# Patient Record
Sex: Male | Born: 1935 | Race: White | Hispanic: No | Marital: Married | Smoking: Never smoker
Health system: Southern US, Community
[De-identification: ages and names within clinical notes are randomized; demographics above are authoritative.]

## PROBLEM LIST (undated history)

## (undated) DIAGNOSIS — D494 Neoplasm of unspecified behavior of bladder: Secondary | ICD-10-CM

## (undated) DIAGNOSIS — R0602 Shortness of breath: Secondary | ICD-10-CM

## (undated) DIAGNOSIS — F329 Major depressive disorder, single episode, unspecified: Secondary | ICD-10-CM

## (undated) DIAGNOSIS — I739 Peripheral vascular disease, unspecified: Secondary | ICD-10-CM

## (undated) DIAGNOSIS — C801 Malignant (primary) neoplasm, unspecified: Secondary | ICD-10-CM

## (undated) DIAGNOSIS — J189 Pneumonia, unspecified organism: Secondary | ICD-10-CM

## (undated) DIAGNOSIS — F419 Anxiety disorder, unspecified: Secondary | ICD-10-CM

## (undated) DIAGNOSIS — I4891 Unspecified atrial fibrillation: Secondary | ICD-10-CM

## (undated) DIAGNOSIS — T884XXA Failed or difficult intubation, initial encounter: Secondary | ICD-10-CM

## (undated) DIAGNOSIS — E7800 Pure hypercholesterolemia, unspecified: Secondary | ICD-10-CM

## (undated) DIAGNOSIS — F32A Depression, unspecified: Secondary | ICD-10-CM

## (undated) DIAGNOSIS — K219 Gastro-esophageal reflux disease without esophagitis: Secondary | ICD-10-CM

## (undated) DIAGNOSIS — M1990 Unspecified osteoarthritis, unspecified site: Secondary | ICD-10-CM

## (undated) DIAGNOSIS — K222 Esophageal obstruction: Secondary | ICD-10-CM

## (undated) DIAGNOSIS — H9190 Unspecified hearing loss, unspecified ear: Secondary | ICD-10-CM

## (undated) DIAGNOSIS — I10 Essential (primary) hypertension: Secondary | ICD-10-CM

## (undated) DIAGNOSIS — I2510 Atherosclerotic heart disease of native coronary artery without angina pectoris: Secondary | ICD-10-CM

## (undated) DIAGNOSIS — R609 Edema, unspecified: Secondary | ICD-10-CM

## (undated) DIAGNOSIS — I2699 Other pulmonary embolism without acute cor pulmonale: Secondary | ICD-10-CM

## (undated) HISTORY — PX: SHOULDER SURGERY: SHX246

## (undated) HISTORY — DX: Essential (primary) hypertension: I10

## (undated) HISTORY — DX: Esophageal obstruction: K22.2

## (undated) HISTORY — DX: Other pulmonary embolism without acute cor pulmonale: I26.99

## (undated) HISTORY — PX: JOINT REPLACEMENT: SHX530

## (undated) HISTORY — DX: Pure hypercholesterolemia, unspecified: E78.00

## (undated) HISTORY — DX: Unspecified atrial fibrillation: I48.91

## (undated) HISTORY — PX: OTHER SURGICAL HISTORY: SHX169

## (undated) HISTORY — PX: TOTAL KNEE ARTHROPLASTY: SHX125

---

## 1998-04-06 ENCOUNTER — Inpatient Hospital Stay (HOSPITAL_COMMUNITY): Admission: EM | Admit: 1998-04-06 | Discharge: 1998-04-07 | Payer: Self-pay | Admitting: Orthopedic Surgery

## 1999-03-21 ENCOUNTER — Encounter: Admission: RE | Admit: 1999-03-21 | Discharge: 1999-03-21 | Payer: Self-pay | Admitting: Orthopedic Surgery

## 1999-03-21 ENCOUNTER — Encounter: Payer: Self-pay | Admitting: Orthopedic Surgery

## 1999-04-08 HISTORY — PX: PANCREAS SURGERY: SHX731

## 1999-12-23 ENCOUNTER — Ambulatory Visit (HOSPITAL_COMMUNITY): Admission: RE | Admit: 1999-12-23 | Discharge: 1999-12-23 | Payer: Self-pay | Admitting: *Deleted

## 2000-01-07 ENCOUNTER — Ambulatory Visit (HOSPITAL_COMMUNITY): Admission: RE | Admit: 2000-01-07 | Discharge: 2000-01-07 | Payer: Self-pay | Admitting: *Deleted

## 2000-01-07 ENCOUNTER — Encounter (INDEPENDENT_AMBULATORY_CARE_PROVIDER_SITE_OTHER): Payer: Self-pay | Admitting: *Deleted

## 2000-02-03 ENCOUNTER — Ambulatory Visit (HOSPITAL_COMMUNITY): Admission: RE | Admit: 2000-02-03 | Discharge: 2000-02-03 | Payer: Self-pay | Admitting: Oncology

## 2000-02-03 ENCOUNTER — Encounter (HOSPITAL_COMMUNITY): Payer: Self-pay | Admitting: Oncology

## 2000-03-27 ENCOUNTER — Encounter: Payer: Self-pay | Admitting: Surgery

## 2000-04-06 ENCOUNTER — Inpatient Hospital Stay (HOSPITAL_COMMUNITY): Admission: RE | Admit: 2000-04-06 | Discharge: 2000-04-19 | Payer: Self-pay | Admitting: Surgery

## 2000-04-06 ENCOUNTER — Encounter (INDEPENDENT_AMBULATORY_CARE_PROVIDER_SITE_OTHER): Payer: Self-pay | Admitting: Specialist

## 2000-04-09 ENCOUNTER — Encounter (HOSPITAL_BASED_OUTPATIENT_CLINIC_OR_DEPARTMENT_OTHER): Payer: Self-pay | Admitting: Internal Medicine

## 2000-04-10 ENCOUNTER — Encounter (HOSPITAL_BASED_OUTPATIENT_CLINIC_OR_DEPARTMENT_OTHER): Payer: Self-pay | Admitting: Internal Medicine

## 2000-04-16 ENCOUNTER — Encounter (HOSPITAL_BASED_OUTPATIENT_CLINIC_OR_DEPARTMENT_OTHER): Payer: Self-pay | Admitting: Internal Medicine

## 2000-04-17 ENCOUNTER — Encounter (HOSPITAL_BASED_OUTPATIENT_CLINIC_OR_DEPARTMENT_OTHER): Payer: Self-pay | Admitting: Internal Medicine

## 2000-04-29 ENCOUNTER — Encounter: Payer: Self-pay | Admitting: General Surgery

## 2000-04-29 ENCOUNTER — Inpatient Hospital Stay (HOSPITAL_COMMUNITY): Admission: EM | Admit: 2000-04-29 | Discharge: 2000-05-05 | Payer: Self-pay | Admitting: Emergency Medicine

## 2000-04-29 ENCOUNTER — Encounter: Payer: Self-pay | Admitting: Emergency Medicine

## 2000-05-04 ENCOUNTER — Encounter: Payer: Self-pay | Admitting: Surgery

## 2000-12-29 ENCOUNTER — Ambulatory Visit (HOSPITAL_COMMUNITY): Admission: RE | Admit: 2000-12-29 | Discharge: 2000-12-29 | Payer: Self-pay | Admitting: *Deleted

## 2001-01-01 ENCOUNTER — Encounter: Admission: RE | Admit: 2001-01-01 | Discharge: 2001-01-01 | Payer: Self-pay | Admitting: Surgery

## 2001-01-01 ENCOUNTER — Encounter: Payer: Self-pay | Admitting: Surgery

## 2001-01-01 ENCOUNTER — Ambulatory Visit (HOSPITAL_COMMUNITY): Admission: RE | Admit: 2001-01-01 | Discharge: 2001-01-01 | Payer: Self-pay | Admitting: Surgery

## 2001-03-29 ENCOUNTER — Encounter: Admission: RE | Admit: 2001-03-29 | Discharge: 2001-03-29 | Payer: Self-pay | Admitting: Surgery

## 2001-03-29 ENCOUNTER — Encounter: Payer: Self-pay | Admitting: Surgery

## 2001-04-09 ENCOUNTER — Encounter: Payer: Self-pay | Admitting: Surgery

## 2001-04-09 ENCOUNTER — Ambulatory Visit (HOSPITAL_COMMUNITY): Admission: RE | Admit: 2001-04-09 | Discharge: 2001-04-09 | Payer: Self-pay | Admitting: Surgery

## 2001-04-09 ENCOUNTER — Encounter (INDEPENDENT_AMBULATORY_CARE_PROVIDER_SITE_OTHER): Payer: Self-pay | Admitting: *Deleted

## 2002-01-21 ENCOUNTER — Encounter (INDEPENDENT_AMBULATORY_CARE_PROVIDER_SITE_OTHER): Payer: Self-pay | Admitting: Gastroenterology

## 2002-03-07 ENCOUNTER — Encounter: Admission: RE | Admit: 2002-03-07 | Discharge: 2002-03-07 | Payer: Self-pay | Admitting: Surgery

## 2002-03-07 ENCOUNTER — Encounter: Payer: Self-pay | Admitting: Surgery

## 2002-11-16 ENCOUNTER — Encounter: Admission: RE | Admit: 2002-11-16 | Discharge: 2002-11-16 | Payer: Self-pay | Admitting: Internal Medicine

## 2002-11-16 ENCOUNTER — Encounter: Payer: Self-pay | Admitting: Internal Medicine

## 2003-03-10 ENCOUNTER — Encounter: Admission: RE | Admit: 2003-03-10 | Discharge: 2003-03-10 | Payer: Self-pay | Admitting: Surgery

## 2003-04-08 DIAGNOSIS — J189 Pneumonia, unspecified organism: Secondary | ICD-10-CM

## 2003-04-08 HISTORY — DX: Pneumonia, unspecified organism: J18.9

## 2004-01-13 ENCOUNTER — Inpatient Hospital Stay (HOSPITAL_COMMUNITY): Admission: EM | Admit: 2004-01-13 | Discharge: 2004-01-16 | Payer: Self-pay | Admitting: Emergency Medicine

## 2004-02-12 ENCOUNTER — Ambulatory Visit: Payer: Self-pay | Admitting: Gastroenterology

## 2004-03-11 ENCOUNTER — Encounter: Admission: RE | Admit: 2004-03-11 | Discharge: 2004-03-11 | Payer: Self-pay | Admitting: Surgery

## 2004-06-22 ENCOUNTER — Ambulatory Visit: Payer: Self-pay | Admitting: Gastroenterology

## 2004-06-22 ENCOUNTER — Encounter (INDEPENDENT_AMBULATORY_CARE_PROVIDER_SITE_OTHER): Payer: Self-pay | Admitting: Gastroenterology

## 2004-06-22 ENCOUNTER — Ambulatory Visit (HOSPITAL_COMMUNITY): Admission: RE | Admit: 2004-06-22 | Discharge: 2004-06-22 | Payer: Self-pay | Admitting: Gastroenterology

## 2004-07-03 ENCOUNTER — Ambulatory Visit: Payer: Self-pay | Admitting: Gastroenterology

## 2004-07-10 ENCOUNTER — Ambulatory Visit: Payer: Self-pay | Admitting: Gastroenterology

## 2004-07-10 ENCOUNTER — Ambulatory Visit (HOSPITAL_COMMUNITY): Admission: RE | Admit: 2004-07-10 | Discharge: 2004-07-10 | Payer: Self-pay | Admitting: Gastroenterology

## 2004-12-23 ENCOUNTER — Emergency Department (HOSPITAL_COMMUNITY): Admission: EM | Admit: 2004-12-23 | Discharge: 2004-12-24 | Payer: Self-pay | Admitting: Emergency Medicine

## 2005-04-07 HISTORY — PX: LUNG SURGERY: SHX703

## 2005-06-09 ENCOUNTER — Encounter: Admission: RE | Admit: 2005-06-09 | Discharge: 2005-06-09 | Payer: Self-pay | Admitting: Internal Medicine

## 2005-06-17 ENCOUNTER — Ambulatory Visit: Payer: Self-pay | Admitting: Internal Medicine

## 2005-06-24 ENCOUNTER — Encounter (INDEPENDENT_AMBULATORY_CARE_PROVIDER_SITE_OTHER): Payer: Self-pay | Admitting: *Deleted

## 2005-06-24 ENCOUNTER — Ambulatory Visit: Payer: Self-pay | Admitting: Internal Medicine

## 2005-06-24 ENCOUNTER — Other Ambulatory Visit: Admission: RE | Admit: 2005-06-24 | Discharge: 2005-06-24 | Payer: Self-pay | Admitting: Internal Medicine

## 2005-07-09 ENCOUNTER — Ambulatory Visit: Payer: Self-pay | Admitting: Internal Medicine

## 2005-07-29 ENCOUNTER — Encounter: Admission: RE | Admit: 2005-07-29 | Discharge: 2005-07-29 | Payer: Self-pay | Admitting: Thoracic Surgery

## 2005-08-04 ENCOUNTER — Encounter (INDEPENDENT_AMBULATORY_CARE_PROVIDER_SITE_OTHER): Payer: Self-pay | Admitting: *Deleted

## 2005-08-04 ENCOUNTER — Inpatient Hospital Stay (HOSPITAL_COMMUNITY): Admission: RE | Admit: 2005-08-04 | Discharge: 2005-08-12 | Payer: Self-pay | Admitting: Thoracic Surgery

## 2005-08-19 ENCOUNTER — Encounter: Admission: RE | Admit: 2005-08-19 | Discharge: 2005-08-19 | Payer: Self-pay | Admitting: Thoracic Surgery

## 2005-09-10 ENCOUNTER — Encounter: Admission: RE | Admit: 2005-09-10 | Discharge: 2005-09-10 | Payer: Self-pay | Admitting: Thoracic Surgery

## 2005-10-07 ENCOUNTER — Encounter: Admission: RE | Admit: 2005-10-07 | Discharge: 2005-10-07 | Payer: Self-pay | Admitting: Thoracic Surgery

## 2006-01-06 ENCOUNTER — Encounter: Admission: RE | Admit: 2006-01-06 | Discharge: 2006-01-06 | Payer: Self-pay | Admitting: Thoracic Surgery

## 2006-04-07 DIAGNOSIS — I2699 Other pulmonary embolism without acute cor pulmonale: Secondary | ICD-10-CM

## 2006-04-07 HISTORY — PX: CARDIAC CATHETERIZATION: SHX172

## 2006-04-07 HISTORY — DX: Other pulmonary embolism without acute cor pulmonale: I26.99

## 2006-04-08 ENCOUNTER — Ambulatory Visit: Payer: Self-pay | Admitting: Gastroenterology

## 2006-07-08 ENCOUNTER — Ambulatory Visit: Payer: Self-pay | Admitting: Thoracic Surgery

## 2006-07-08 ENCOUNTER — Encounter: Admission: RE | Admit: 2006-07-08 | Discharge: 2006-07-08 | Payer: Self-pay | Admitting: Thoracic Surgery

## 2006-07-29 IMAGING — CR DG CHEST 2V
2 series · 2 of 2 positions shown · non-contrast
Comparison: 08/19/05.

CLINICAL DATA: Pleural effusion, follow-up.
 CHEST X-RAY:

[view not recorded (1 of 2)]
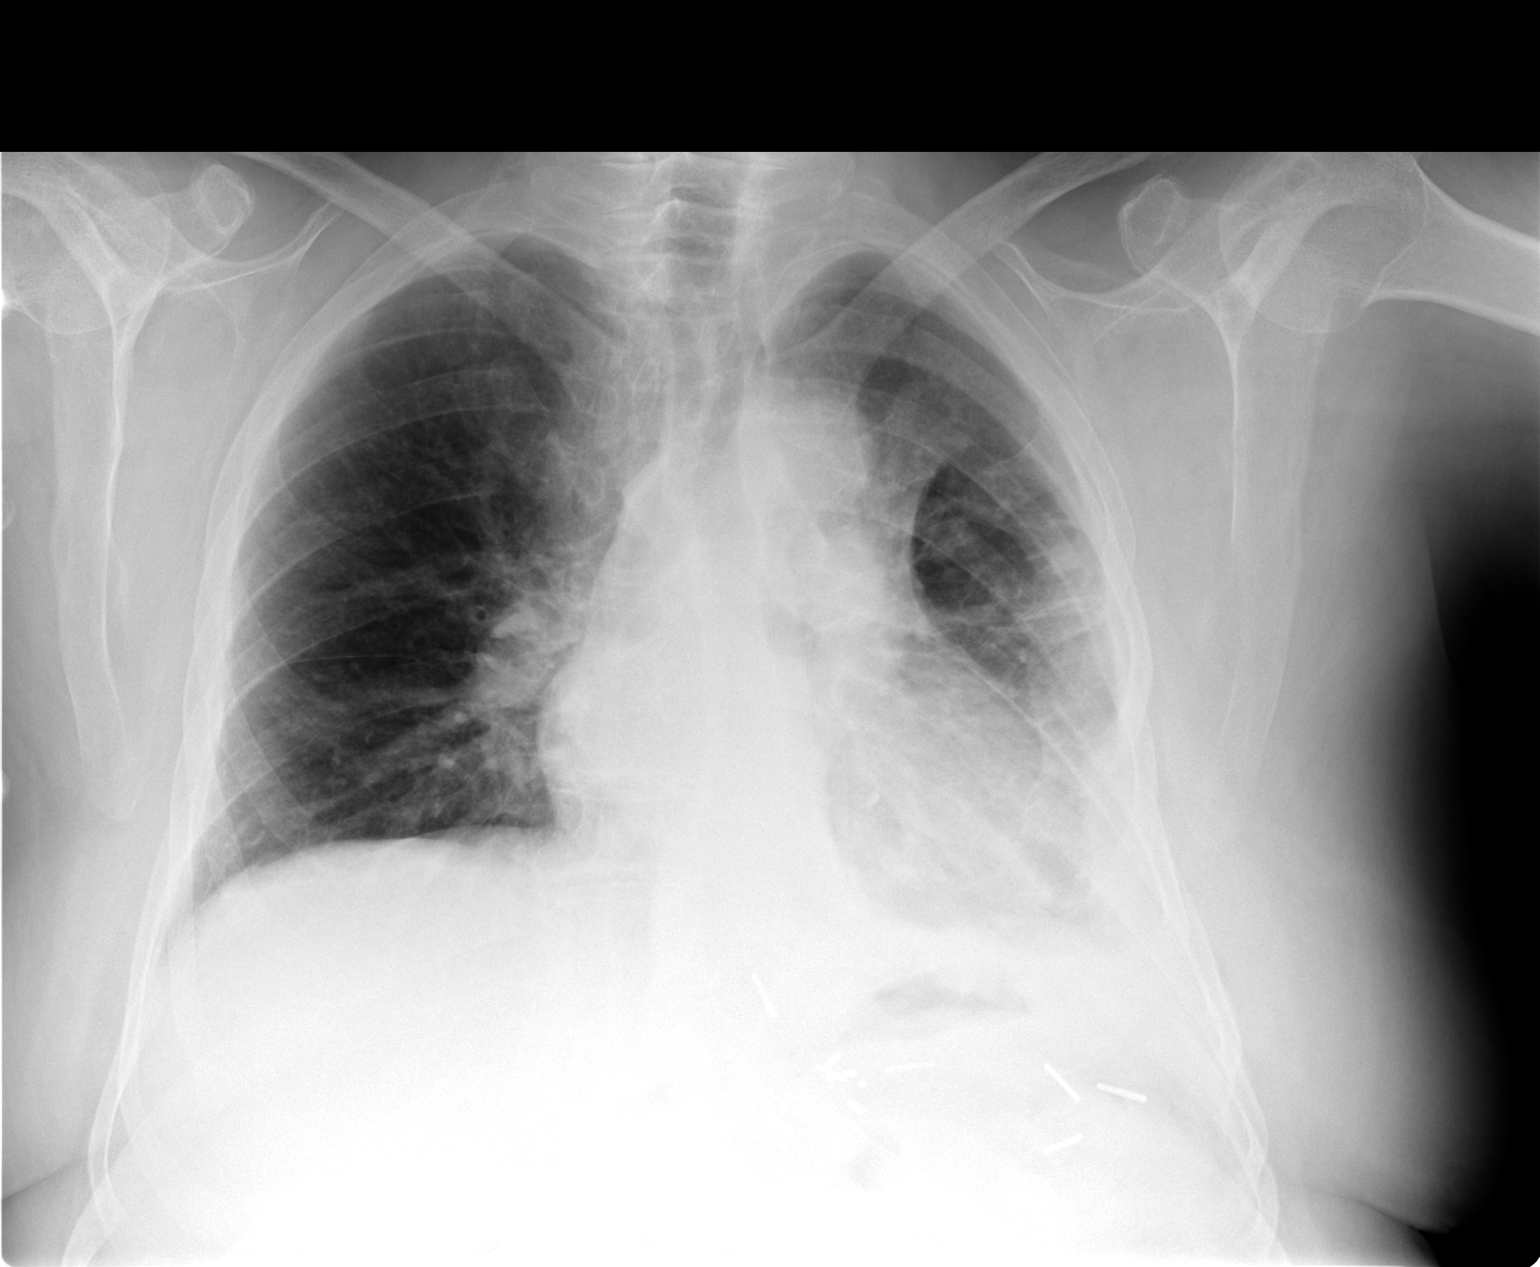

[view not recorded (2 of 2)]
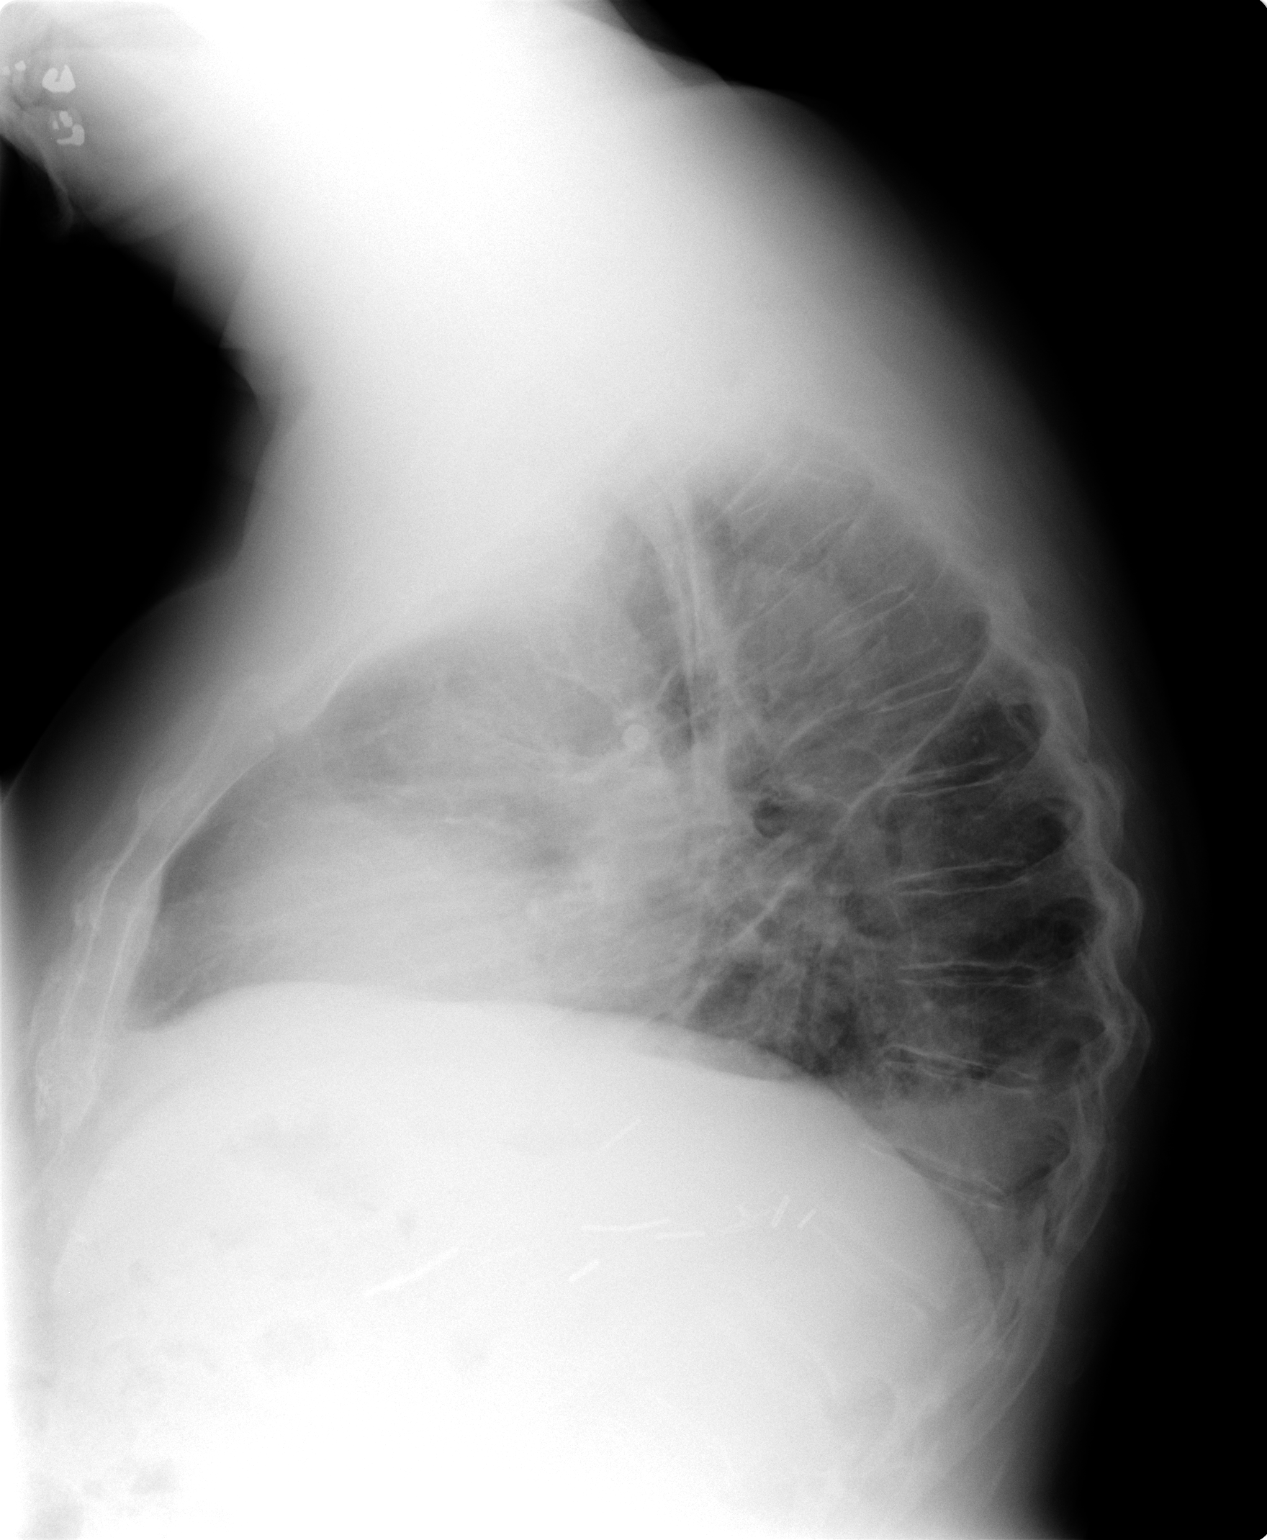

[2 of 2 positions shown; findings below may reference images not displayed]

The pleural-based opacity at the left lung base is decreasing.  Pleuroparenchymal changes on the left remain postoperatively. The right lung is clear.  Heart size is stable.
IMPRESSION: Decreasing pleural-based opacity at the left base.  Otherwise stable pleuroparenchymal changes on the left postoperatively.

## 2006-12-13 ENCOUNTER — Encounter: Payer: Self-pay | Admitting: Emergency Medicine

## 2006-12-14 ENCOUNTER — Encounter (INDEPENDENT_AMBULATORY_CARE_PROVIDER_SITE_OTHER): Payer: Self-pay | Admitting: Internal Medicine

## 2006-12-14 ENCOUNTER — Inpatient Hospital Stay (HOSPITAL_COMMUNITY): Admission: EM | Admit: 2006-12-14 | Discharge: 2006-12-18 | Payer: Self-pay | Admitting: Emergency Medicine

## 2007-02-22 ENCOUNTER — Ambulatory Visit: Payer: Self-pay | Admitting: Gastroenterology

## 2007-04-30 ENCOUNTER — Encounter: Admission: RE | Admit: 2007-04-30 | Discharge: 2007-04-30 | Payer: Self-pay | Admitting: Orthopedic Surgery

## 2007-05-12 DIAGNOSIS — E785 Hyperlipidemia, unspecified: Secondary | ICD-10-CM

## 2007-05-12 DIAGNOSIS — C649 Malignant neoplasm of unspecified kidney, except renal pelvis: Secondary | ICD-10-CM | POA: Insufficient documentation

## 2007-05-12 DIAGNOSIS — I10 Essential (primary) hypertension: Secondary | ICD-10-CM | POA: Insufficient documentation

## 2007-05-12 DIAGNOSIS — E119 Type 2 diabetes mellitus without complications: Secondary | ICD-10-CM

## 2007-05-12 DIAGNOSIS — K2990 Gastroduodenitis, unspecified, without bleeding: Secondary | ICD-10-CM

## 2007-05-12 DIAGNOSIS — J90 Pleural effusion, not elsewhere classified: Secondary | ICD-10-CM | POA: Insufficient documentation

## 2007-05-12 DIAGNOSIS — K2970 Gastritis, unspecified, without bleeding: Secondary | ICD-10-CM | POA: Insufficient documentation

## 2007-05-12 DIAGNOSIS — T18108A Unspecified foreign body in esophagus causing other injury, initial encounter: Secondary | ICD-10-CM | POA: Insufficient documentation

## 2007-05-12 DIAGNOSIS — K209 Esophagitis, unspecified without bleeding: Secondary | ICD-10-CM | POA: Insufficient documentation

## 2007-05-12 DIAGNOSIS — K5730 Diverticulosis of large intestine without perforation or abscess without bleeding: Secondary | ICD-10-CM | POA: Insufficient documentation

## 2007-05-12 DIAGNOSIS — D126 Benign neoplasm of colon, unspecified: Secondary | ICD-10-CM | POA: Insufficient documentation

## 2007-05-12 DIAGNOSIS — C259 Malignant neoplasm of pancreas, unspecified: Secondary | ICD-10-CM | POA: Insufficient documentation

## 2007-05-12 DIAGNOSIS — K219 Gastro-esophageal reflux disease without esophagitis: Secondary | ICD-10-CM | POA: Insufficient documentation

## 2007-05-12 DIAGNOSIS — K222 Esophageal obstruction: Secondary | ICD-10-CM

## 2007-05-12 DIAGNOSIS — K449 Diaphragmatic hernia without obstruction or gangrene: Secondary | ICD-10-CM | POA: Insufficient documentation

## 2007-05-12 DIAGNOSIS — M1990 Unspecified osteoarthritis, unspecified site: Secondary | ICD-10-CM | POA: Insufficient documentation

## 2008-01-25 ENCOUNTER — Telehealth: Payer: Self-pay | Admitting: Gastroenterology

## 2008-03-13 ENCOUNTER — Ambulatory Visit: Payer: Self-pay | Admitting: Internal Medicine

## 2008-03-13 DIAGNOSIS — J9610 Chronic respiratory failure, unspecified whether with hypoxia or hypercapnia: Secondary | ICD-10-CM

## 2008-04-13 ENCOUNTER — Ambulatory Visit: Payer: Self-pay | Admitting: Internal Medicine

## 2009-02-13 ENCOUNTER — Encounter: Payer: Self-pay | Admitting: Gastroenterology

## 2009-12-31 ENCOUNTER — Encounter: Payer: Self-pay | Admitting: Gastroenterology

## 2010-05-09 NOTE — Miscellaneous (Signed)
  Clinical Lists Changes Prescription renal notice for Prevacid 30 mg tablets one tablet daily #90 with signs and symptoms to the patient  Appended Document:     Clinical Lists Changes  Medications: Changed medication from PREVACID 30 MG CPDR (LANSOPRAZOLE) 1 tablet by mouth once daily every morning before breakfast to PREVACID 30 MG CPDR (LANSOPRAZOLE) 1 tablet by mouth once daily every morning before breakfast - Signed Rx of PREVACID 30 MG CPDR (LANSOPRAZOLE) 1 tablet by mouth once daily every morning before breakfast;  #90 x 3;  Signed;  Entered by: Merri Ray CMA (AAMA);  Authorized by: Louis Meckel MD;  Method used: Print then Give to Patient    Prescriptions: PREVACID 30 MG CPDR (LANSOPRAZOLE) 1 tablet by mouth once daily every morning before breakfast  #90 x 3   Entered by:   Merri Ray CMA (AAMA)   Authorized by:   Louis Meckel MD   Signed by:   Merri Ray CMA (AAMA) on 12/31/2009   Method used:   Print then Give to Patient   RxID:   [id number]

## 2010-08-20 NOTE — Assessment & Plan Note (Signed)
[facility]                         GASTROENTEROLOGY OFFICE NOTE   Frederick [name], Frederick [name]                     MRN:          [id number]  DATE:02/22/2007                            DOB:          04-14-1929    PROBLEM:  Esophageal stricture.   Frederick [name] has returned for scheduled GI followup.  He has a history  of an esophageal stricture which has been dilated in the past.  He has  had food impactions as well.  On Prevacid, he has been symptom-free.  With careful chewing of his food, he has had no further problems.  He is  now oxygen dependent because of his COPD.  He has a history of a pleural  effusion and underwent what sounds like a VATS procedure.   MEDICATIONS:  Include Celebrex, Lipitor, Lasix, hydrocodone, Benicar,  Glucotrol, potassium, amitriptyline, chlordiazepoxide, Imdur, pindolol,  Norvasc, insulin, Coumadin, metolazone, Prevacid, and meclizine.  He is  allergic to DILAUDID and MORPHINE.   EXAMINATION:  Pulse 62, blood pressure 120/70, weight 231.   IMPRESSION:  1. History of esophageal motility disorder and stricture.  Currently      asymptomatic.  2. Gastroesophageal reflux disease.  3. Chronic obstructive pulmonary disease, oxygen-requiring chronic      obstructive pulmonary disease.  4. History of pancreatic islet cell tumor.  5. Diabetes.   RECOMMENDATIONS:  1. Continue PPI therapy, which he should use indefinitely.  2. Repeat dilatation as needed.     Barbette Hair. Arlyce Dice, MD,FACG  Electronically Signed    RDK/MedQ  DD: 02/22/2007  DT: 02/23/2007  Job #: [id number]

## 2010-08-20 NOTE — H&P (Signed)
NAME:  Frederick [name], Frederick [name]            ACCOUNT NO.:  [id number]   MEDICAL RECORD NO.:  [id number]          PATIENT TYPE:  INP   LOCATION:  3733                         FACILITY:  [facility]   PHYSICIAN:  Wilson Singer, M.D.DATE OF BIRTH:  Aug 16, 1929   DATE OF ADMISSION:  12/14/2006  DATE OF DISCHARGE:                              HISTORY & PHYSICAL   HISTORY:  This is a 77 year old obese man who gives a 2 to 3 day history  of feeling weak and short of breath.  He had accompanied his wife to his  wife's doctor's appointment today and whilst in the waiting room he  became somewhat drowsy and apparently he had desaturated when they  checked his oxygen level.  His oxygen had been started only a few days  ago by his primary care physician because he was hypoxic.  He was  therefore sent to the hospital for further evaluation.   PAST MEDICAL HISTORY:  1. Hypertension.  2. Insulin dependent diabetes mellitus.  3. Hiatal hernia.  4. Gastroesophageal reflux disease.  5. History of esophageal stricture.  6. History of benign colonic polyps.  7. History of left pleural effusion treated with left thoracotomy and      left lung decortication in April 2007.   PAST SURGICAL HISTORY:  1. Status post resection of islet cell pancreatic tumor in 2002.  2. Status post splenectomy 2002 at the same operation.  3. Bilateral total knee replacements 1988.  4. Bilateral shoulder surgery for rotator cuff problems 1992.   SOCIAL HISTORY:  Has been married for 55 years.  He does not smoke and  does not drink alcohol.  He is retired.   MEDICATIONS:  1. Elavil 50 mg nightly.  2. Aspirin 325 mg daily.  3. Benicar 40 mg daily.  4. Celebrex 100 mg b.i.d.  5. Furosemide 40 mg daily.  6. Glipizide 10 mg b.i.d.  7. Imdur 60 mg daily.  8. Lantus insulin 44 units daily.  9. Librium 10 mg t.i.d.  10.Lipitor 40 mg daily.  11.Pindolol 5 mg daily.  12.Norvasc 5 mg daily.  13.Potassium chloride 20 mEq daily.  14.Humalog insulin as needed per sliding scale.   ALLERGIES:  DILAUDID AND MORPHINE.   REVIEW OF SYSTEMS:  Apart from the systems mentioned above, there are no  other symptoms referable to all systems reviewed.   PHYSICAL EXAMINATION:  VITAL SIGNS:  Temperature 97.2, blood pressure  89/43, pulse 50, saturation 92% on 4 liters of oxygen, respiratory rate  14.  GENERAL:  The patient appears to be drowsy intermittently.  CARDIOVASCULAR:  Heart sounds are present to normal without any murmurs.  He has a resting bradycardia and appears to be in first degree heart  block.  RESPIRATORY:  Lung fields are clear.  Clinically there is no pleural  rub.  ABDOMEN:  Soft, nontender, with no hepatomegaly.  NEUROLOGICAL:  He is alert intermittently.  He does answer questions  appropriately when he is awake.  There is no obvious focal neurological  signs.   INVESTIGATIONS:  Sodium 137, potassium 4.4, BUN 29, chloride 102,  glucose 167, creatinine 1.1, troponin  less than 0.05.  D-dimer is  significantly elevated at 10.24, BNP 401.   IMPRESSION:  1. Hypoxemia possible pulmonary embolism.  2. Hypotension.  3. Bradycardia.  4. Insulin dependent diabetes mellitus.  5. Hypertension.  6. History of islet cell tumor resection.   PLAN:  1. Admit.  2. CT chest angiogram.  3. Intravenous fluids.  4. Hold beta blocker.  5. Control diabetes mellitus.   Further recommendations will depend on patient's hospital progress.      Wilson Singer, M.D.  Electronically Signed     NCG/MEDQ  D:  12/14/2006  T:  12/14/2006  Job:  [id number]   cc:   Georgianne Fick, M.D.

## 2010-08-23 NOTE — Op Note (Signed)
NAMEKENNEITH, STIEF            ACCOUNT NO.:  [id number]   MEDICAL RECORD NO.:  [id number]          PATIENT TYPE:  INP   LOCATION:  3301                         FACILITY:  [facility]   PHYSICIAN:  Ines Bloomer, M.D. DATE OF BIRTH:  1930-02-20   DATE OF PROCEDURE:  08/04/2005  DATE OF DISCHARGE:                                 OPERATIVE REPORT   PREOPERATIVE DIAGNOSIS:  Chronic left pleural effusion, rule out cancer.   POSTOPERATIVE DIAGNOSIS:  Fibrothorax.   OPERATION PERFORMED:  Left video-assisted thoracoscopic surgery, left  thoracotomy with decortication.   SURGEON:  Ines Bloomer, M.D.   FIRST ASSISTANT:  Pecola Leisure, P.A.-C.   General anesthesia.   After percutaneous insertion of all monitoring lines, the patient underwent  general anesthesia.  He was prepped and draped in the usual sterile manner,  turned to the left lateral thoracotomy position.  A dual-lumen tube was  inserted.  The left lung was deflated.  Two trocar sites were made in the  anterior and posterior axillary line at the seventh intercostal space and  two trocars were inserted.  A 0 degree scope was inserted.  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  We did multiple  biopsies to rule out any cancer and that just came back inflammatory, so a  posterolateral thoracotomy incision was made over the sixth intercostal  space with the latissimus being partially divided, the serratus reflected  anteriorly, and the sixth intercostal space entered.  Two Tuffiers were  placed at right angles.  We started dissecting off the thickened 2-3 mm  parietal pleura superiorly, then inferiorly and then medially.  The left  upper lobe was fairly easily, except for the lingula, dissected off the  parietal pleura and the thickened pleura was removed.  Then the lingula had  to be decorticated, stripping off the  thickened visceral pleura off of the  lingula with sharp and blunt dissection using Kitners and forceps to remove  it.  Then attention was turned to the left lower lobe, where there was  markedly thickened parietal and visceral pleura.  The parietal pleura was  dissected free down to the diaphragm, then superiorly, and the lung was  dissected off the diaphragm and the thickened parietal pleura was removed  laterally and then it was removed medially off the pericardium.  Then the  thickened visceral pleura was removed off the left lower lobe, again with  sharp and blunt dissection using Kitners and scissors and forceps until the  entire visceral pleura was stripped off the left lower lobe.  Then one area  of a tear on the lung was sutured with 3-0 Vicryl.  After this had been  done, two chest tubes were brought through the trocar sites, a straight 36  and a right-angled 36, and  sutured in place with 0 silk.  The chest was closed with four paracostals of  #1 Vicryl.  The lung was expanded and filled the space.  The muscle was  closed with #1  Vicryl also, the subcutaneous tissue with 2-0 Vicryl and  Ethicon skin clips.  The patient was returned to the recovery room in  serious condition.           ______________________________  Ines Bloomer, M.D.     DPB/MEDQ  D:  08/04/2005  T:  08/05/2005  Job:  [id number]   cc:   Casimiro Needle B. Sherene Sires, M.D. [facility]  [address]  [place]  [place] [address]

## 2010-08-23 NOTE — H&P (Signed)
[facility]  Patient:    Frederick [name], Frederick [name]                   MRN: [id number] Adm. Date:  [id number] Disc. Date: [id number] Attending:  Bonnetta Barry CC:         Lilia Pro, M.D.   History and Physical  CHIEF COMPLAINT:  Left upper quadrant abdominal pain and fever.  HISTORY OF PRESENT ILLNESS:  This is a 77 year old white man who recently underwent elective distal pancreatectomy and splenectomy on April 06, 2000, by Velora Heckler, M.D., for an islet cell tumor.  The patient did reasonably well and was discharged on April 19, 2000.  Prior to discharge, his white count was noted to be slightly elevated and a CT scan showed some benign-appearing fluid collections.  He now complains of headache for several days and left-sided abdominal pain for three to five days and a one to two-day history of fever up to 101.7 degrees.  He has had some mild chills, nothing severe.  His appetite has been diminished, but he has had normal bowel movements and no nausea or vomiting.  He denies cough, sputum production, or chest pain.  He noticed some redness and drainage from the wound just today.  He was evaluated by Donnetta Hutching, M.D., in the emergency department and I was asked to see him.  PAST MEDICAL HISTORY:  Diabetes mellitus, recent onset following his pancreatectomy.  Hypertension.  Gastroesophageal reflux disease and esophageal stricture.  Cardiac arrhythmias.  History of colon polyps.  PAST SURGICAL HISTORY:  History of bilateral total knee replacements in 1988. History of bilateral shoulder surgery in 1992.  Pancreatectomy on April 06, 2000.  CURRENT MEDICATIONS:  1. Actos one tablet p.o. q.d.  2. Amitriptyline 25 mg p.o. q.d.  3. Librium 10 mg p.o. b.i.d.  4. Prevacid 30 mg p.o. q.d.  5. Lotensin 20 mg p.o. q.d.  6. Meclozine p.r.n.  7. Lasix 40 mg q.a.m.  8. Lipitor 10 mg p.o. q.d.  9. Celebrex 100 mg b.i.d. 10. Quinidine 200 mg  q.i.d. 11. Vicodin p.r.n.  DRUG ALLERGIES:  MORPHINE.  FAMILY HISTORY:  For details, please see the recent admission note.  SOCIAL HISTORY:  For details, please see the recent admission note.  REVIEW OF SYSTEMS:  For details, please see the recent admission note.  PHYSICAL EXAMINATION:  A pleasant, overweight, white male in mild distress.  VITAL SIGNS:  Temperature 98.7 degrees, heart rate 81, respiratory rate 20, blood pressure 103/58.  HEENT:  Sclerae clear.  Extraocular movements intact.  NECK:  Supple.  Nontender.  No mass.  No adenopathy.  LUNGS:  Faint scattered rhonchi bilaterally.  Cough is nonproductive.  HEART:  Regular rate and rhythm with a faint systolic murmur.  ABDOMEN:  Obese and soft with active bowel sounds.  Bilateral subcostal scar. Looks well healed, but in the left upper quadrant there is faint erythema and moderate induration.  With sterile prep, this wound was opened laterally for about 1 inch and probed and explored all the way down to the fascia.  I did not find an abscess.  A culture was taken.  This was redressed.  GENITALIA:  The inguinal area was normal.  EXTREMITIES:  No edema.  NEUROLOGIC:  Grossly within normal limits.  ADMISSION LABORATORY DATA:  The chest x-ray looks fairly normal, but there is a question of a faint left lower lobe infiltrate behind the heart.  White blood cell count 16,000, hemoglobin 10.3.  Glucose 286, potassium 4.1, creatinine 1.1, calcium 9.0.  Liver function tests normal.  IMPRESSION: 1. Cellulitis of the abdominal wall, rule out left upper quadrant or left    subphrenic abscess. 2. Recent distal pancreatectomy and splenectomy for islet cell tumor. 3. Diabetes mellitus. 4. Hypertension. 5. Gastroesophageal reflux disease. 6. Possible left lower lobe infiltrate.  PLAN:  The patient will be admitted and started on IV fluids and IV antibiotics.  A CT scan of the lower chest and abdomen will be performed now. We  will cover him with sliding scale insulin.  Lilia Pro, M.D., will be asked to see him in consultation regarding his multiple medical problems. DD:  04/29/00 TD:  04/29/00 Job: [id number]

## 2010-08-23 NOTE — Discharge Summary (Signed)
Frederick [name], Frederick [name]            ACCOUNT NO.:  [id number]   MEDICAL RECORD NO.:  [id number]          PATIENT TYPE:  INP   LOCATION:  2017                         FACILITY:  [facility]   PHYSICIAN:  Ines Bloomer, M.D. DATE OF BIRTH:  May 27, 1929   DATE OF ADMISSION:  08/04/2005  DATE OF DISCHARGE:  08/12/2005                                 DISCHARGE SUMMARY   PRIMARY ADMITTING DIAGNOSIS:  Left pleural effusion.   ADDITIONAL/DISCHARGE DIAGNOSES:  1.  Chronic left pleural effusion.  2.  Fibrothorax.  3.  Type 2 diabetes mellitus.  4.  Hypertension.  5.  History of hiatal hernia.  6.  Status post resection of an islet cell tumor.  7.  Postoperative blood loss anemia.   PROCEDURES PERFORMED:  1.  Left VATS.  2.  Left thoracotomy with left lung decortication.   HISTORY:  The patient is a 77 year old male who was status post resection of  an islet cell tumor several years ago.  He had done well until several  months ago, at which time he developed a left pleural effusion.  He has  continued to have some shortness of breath and anterior chest wall pain  since that time, and a CT scan was performed which showed recurrence of the  left pleural effusion with thickened pleura.  The previous tap showed a  bloody effusion was a lymphocytosis.  He was subsequently referred to Dr.  Dewayne Shorter for further evaluation and treatment.  Because of this recurrence  of his effusion and the thickened nature on CT scan, Dr. Edwyna Shell recommended  proceeding with a VATS with drainage of the effusion and biopsies at this  time.  He explained the risks, benefits and alternatives of the procedure to  the patient, and he agreed to proceed with surgery.   HOSPITAL COURSE:  Mr. Mahl was admitted to [facility] on August 04, 2005.  He was taken to the operating room where he underwent left VATS  plus thoracotomy and decortication.  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  The patient tolerated the procedure well and was  transferred to the floor in stable condition.  Postoperatively, he initially  ran low grade fevers and developed a leukocytosis with white blood cell  count up to 17,000.  He also had a productive cough and was started  empirically on IV Maxipime.  He was mobilized with physical therapy.  His  chest tubes were removed in a stepwise fashion once all drainage had  resolved and no evidence of pneumothorax was present on chest x-ray.  He has  been somewhat anemic postoperatively and has been started on supplemental  iron and folic acid.  He has been treated with aggressive pulmonary toilet  measures.  He is presently afebrile and all vital signs are stable.  He has  been weaned from supplemental oxygen and is maintaining O2 sats of greater  than 90% on room air.  He has been switched from IV antibiotics to p.o.  Ceftin and has completed 3 days  of a 5-day course.  He continues to have a  leukocytosis with a white blood cell count of 17,000.  His chest x-rays are  improving with bilateral atelectasis and mild edema.  His surgical incision  sites were all healing well, and he has no other signs on physical exam of  infection.  He is ambulating in the halls without difficulty.  The remainder  of his labs showed a hemoglobin of 10, hematocrit 31.2, platelets 554,  sodium 142, potassium 3.7 which has been supplemented, BUN 12, creatinine  0.9.  A CBC will be repeated on Aug 12, 2005 to recheck his white blood cell  count.  Overall, he is progressing well, and it is anticipated that if he  has no acute changes over the next 24-48 hours, he will hopefully be ready  for discharge home.   DISCHARGE MEDICATIONS:  1.  Nu-Iron 150 mg daily.  2.  Folic acid 1 mg daily.  3.  Ceftin 500 mg b.i.d. x2 days.  4.  Darvocet N 100 one to two q.4 h p.r.n. for pain.  5.  Lantus 36 units  daily.  6.  Prevacid 30 mg daily.  7.  Celebrex 100 mg b.i.d.  8.  Quinidine 200 mg b.i.d.  9.  Lipitor 20 mg daily.  10. Benicar 20 mg daily.  11. Glipizide 10 mg b.i.d.  12. Amitriptyline 25 mg 2 tablets q.h.s.  13. Lasix 40 mg q.a.m. and 20 mg q.p.m.  14. Potassium 20 mEq daily.  15. Librium 10 mg t.i.d.  16. Antivert p.r.n.  17. Sliding scale insulin as directed.   DISCHARGE INSTRUCTIONS:  He is asked to refrain from driving, heavy lifting  or strenuous activity.  He may continue ambulating daily and using his  incentive spirometer.  He may shower daily and clean his incisions with soap  and water.  He will continue his same preoperative diet.   DISCHARGE FOLLOWUP:  He will see Dr. Edwyna Shell back in the office in 1 week and  will have a chest x-ray at [facility] 1 hour prior to this  appointment.  He will contact our office in the interim if he experiences  any problems or has questions.      Coral Ceo, P.A.    ______________________________  Ines Bloomer, M.D.    GC/MEDQ  D:  08/11/2005  T:  08/12/2005  Job:  [id number]   cc:   [facility] Office   Casimiro Needle B. Sherene Sires, M.D. [facility]  [address]  [place]  [place] [address]   Georgianne Fick, M.D.  Fax: [phone]

## 2010-08-23 NOTE — Op Note (Signed)
[facility]  Patient:    Frederick [name], Frederick [name]                   MRN: [id number] Proc. Date: 04/06/00 Adm. Date:  [id number] Attending:  Bonnetta Barry CC:         Samul Dada, M.D.  Thomas B. Samuella Cota, M.D.  Lilia Pro, M.D.   Operative Report  PREOPERATIVE DIAGNOSIS:  Islet cell tumor of the pancreas.  POSTOPERATIVE DIAGNOSIS:  Islet cell tumor of the pancreas.  PROCEDURE:  Distal pancreatectomy and splenectomy.  SURGEON:  Velora Heckler, M.D.  ASSISTANT:  Donnie Coffin. Samuella Cota, M.D.  ANESTHESIA:  General with postoperative epidural.  ESTIMATED BLOOD LOSS:  1250 cc.  PREPARATION:  Betadine.  COMPLICATIONS:  None.  BLOOD PRODUCTS:  Two units packed red blood cells.  DRAINS:  #10 Jackson-Pratt drain.  INDICATIONS:  Patient is a 77 year old white male, who presents with an incidentally identified tumor of the pancreas.  Patient was undergoing a CT scan to evaluate for renal artery stenosis and hypertension.  Subsequent MRI scan was performed.  This showed a mass lesion in the tail of the pancreas. Needle aspiration was obtained, which was consistent with islet cell neoplasm of the pancreas.  A ______ scan was performed and was negative for metastatic disease.  Patient now comes to surgery for resection of pancreatic islet cell neoplasm.  DESCRIPTION OF PROCEDURE:  Procedure was done in O.R. #11 at the [facility].  Patient was brought to the operating room, placed in the supine position on the operating room table.  Following administration of general anesthesia, the patient was prepped and draped in usual strict aseptic fashion.  After ascertaining that an adequate level of anesthesia had been obtained, a left subcostal incision was made with a #10-blade.  Dissection was carried down through the subcutaneous tissues in the abdominal wall. Hemostasis was obtained with the electrocautery.  Peritoneal cavity  was entered cautiously.  Abdomen was explored.  There is an abundance of intraperitoneal and retroperitoneal adipose tissue.  No palpable lesions were identifiable in the spleen or liver.  Gallbladder was normal without stones. Stomach appears grossly normal and the nasogastric tube was properly positioned.  Small bowel was grossly normal.  There were some adhesions to the sigmoid colon representing mild underlying diverticular disease.  There were no mass lesions in the pelvis.  A Bookwalter retractor was placed to assist with exposure.   Dissection was begun by dividing the gastrocolic ligament between Kelly clamps and ligating with 2-0 silk ties.  This provided access to the lesser sac.  Adhesions of the stomach to the retroperitoneum were lysed with the electrocautery.  Spleen was mobilized from its lateral and posterior peritoneal attachments by incising with the electrocautery.  The splenic flexure of the colon was mobilized and vessels divided between [facility] clamps and ligated with 2-0 silk ties.  Spleen was further mobilized to use as a handle on the pedicle of the pancreas.  Tip of the pancreas was identified. Inferior margin of the pancreas was dissected out using the electrocautery to incise the peritoneum.  Vessels were divided between Kelly clamps and ligated with 2-0 silk ties and 2-0 silk suture ligatures.  The short gastric vessels were then divided between [facility] clamps, ligated with 2-0 silk ties, as well as large yellow ligaclips.  The entire stomach was freed from the spleen and retracted with the Bookwalter retractor.  Pancreas was better defined. Pancreas was quite thick with a  great amount of adipose tissue making it very difficult to appreciate the tumor.  Dissection was carried medially to the confluence of the splenic and mesenteric veins ______ vein.  The pancreatic pedicle was elevated.  The splenic artery was divided between [facility] clamps and doubly ligated with  2-0 silk ligatures, followed by a ligaclip.  The splenic vein was divided between large yellow ligaclips.  The body of the pancreas was transected using a TA-60, 4.8 mm stapler.  The pancreatic duct was then oversewn with 2-0 silk sutures.  The pancreatic specimen was incised on the backtable showing an approximate 2 cm neoplasm in the distal tail of the pancreas.  There was a good surgical margin.  Specimen was submitted being the distal pancreas and spleen together to Dr. Jimmy Picket, who on frozen section identified the tumor as an islet cell tumor, likely benign with negative surgical margins.  Good hemostasis was obtained throughout the wound. Surgicel was placed over the tail of the pancreas and in the pancreatic bed. Left upper quadrant was copiously irrigated with warm saline, which was evacuated.  A #10 Jackson-Pratt drain was placed in the pancreatic bed with the tip at the stump of the transected pancreas.  Drain was secured to the skin of the left abdominal wall with a 3-0 nylon suture.  All packs were removed and counts were correct.  Good hemostasis was noted.  The abdominal wall was closed in two layers with running #1 Novofil.  Subcutaneous tissues were irrigated.  Skin was closed with stainless steel staples.  Drain was placed to bulb suction.  Sterile occlusive dressings were applied.  The patient was awakened from anesthesia and brought to the recovery room in stable condition.  The patient tolerated the procedure well. DD:  04/06/00 TD:  04/06/00 Job: [id number]

## 2010-08-23 NOTE — Discharge Summary (Signed)
[facility]  Patient:    Frederick [name], Frederick [name]                   MRN: [id number] Adm. Date:  [id number] Disc. Date: [id number] Attending:  Bonnetta Barry                           Discharge Summary  REASON FOR ADMISSION:  Left upper quadrant abdominal pain and fever.  HISTORY OF PRESENT ILLNESS:  The patient is a 77 year old white male who underwent distal pancreatectomy, splenectomy on April 06, 2000, for islet cell tumor of the pancreas.  The patient is readmitted at this time for development over the past three to five days of left upper quadrant abdominal pain and fever.  His highest temperature at home was 101.7 degrees.  The patient presented to the emergency department, where general surgery was consulted.  My partner, Dr. Claud Kelp, saw the patient and admitted him to the surgical service.  HOSPITAL COURSE:  The patient was seen in consultation by Dr. Lilia Pro, his primary care physician from [facility].  The patient was started on intravenous antibiotics.  He underwent CT scan of the abdomen which showed some inflammatory changes in the left upper quadrant and a subcutaneous abscess.  The left lateral wound was opened by Dr. Derrell Lolling, and dressing changes were begun.  The patient was treated with intravenous Unasyn. However, low-grade fever and leukocytosis persisted.  On May 01, 2000, the wound was opened widely at the bedside with drainage of purulent material. The wound was packed and allowed to heal by secondary intention.  Intravenous antibiotics were continued.  The patient had resolution of his fever and a decrease in his white blood cell count over the ensuing 48 hours.  His appetite returned.  His glucose control improved.  The patient continued to make gradual progress and was prepared for discharge home on May 05, 2000.  DISCHARGE PLAN:  The patient is discharged home May 05, 2000, in  good condition, tolerating a regular diet, and ambulating independently.  FOLLOW-UP:  He will be seen back in my office at [facility] in one week.  He will have home health nurses twice daily for dressing changes.  DISCHARGE MEDICATIONS:  Augmentin 875 mg b.i.d. for 10 days.  Vicodin for pain.  Potassium chloride daily.  Other home medications as per usual including his morning insulin administration under the direction of Dr. Johnsie Kindred.  FINAL DIAGNOSIS:  Wound infection with subcutaneous abscess.  CONDITION ON DISCHARGE:  Improved. DD:  05/22/00 TD:  05/23/00 Job: [id number]

## 2010-08-23 NOTE — Discharge Summary (Signed)
[facility]  Patient:    Frederick [name], Frederick [name]                   MRN: [id number] Adm. Date:  [id number] Disc. Date: [id number] Attending:  Bonnetta Barry CC:         Lilia Pro, M.D.   Discharge Summary  REASON FOR ADMISSION:  Pancreatic outlet cell tumor.  BRIEF HISTORY:  The patient is a 77 year old white male who presented with outlet cell tumor of the pancreas to our practice for surgical resection at the request of Dr. Dorinda Hill Murinson and Dr. Lilia Pro. The patient had his tumor found incidentally at the time of CT scan to evaluate for renal artery stenosis and hypertension. He subsequently underwent MRI and then a CT guided fine needle biopsy. Pathology demonstrated findings consistent with outlet cell neoplasm of the pancreas. Octreotide scan was performed and was negative for metastatic disease. The patient now comes to surgery for resection of pancreatic neoplasm.  HOSPITAL COURSE:  The patient was admitted and taken to the operating room on April 06, 2000. He underwent exploratory laparotomy with distal pancreatectomy and splenectomy. Postoperative course was complicated by onset of diabetes and persistence of his hypertension. The patient was seen in consultation by Dr. Lilia Pro who managed each of these medical issues during his remaining hospitalization. The patient made gradual progress. The Jackson-Pratt drain remained in place postoperatively drainage serosanguineous fluid. The patient had slow resolution of his ileus. He required aggressive pulmonary toilet. The patient had a fever on the third postoperative day with elevation of his white blood cell count. He had Gram stain of his sputum showing possible strep. The patient was treated appropriately with intravenous vancomycin per the medical consultant. The patient continued to make slow but steady progress or gradual resolution of his ileus. He was started on a  clear liquid diet on the four postoperative day. The patient continued to have elevation of his white blood cell count. He was felt to have pneumonia and bronchitis. He was treated aggressively with intravenous antibiotics with a slow decrease in his white blood cell count. He remained on sliding scale insulin per the medical consultant. Over the ensuing several days, the patients diet slowly advanced and he began tolerating a regular diet by postoperative day seven. He underwent repeat CT scan of the abdomen which did not demonstrate any evidence of abscess formation. His Jackson-Pratt drain was removed. The patient was instructed in diabetic diet and control of his diabetes. His hypertension remained stable on home medications. He was prepared for discharge home on April 19, 2000.  DISCHARGE PLAN:  The patient is discharged home April 19, 2000 in good condition, tolerating a regular diabetic diet, and ambulating independently. The patient will be seen back in my office at [facility] in two weeks. The patient will be followed up by Dr. Johnsie Kindred in her office at [facility].  DISCHARGE MEDICATIONS:  Vicodin as needed for pain. The patient is on insulin as per Dr. Johnsie Kindred. He is on his other home medications as per usual.  DISCHARGE DIET:  A 2200 calorie diabetic diet.  FINAL DIAGNOSES: 1. 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 2. Insulin dependent diabetes mellitus. 3. Hypertension.  CONDITION ON DISCHARGE:  Good. DD:  05/05/00 TD:  05/06/00 Job: [id number]

## 2010-08-23 NOTE — Consult Note (Signed)
[facility]  Patient:    Frederick [name], Frederick [name]                   MRN: [id number] Proc. Date: 04/30/00 Adm. Date:  [id number] Disc. Date: [id number] Attending:  Bonnetta Barry                          Consultation Report  REASON FOR CONSULTATION:  Diabetes management.  REFERRING PHYSICIAN:  Dr. Angelia Mould. Derrell Lolling.  HISTORY:  This is a 77 year old who is under my primary care and who recently underwent an elective distal pancreatectomy and splenectomy on April 06, 2000 per Dr. Velora Heckler for an islet cell tumor.  After this was done, he developed severe diabetes and was subsequently placed on sliding-scale insulin.  His wife has been managing his insulin at home with NPH q.a.m. and sliding scale for breakfast and dinner.  He began having headaches about three to four days ago which were constant and with no alleviating or aggravating factors; however, his wife did notice that when his insulin was given, he seemed to have less of a headache.  The patient agrees with this assessment.  He was seen in my office on April 28, 2000 for these headaches.  I did a full neurologic exam and could not find any deficits.  He had no nuchal rigidity, no fevers, no chills, although his temperature did climb to 100.3.  I instructed his wife if he had any temperatures, headaches or photophobia, she should call and send him to the emergency room, which she did when his temperature climbed to 101.7.  He was found to have some cellulitis and drainage from his abdominal wound and was admitted per the surgeons.  He will be getting CT scan of the abdomen, which is pending.  All of his lab work currently is normal.  The patient continues to complain of a headache now; he has been placed on a PCA pump because of the headache.  He was treating this with Vicodin at home fairly successfully.  PAST MEDICAL HISTORY:  Diabetes, hypertension, gastroesophageal reflux disease,  esophageal stricture, cardiac arrhythmias, colon polyps.  PAST SURGICAL HISTORY:  Total knee replacement in 1988; bilateral shoulder surgery in 1992 secondary to rotator cuff tear; pancreatectomy, April 06, 2000.  MEDICATIONS  1. Actos 30 mg p.o. q.d., recently held because of headaches.  2. Elavil 25 mg p.o. q.h.s. p.r.n.  3. Librium 10 mg p.o. b.i.d.  4. Prevacid 30 mg p.o. q.d.  5. Lotensin 10 mg p.o. q.d.  6. Norvasc 5 mg p.o. q.d., recently held secondary to low blood pressure.  7. Meclizine p.r.n.  8. Lasix 40 mg p.o. q.a.m.  9. Lipitor 10 mg p.o. q.d. 10. Celebrex 100 mg p.o. b.i.d.  ALLERGIES:  None.  SOCIAL HISTORY:  He lives at home with his wife.  He does not smoke nor does he drink.  REVIEW OF SYSTEMS:  HEENT:  Positive for headache.  No dizziness.  No neck stiffness.  CARDIOVASCULAR:  Negative.  RESPIRATORY:  Negative.  ID:  Negative other than temperatures of 101.3 at home.  MUSCULOSKELETAL:  Negative.  All other systems negative.  PHYSICAL EXAMINATION  VITAL SIGNS:  Temperature 100.8, blood pressure 127/64, pulse 81, respirations 10.  CBG 185.  GENERAL:  This is a well-developed white male, pleasant, in no apparent distress, oriented to person, place and time.  HEENT:  Pupils equal, round, reactive to light.  Extraocular  movements intact. Sclerae clear.  Oropharynx clear.  Mucosa moist.  NECK:  Supple.  No masses palpable.  HEART:  Regular rate and rhythm.  No murmurs, rubs, or gallops.  LUNGS:  Clear to auscultation bilaterally and nonlabored.  ABDOMEN:  Nondistended.  Somewhat tender in the left upper quadrant.  Positive bowel sounds.  No hepatosplenomegaly appreciated.  EXTREMITIES:  No cyanosis.  No clubbing or edema.  Dorsalis pedis pulses 2+, bilateral lower extremities.  LABORATORY WORK:  CMP within normal limits.  White count elevated at 16,000. Hemoglobin and hematocrit normal.  Amylase and lipase normal.  CT of the abdomen  pending.  IMPRESSION AND PLAN  1. Fevers:  Agree this may be due to the cellulitis.  Currently on Unasyn for     this.  We will certainly monitor his fevers with this.  2. Headaches:  I am not seeing any evidence of meningitis in this patient.     He did have a Pneumovax prior to admission.  His headache has been     somewhat constant over the last three to four days.  He has no mental     status changes, nuchal rigidity or photophobia; we will, however, monitor     this and continue the current therapy.  I will see if we can manage his     headaches with Vicodin as we were at home rather than the PCA pump, if in     agreement with the surgeons.  3. Diabetes:  I placed him on a sliding scale of insulin, using Regular, as     this is what he is used to at home.  I will also institute his NPH 8 units     in the morning and start him back on his Actos at 30 mg to increase his     peripheral sensitivity to insulin.  Thank you for this consult. DD:  04/30/00 TD:  04/30/00 Job: [id number]

## 2010-08-23 NOTE — Consult Note (Signed)
[facility]  Patient:    Frederick [name], Frederick [name]                   MRN: [id number] Proc. Date: 04/06/00 Adm. Date:  [id number] Attending:  Bonnetta Barry                          Consultation Report  REASON FOR CONSULTATION:  Hypertension, medical management.  REFERRING PHYSICIAN:  Dr. Gerrit Friends.  HISTORY OF PRESENT ILLNESS:  This is a 77 year old white male who was under my primary care and who was recently found to have a 2 cm tumor in the tail of his pancreas.  That, per biopsy, was an islet cell tumor.  He was admitted today for removal of this, which was done this morning per Dr. Gerrit Friends.  He is in the recovery room now doing quite well.  He is groggy, but he is answering questions.  Is appropriate but has no specific complaints other than soreness in the abdomen.  PAST MEDICAL HISTORY: 1. Hypertension. 2. Hypercholesterolemia. 3. Hiatal hernia. 4. 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 5. Osteoarthritis. 6. Cardiac arrhythmia. 7. Colon polyps.  PAST SURGICAL HISTORY: 1. Bilateral knee replacement in 1988. 2. In 1992, bilateral shoulder surgery.  PROCEDURES: 1. MRI of the abdomen in 2001, showing a 2 cm mass in the tail of the    pancreas.  This MRI was done to evaluate for possible renal artery    stenosis, which was negative. 2. Octreotide scan in October 2001, which was negative. 3. Colonoscopy in June 1999, showing polyps. 4. EGD in June 2000, negative.  MEDICATIONS: 1. Amitriptyline 25 mg a day.  2. Librium 10 mg b.i.d.  3. Prevacid 30 mg a day.  4. Lotensin 20 mg a day.  5. Meclizine p.r.n.  6. Lasix 40 in the morning, 20 in the evening.  7. Lipitor 10 mg a day.  8. Celebrex 100 mg b.i.d.  9. Quinidine 200 mg q.i.d. 10. Norvasc 5 mg q.d. 11. Vicodin p.r.n.  ALLERGIES:  MORPHINE.  FAMILY HISTORY:  Positive for congestive heart failure in mother.  No hypertension, no coronary artery disease, no diabetes.  SOCIAL  HISTORY:  The patient lives with his wife.  He does not smoke, nor does he drink.  He is retired.  PHYSICAL EXAMINATION:  VITAL SIGNS:  Pulse 88, blood pressure 105/45, respirations 14, afebrile.  GENERAL:  Well-developed white male who is lying in the bed in no apparent distress.  Somewhat groggy.  HEENT:  Extraocular movements intact.  Sclerae clear.  Pupils are equal, round, and reactive to light.  NECK:  Supple.  No masses palpable.  HEART:  Regular rate and rhythm, with a 2/6 systolic ejection murmur.  LUNGS:  Clear to auscultation bilaterally in the anterolateral lung fields.  ABDOMEN:  Nondistended, nontender, soft.  No bowel sounds.  EXTREMITIES:  No cyanosis, clubbing, or edema.  NEUROLOGIC:  He is moving all extremities.  Cranial nerves II-XII grossly intact.  IMPRESSION AND PLAN:  1. Hypertension.  Will control this with IV labetalol until the patient is     able to take p.o., then I will resume his normal medications. 2. Monitor his CBG.  An insulin level was checked while he was in the office,    and this was within normal limits. 3. Hypercholesterolemia.  Of course, I will not start his Lipitor until he can    take p.o. 4. I will closely  follow him for any other medical problems including possible    cardiac arrhythmias, as he is on quinidine.  I appreciate this consult. DD:  04/06/00 TD:  04/06/00 Job: [id number]

## 2010-08-23 NOTE — Discharge Summary (Signed)
Frederick [name], Frederick [name]            ACCOUNT NO.:  [id number]   MEDICAL RECORD NO.:  [id number]          PATIENT TYPE:  INP   LOCATION:  3733                         FACILITY:  [facility]   PHYSICIAN:  Lonia Blood, M.D.      DATE OF BIRTH:  [birth year]   DATE OF ADMISSION:  12/14/2006  DATE OF DISCHARGE:  12/18/2006                               DISCHARGE SUMMARY   PRIMARY CARE PHYSICIAN:  Georgianne Fick, M.D.   DISCHARGE DIAGNOSIS:  1. Bilateral pulmonary embolus.  2. Type 2 diabetes.  3. Hypertension.  4. Dyslipidemia.  5. Anemia of chronic disease.  6. Protein calorie malnutrition, moderate.  7. Hiatal hernia and gastroesophageal reflux disease.  8. Left pleural effusions history, currently stable.   DISCHARGE MEDICATIONS:  1. Elavil 50 mg q.h.s.  2. Aspirin 81 mg daily.  3. Benicar 40 mg daily.  4. Coumadin to be titrated as an outpatient.  5. Celebrex 100 mg b.i.d.  6. Lasix 40 mg daily.  7. Glipizide 10 mg b.i.d.  8. Lantus dose was held to be resumed as an outpatient.  9. Librium 10 mg b.i.d.  10.Lipitor 40 mg daily.  11.Pindolol5 mg daily.  12.Norvasc 5 mg daily.  13.Potassium chloride 20 mEq daily.  14.Humalog insulin sliding scale as needed.  15.The patient was also sent on oxygen 2 liters by nasal cannula.   DISPOSITION:  The patient was discharged home as indicated with home  oxygen and home health as needed.   PROCEDURES PERFORMED:  Chest x-ray on September 7 that showed chronic  changes at leg base.  No acute cardiopulmonary disease.  Another chest x-  ray on September 8 showed cardiomegaly and chronic changes including a  left upper lobe granuloma but no active process.  Chest CT with contrast  performed on September 8 showed positive for multiple bilateral  pulmonary emboli, stable left lower lobe nodule, stable mediastinal  nodule.  Another chest x-ray on September 9 shows minimal opacity  posterior of left base probably due to atelectasis ans possible  small  effusions.  Follow-up was recommended.   CONSULTATIONS:  None.   BRIEF HISTORY AND PHYSICAL:  Please refer to dictated history and  physical on admission by Dr. Lilly Cove.  In short, however, the  patient is a 77 year old obese man who presented with 2-3 days history  of weakness and shortness of breath.  He was seen by his primary care  physician and was found to be hypoxic.  He was sent over to the hospital  for further workup.  In the ER, the patient was found to have blood  pressure of 89/43 with sats of 92% on 4 liters of oxygen with a pulse of  50.  His lungs were clear, no wheezes, but he was noticeably huffing and  puffing.  His D-dimer was significantly elevated at 10.24 and BNP was  401.  He was subsequently admitted for monitoring of his hypoxemia.  EKG  done showed marked sinus bradycardia with rate of 48 with first-degree  AV block, PR Interval of 304.  No ST changes.   HOSPITAL COURSE:  #1 -  BILATERAL PULMONARY EMBOLI.  This was detected  from his chest CT.  The patient was admitted and started promptly on  anticoagulation.  He was on heparin and later started on Coumadin.  The  goal target INR was possibly 2.0 which the patient achieved 3 days  later.  He was subsequently discharged on Coumadin only to have follow-  up with Dr. Nicholos Johns.   #2 - DIABETES.  The patient's blood sugar was consistently low at some  point of 34 on his Lantus.  This may be related to his poor intake,  etc..  His Lantus was therefore held and he was adequately managed on  sliding scale insulin.  He will follow with Dr. Nicholos Johns who will  restart his Lantus as needed.   #3 - HYPERTENSION.  Blood pressure was very much controlled on his home  medications.   #4 - DYSLIPIDEMIA.  We continue with his Statin while in the hospital.   #5 - ANEMIA.  The patient's hemoglobin was stable between 10 and 11  g/dL.  It seems like he has had workup before, and this was chronic   disease.   #6 - PROTEIN CALORIE MALNUTRITION.  The patient's albumin was 2.4  indicating some type of moderate protein calorie malnutrition.  Otherwise, the patient was stable at the time of discharge and will  follow up with Dr. Nicholos Johns as indicated.      Lonia Blood, M.D.  Electronically Signed     LG/MEDQ  D:  01/06/2007  T:  01/07/2007  Job:  [id number]   cc:   Georgianne Fick, M.D.

## 2010-08-23 NOTE — H&P (Signed)
NAMEJOREN, REHM            ACCOUNT NO.:  [id number]   MEDICAL RECORD NO.:  [id number]          PATIENT TYPE:  INP   LOCATION:  0449                         FACILITY:  [facility]   PHYSICIAN:  Mobolaji B. Bakare, M.D.DATE OF BIRTH:  1929/06/29   DATE OF ADMISSION:  01/13/2004  DATE OF DISCHARGE:                                HISTORY & PHYSICAL   PRIMARY CARE PHYSICIAN:  Dr. Nicholos Johns   CHIEF COMPLAINT:  Fever and chills this afternoon.   HISTORY OF PRESENTING COMPLAINT:  Mr. Herrman is a 77 year old Caucasian  male with history of diabetes insulin dependent, hypertension, status post  splenectomy in 2002 who was in his usual state of health until this  afternoon when he suddenly developed fever associated with chills and  rigors.  His temperature went as high as 102.7 and there was associated  headache, no associated pain, nausea, vomiting, diarrhea.  He has been  having postnasal drip for the last 6 months and he has been treated  intermittently; however, he reports there has not been change in the  severity of his postnasal drip; however, he does notice pain over his  maxillary sinuses.  He denies any pain elsewhere in his body, no redness or  erythema in his lower extremities, he does not have visual changes, no neck  pain.  He does cough and brings up a dirty sputum, no pleuritic chest pain  associated with it.  He does not have any contact with anybody that is sick  at all and there is no history of recent travel.  He does not have any pets  at home (cats, dogs, or any exotic animals).  No dysuria, no urgency, no  increased frequency of micturition.  On arrival in the emergency department  he received Tylenol and fever, headaches, and chills all subsided at this  point.  In fact, patient feels like he wants to go back home.   REVIEW OF SYSTEMS:  No chest pain.  No palpitation.  No shortness of breath.  No orthopnea, PND.  No mental status changes.   PAST MEDICAL  HISTORY:  1.  Diabetes mellitus.  2.  Hypertension.  3.  Gastroesophageal reflux disorder.  4.  Esophageal stricture status post dilatation; gastroenterologist is Dr.      Victorino Dike.  5.  Cardiac arrhythmias.  6.  Colon polyps.  7.  Depression/anxiety.  8.  Benign pancreatic alleged tumor.  9.  Patient has history of gallstones.   PAST SURGICAL HISTORY:  1.  Laparotomy for pancreatic tumor; splenectomy during same operation.  2.  Total knee replacement in 1988.  3.  Bilateral shoulder surgery in 1992 secondary to rotator cuff tear.  4.  Pancreatectomy as above complicated by postoperative wound infection.   CURRENT MEDICATIONS:  1.  Celebrex 100 mg two per day one at breakfast and one at dinner.  2.  Quinidine sulfate 200 mg four per day one at each meal and one at      bedtime.  3.  Lipitor 10 mg at dinner.  4.  Furosemide 20 mg two in the morning and one in the  evening.  5.  Hydrocodone 500 mg four to six per day as needed for pain.  6.  Lotensin 20 mg one per day at breakfast.  7.  Norvasc 5 mg at bedtime.  8.  Actos 45 mg one in the morning.  9.  Glipizide 5 mg in the morning.  10. Glipizide 10 mg two in the evening.  11. Potassium 20 mg at lunchtime.  12. Amitriptyline 25 mg two at bedtime.  13. Librium 10 mg one at breakfast, dinner, and bedtime.  14. Lantus 15 units subcu at bedtime.  15. Prevacid 30 mg one at breakfast.  16. NuLev 125 mg as needed.  17. Meclizine 50 mg as needed for vertigo.   ALLERGIES:  MORPHINE which caused confusion.   FAMILY HISTORY:  Patient is married; he lives with his wife; they have got  no children.   SOCIAL HISTORY:  He does not smoke; he does not drink alcohol.  He is  independent of activities of daily living.   PHYSICAL EXAMINATION:  VITALS ON ARRIVAL IN THE EMERGENCY DEPARTMENT:  Temperature of 101.7, blood pressure of 99/62, pulse of 98, respiratory rate  of 28, saturation of 90% on room air.  GENERAL:  He is not acutely ill  looking, looks comfortable, not in  respiratory distress.  HEENT:  Normocephalic, atraumatic head.  Pupils equal and reactive to light,  not icteric, not pale.  No cervical lymphadenopathy.  No carotid bruit.  No  thyromegaly.  Mucous membranes moist.  No oral thrush.  No visible postnasal  drip at this time.  Marked tenderness over the sinuses.  LUNGS:  Bibasilar rales which disappeared somewhat on coughing; otherwise,  no wheezing, no rhonchi.  CVS:  S1, S2 regular.  No murmur.  No gallop.  ABDOMEN:  Obese, soft, surgical scar noted on the left hypochondriac area.  No tenderness.  No mass felt.  Bowel sounds present.  No holosystolic  murmurs.  EXTREMITIES:  No pedal edema.  No calf tenderness.  Venostasis in both lower  extremities.  However, there is no evidence of cellulitis, no erythema, no  warmth, no tenderness.  Dorsalis pedis pulses palpable bilaterally.  CNS:  Cranial nerves intact.  No focal weakness.  SKIN:  No rash.  No petechiae.  LUMBAR SPINE:  No tenderness.   LABORATORY DATA:  CBC and diff:  White cell count 19.6, hematocrit 38.4,  hemoglobin 12.3, platelets 365, neutrophils 89%, lymphocytes 9%, bands  greater than 20%.  CMP:  Sodium 137, potassium 3.9, chloride 101, CO2 28,  glucose 136, BUN 14, creatinine 1.2, bilirubin 0.6, alkaline phosphatase 16,  SGOT 22, SGPT 16, protein 6.5, albumin 3.4, calcium 9.  UA:  Specific  gravity 1.012, pH of 6.5, protein is negative, bilirubin negative, nitrites  negative, leukocytes negative.   RADIOLOGY:  Chest x-ray borderline cardiomegaly, mild to moderate chronic  interstitial lung disease.  Abdominal x-ray no acute abnormality.   ASSESSMENT AND PLAN:  Mr. Lall is 77 years old with history of diabetes  mellitus, hypertension, and status post splenectomy.  He is presenting with  sudden onset of fever, chills and rigors, some cough, postnasal drip and pain over sinuses.  Physical examination is unrevealing.  He has   leukocytosis with bandemia.  Chest x-ray not suggestive of consolidation or  infiltrate.  He is somewhat coughing with some darkish sputum.   Problem 1. FEVER WITH CHILLS, LEUKOCYTOSIS, AND BANDEMIA.  This is of  unclear etiology at this point.  However, symptoms are  referable to his  sinuses and respiratory system.  This could be early pneumonia versus early  sepsis.  We will empirically start treatment with Avelox 400 mg IV q.24h.  This will also have pneumococcal coverage and will cover pneumonia as well  as acute sinusitis.  We will obtain CT scan of the sinuses, blood culture,  sputum culture.  I do doubt meningitis, no sign of meningism  __________  have subsided with Tylenol.  We will monitor closely.   Problem 2. DIABETES MELLITUS.  We will continue with Lantus, Glucotrol,  Actos and use sliding scale moderately sensitive coverage.   Problem 3. HYPERTENSION.  We will continue with Norvasc and Lotensin as  before.   Problem 4. LOW BACK PAIN.  Celebrex and hydrocodone.   Problem 5. HYPERCHOLESTEROLEMIA.  Continue with Lipitor.   Problem 6. GASTROESOPHAGEAL REFLUX DISEASE.  Prevacid.   Problem 7. CARDIAC ARRHYTHMIA.  Continue with quinidine sulfate.   Problem 8. LOWER EXTREMITY EDEMA.  At this point patient does not have any  pitting pedal edema however, has been on Lasix for treatment.  We will  continue with Lasix and KCl as before.   Problem 9. CHRONIC DEPRESSION/ANXIETY.  We will continue with amitriptyline  and chlordiazepoxide (Librium as before).   Problem 10. DEEP VEIN THROMBOSIS PROPHYLAXIS.  We will start on Lovenox 40  mg subcutaneous once daily.  We will reassess as any result comes back.      MBB/MEDQ  D:  01/13/2004  T:  01/13/2004  Job:  [id number]   cc:   Georgianne Fick, M.D.  [address]  [place]  [place] [address]  Fax: [phone]

## 2010-08-23 NOTE — Discharge Summary (Signed)
Frederick [name], Frederick [name]            ACCOUNT NO.:  [id number]   MEDICAL RECORD NO.:  [id number]          PATIENT TYPE:  INP   LOCATION:  0449                         FACILITY:  [facility]   PHYSICIAN:  Mobolaji B. Bakare, M.D.DATE OF BIRTH:  [birth year]   DATE OF ADMISSION:  01/13/2004  DATE OF DISCHARGE:  01/16/2004                                 DISCHARGE SUMMARY   PRIMARY CARE PHYSICIAN:  Dr. Nicholos Johns.   FINAL DIAGNOSIS:  Pneumonia.   CHIEF COMPLAINT:  Fever, chills, and cough.   HISTORY:  Frederick [name] is a 77 year old Caucasian male who presented with  sudden onset of fever and chills, temperature of 102.7, associated with  nasal drip and coughing. No pleuritic chest pain.   REVIEW OF SYSTEMS:  Negative for other symptoms; however, he is status post  splenectomy in 2002. He did receive Pneumovax vaccine about a year prior to  splenectomy. Please refer to H&P for further details.   PERTINENT FINDINGS ON PHYSICAL EXAMINATION:  LUNGS:  Bibasilar crackles,  more on the left than on the right.   PERTINENT LABORATORY DATA:  White cell count 22.9 thousand with a neutrophil  of 32% and greater than 20% bands. Sputum showed abundant gram positive rods  and gram positive cocci in pairs. No identification and no growth on  cultures so far. Blood culture was negative. CT scan of sinuses was normal.  White cell count improved gradually to 11.9.   HOSPITAL COURSE:  The patient only had an episode of acute fever and chills  prior to hospitalization. He was started on IV ceftriaxone and Zithromax. He  remained afebrile since admission. His white cell count continued to  improve. His oxygen saturation remained stable. He is currently saturating  at 98% on room air. Sputum is becoming clearer and no pleuritic chest pain.   His other medication conditions, diabetes mellitus, finger-stick blood  glucose remained between 130 and 150 during the hospitalization. Sometimes  better control below  hundred.   His other medical conditions remained stable.   DISCHARGE INSTRUCTIONS:  The patient is discharged on the following  antibiotics:  1.  Vantin 400 mg p.o. b.i.d. to complete 10 more days.  2.  Zithromax 500 mg p.o. q.d. to complete 5 more days.  3.  Use Robitussin q.i.d.   Follow up with Dr. Nicholos Johns in 1 week to 10 days. The patient will be  given flu vaccine prior to discharge.      MBB/MEDQ  D:  01/16/2004  T:  01/16/2004  Job:  [id number]

## 2010-08-23 NOTE — H&P (Signed)
NAMENICHOALS, HEYDE            ACCOUNT NO.:  [id number]   MEDICAL RECORD NO.:  [id number]          PATIENT TYPE:  INP   LOCATION:  NA                           FACILITY:  [facility]   PHYSICIAN:  Ines Bloomer, M.D. DATE OF BIRTH:  Sep 11, 1929   DATE OF ADMISSION:  DATE OF DISCHARGE:                                HISTORY & PHYSICAL   REASON FOR ADMISSION:  Left pleural effusion.   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  An amylase  was negative.  A CT scan done in March and then repeated still showed the  left pleural effusion with a thickened rind.  There was a lot of  lymphocytosis also on the cell count that was removed.  Because of this, he  was referred for evaluation.  He has some left anterior chest wall pain and  mild shortness of breath.  He also was found to have cholelithiasis on his  CT scan.   PAST MEDICAL HISTORY:  1.  Diabetes.  2.  Hypertension.   ALLERGIES:  MORPHINE, which causes some confusion.   MEDICATIONS:  1.  Celebrex twice a day.  2.  Quinine sulfate 200 mg three times a day.  3.  Lipitor 10 mg a day.  4.  Lasix 60 mg a day.  5.  Hydrocodone for occasional pain.  6.  Benicar for hypertension.  7.  Norvasc 5 mg at night.  8.  Glucotrol 10 mg in the morning and 10 mg at night.  9.  Amitriptyline 2 mg at bedtime.  10. Librium 10 mg three times a day.  11. Lantus insulin 36 units in the morning.  12. Antivert 10 mg for vertigo.  13. Prevacid 30 mg in the morning and as needed.   FAMILY HISTORY:  Noncontributory.   SOCIAL HISTORY:  He is married and has no children.  He quit smoking at age  37 and does not drink alcohol on a regular basis.   REVIEW OF SYSTEMS:  CARDIAC:  There is no angina or atrial  fibrillation.  He  has had some EKGs that have shown some first-degree block with some  junctional escapes.  He does get shortness of breath with exertion.  His  weight has been stable.  PULMONARY:  He has had no hemoptysis, wheezing,  chills.  GI:  See History of Present Illness.  He does have some dysphagia  and has a hiatal hernia.  GU:  No frequent urination.  VASCULAR:  No  claudication, DVT, or TIAs.  NEUROLOGICAL:  No headaches, blackouts, or  seizures.  ORTHOPEDICS:  He has chronic arthritis and some joint pains and  muscular pain.  PSYCHIATRIC:  He has been treated for situational depression  in the past.  ENT:  No change in his eyesight or hearing.  HEMATOLOGIC:  No  hematologic disorders.  PHYSICAL EXAMINATION:  VITAL SIGNS:  His blood pressure is 152/80, pulse 82,  respirations 18, SAT is 97%.  HEAD:  Atraumatic.  EYES:  Pupils equal and reactive to light and accommodation.  Extraocular  movements are normal.  EARS:  Tympanic membranes are intact.  NOSE:  There is no septal deviation.  THROAT:  Without lesion.  NECK:  Supple without thyromegaly.  There is no supraclavicular or axillary  adenopathy.  CHEST:  There are decreased breath sounds on the left side, and the right  side is normal.  HEART:  Regular sinus rhythm with no murmurs.  ABDOMEN:  Soft.  There is no hepatosplenomegaly.  PULSES:  2+.  There is no clubbing or edema.  NEUROLOGIC:  He is oriented x3.  Sensory and motor are intact.  SKIN:  Without lesion.   IMPRESSION:  Recurrent left pleural effusion, status post resection of an  islet cell cancer.   PLAN:  A left VATS, a pleural biopsy, a possible talc pleurodesis.           ______________________________  Ines Bloomer, M.D.     DPB/MEDQ  D:  08/01/2005  T:  08/01/2005  Job:  [id number]

## 2010-08-23 NOTE — Assessment & Plan Note (Signed)
[facility]                         GASTROENTEROLOGY OFFICE NOTE   NAME:ARRINGTONBraycen, [name]                   MRN:          [id number]  DATE:04/08/2006                            DOB:          Nov 27, 1929    This nice gentleman comes in on April 08, 2006 because he needs refills  on his medicines.  He denies any GI symptoms, has had food removed from  his esophagus by Dr. Arlyce Dice.  Most recently this was done in March of  2006.  He says he has never had any problems since.  There was a  question of dilating him but he did not run any follow-up with this and  says he has never had any problems.   MEDICATIONS:  Celebrex, quinidine sulfate, Lipitor, furosemide,  hydrocodone, Benicar, amlodipine, Glucotrol, potassium, amitriptyline,  Librium, Lantus insulin, Meclizine, Prevacid 30 mg one a day, and NuLev  as needed.   PHYSICAL EXAMINATION:  He weighed 233, blood pressure 142/72, pulse 80  and regular.  NECK, HEART, EXTREMITIES:  All basically unremarkable.   IMPRESSION:  1. Long history of gastroesophageal reflux disease, secondary to      esophageal motility disorder and stricture of esophagus.  2. Status post pleural effusion.  3. Status post pancreatic islet cell tumor removed in 2001.  4. Hyperlipidemia.  5. Diabetes.  6. Anxiety and depression.  7. Gastroesophageal reflux disease.   RECOMMENDATIONS:  Refill his medications including Prevacid 30 mg daily  and NuLev 125 mg as needed.     Frederick Mort, MD  Electronically Signed    SML/MedQ  DD: 04/08/2006  DT: 04/08/2006  Job #: [id number]   cc:   Barbette Hair. Arlyce Dice, MD,FACG

## 2010-09-25 ENCOUNTER — Other Ambulatory Visit: Payer: Self-pay | Admitting: Dermatology

## 2011-01-17 LAB — COMPREHENSIVE METABOLIC PANEL
ALT: 11
Alkaline Phosphatase: 75
CO2: 32
GFR calc non Af Amer: >60
Glucose, Bld: 146 — ABNORMAL HIGH
Potassium: 4.4
Sodium: 141
Total Bilirubin: 0.4

## 2011-01-17 LAB — URINALYSIS, MICROSCOPIC ONLY
Bilirubin Urine: NEGATIVE
Glucose, UA: NEGATIVE
Ketones, ur: NEGATIVE
pH: 6

## 2011-01-17 LAB — BASIC METABOLIC PANEL
BUN: 10
BUN: 11
BUN: 15
CO2: 35 — ABNORMAL HIGH
CO2: 36 — ABNORMAL HIGH
Calcium: 8.3 — ABNORMAL LOW
Calcium: 8.6
Creatinine, Ser: 0.79
GFR calc Af Amer: >60
GFR calc non Af Amer: >60
GFR calc non Af Amer: >60
Glucose, Bld: 37 — CL
Glucose, Bld: 75
Potassium: 4
Potassium: 4.5
Potassium: 4.7
Sodium: 137

## 2011-01-17 LAB — CBC
HCT: 33.7 — ABNORMAL LOW
HCT: 35.1 — ABNORMAL LOW
HCT: 35.5 — ABNORMAL LOW
Hemoglobin: 10.5 — ABNORMAL LOW
Hemoglobin: 10.7 — ABNORMAL LOW
Hemoglobin: 11 — ABNORMAL LOW
Hemoglobin: 11.2 — ABNORMAL LOW
MCHC: 30.8
MCHC: 31.1
MCHC: 31.2
MCHC: 31.2
MCHC: 31.5
MCV: 85.9
MCV: 85.9
MCV: 87.1
Platelets: 370
Platelets: 395
RBC: 3.95 — ABNORMAL LOW
RBC: 4.09 — ABNORMAL LOW
RDW: 18.6 — ABNORMAL HIGH
RDW: 18.6 — ABNORMAL HIGH
RDW: 18.7 — ABNORMAL HIGH
WBC: 11.4 — ABNORMAL HIGH

## 2011-01-17 LAB — POCT CARDIAC MARKERS
CKMB, poc: <1 — ABNORMAL LOW
Myoglobin, poc: 78.1
Myoglobin, poc: 90
Operator id: 1211
Operator id: 285841
Troponin i, poc: <0.05
Troponin i, poc: <0.05

## 2011-01-17 LAB — I-STAT 8, (EC8 V) (CONVERTED LAB)
Bicarbonate: 30.8 — ABNORMAL HIGH
Glucose, Bld: 167 — ABNORMAL HIGH
TCO2: 33
pCO2, Ven: 63 — ABNORMAL HIGH
pH, Ven: 7.297

## 2011-01-17 LAB — APTT: aPTT: 30

## 2011-01-17 LAB — DIFFERENTIAL
Basophils Relative: 0
Lymphocytes Relative: 10 — ABNORMAL LOW
Lymphs Abs: 1.1
Monocytes Absolute: 0.3
Neutro Abs: 9.6 — ABNORMAL HIGH

## 2011-01-17 LAB — POCT I-STAT CREATININE: Creatinine, Ser: 1.1

## 2011-01-17 LAB — CK TOTAL AND CKMB (NOT AT ARMC)
Relative Index: INVALID
Total CK: 47
Total CK: 61

## 2011-01-17 LAB — HEMOGLOBIN A1C: Mean Plasma Glucose: 183

## 2011-01-17 LAB — PROTIME-INR
Prothrombin Time: 14.5
Prothrombin Time: 23.4 — ABNORMAL HIGH

## 2011-01-17 LAB — URINE CULTURE
Colony Count: NO GROWTH
Culture: NO GROWTH

## 2011-01-17 LAB — TROPONIN I
Troponin I: 0.03
Troponin I: 0.03

## 2011-01-17 LAB — B-NATRIURETIC PEPTIDE (CONVERTED LAB): Pro B Natriuretic peptide (BNP): 401 — ABNORMAL HIGH

## 2011-01-17 LAB — D-DIMER, QUANTITATIVE: D-Dimer, Quant: 10.24 — ABNORMAL HIGH

## 2011-02-14 ENCOUNTER — Other Ambulatory Visit: Payer: Self-pay | Admitting: Gastroenterology

## 2011-02-14 MED ORDER — LANSOPRAZOLE 30 MG PO CPDR: 30.0000 mg | DELAYED_RELEASE_CAPSULE | Freq: Every day | ORAL | Status: DC

## 2011-02-14 NOTE — Telephone Encounter (Signed)
yes

## 2011-02-14 NOTE — Telephone Encounter (Signed)
PT SCHEDULED A APPOINTMENT WITH DR Arlyce Dice FOR 03/10/2011 AT 10AM TO FOLLOW UP FOR LANSOPRAZOLE REFILLS  PT IS NOT HAVING ANY GI COMPLAINTS

## 2011-02-14 NOTE — Telephone Encounter (Signed)
We did not give this pt a prescription, he needs to contact the office for a  office appointment  Tried to call pt and line was busy  Dr Arlyce Dice this pt needs a script for Lansoprazole, he needs an appointment but can he have a script??

## 2011-03-03 ENCOUNTER — Emergency Department (HOSPITAL_COMMUNITY)
Admission: EM | Admit: 2011-03-03 | Discharge: 2011-03-03 | Disposition: A | Discharge status: Home / Self Care | Payer: Medicare Other | Attending: Emergency Medicine | Admitting: Emergency Medicine

## 2011-03-03 ENCOUNTER — Encounter: Payer: Self-pay | Admitting: Neurology

## 2011-03-03 ENCOUNTER — Emergency Department (HOSPITAL_COMMUNITY): Payer: Medicare Other

## 2011-03-03 DIAGNOSIS — S0990XA Unspecified injury of head, initial encounter: Secondary | ICD-10-CM | POA: Insufficient documentation

## 2011-03-03 DIAGNOSIS — M25559 Pain in unspecified hip: Secondary | ICD-10-CM | POA: Insufficient documentation

## 2011-03-03 DIAGNOSIS — W19XXXA Unspecified fall, initial encounter: Secondary | ICD-10-CM

## 2011-03-03 DIAGNOSIS — Z9889 Other specified postprocedural states: Secondary | ICD-10-CM | POA: Insufficient documentation

## 2011-03-03 DIAGNOSIS — S1093XA Contusion of unspecified part of neck, initial encounter: Secondary | ICD-10-CM | POA: Insufficient documentation

## 2011-03-03 DIAGNOSIS — M129 Arthropathy, unspecified: Secondary | ICD-10-CM | POA: Insufficient documentation

## 2011-03-03 DIAGNOSIS — R51 Headache: Secondary | ICD-10-CM | POA: Insufficient documentation

## 2011-03-03 DIAGNOSIS — R42 Dizziness and giddiness: Secondary | ICD-10-CM | POA: Insufficient documentation

## 2011-03-03 DIAGNOSIS — W050XXA Fall from non-moving wheelchair, initial encounter: Secondary | ICD-10-CM | POA: Insufficient documentation

## 2011-03-03 DIAGNOSIS — E119 Type 2 diabetes mellitus without complications: Secondary | ICD-10-CM | POA: Insufficient documentation

## 2011-03-03 DIAGNOSIS — Z79899 Other long term (current) drug therapy: Secondary | ICD-10-CM | POA: Insufficient documentation

## 2011-03-03 DIAGNOSIS — S40019A Contusion of unspecified shoulder, initial encounter: Secondary | ICD-10-CM | POA: Insufficient documentation

## 2011-03-03 DIAGNOSIS — S0003XA Contusion of scalp, initial encounter: Secondary | ICD-10-CM | POA: Insufficient documentation

## 2011-03-03 DIAGNOSIS — M25519 Pain in unspecified shoulder: Secondary | ICD-10-CM | POA: Insufficient documentation

## 2011-03-03 HISTORY — DX: Unspecified osteoarthritis, unspecified site: M19.90

## 2011-03-03 LAB — BASIC METABOLIC PANEL
BUN: 32 mg/dL — ABNORMAL HIGH (ref 6–23)
Chloride: 94 mEq/L — ABNORMAL LOW (ref 96–112)
Creatinine, Ser: 1.13 mg/dL (ref 0.50–1.35)
GFR calc Af Amer: 69 mL/min — ABNORMAL LOW (ref >90)

## 2011-03-03 LAB — DIFFERENTIAL
Basophils Relative: 0 % (ref 0–1)
Eosinophils Absolute: 0.2 10*3/uL (ref 0.0–0.7)
Eosinophils Relative: 2 % (ref 0–5)
Monocytes Absolute: 1.1 10*3/uL — ABNORMAL HIGH (ref 0.1–1.0)
Monocytes Relative: 10 % (ref 3–12)

## 2011-03-03 LAB — CBC
HCT: 34.8 % — ABNORMAL LOW (ref 39.0–52.0)
Hemoglobin: 11 g/dL — ABNORMAL LOW (ref 13.0–17.0)
MCH: 31.1 pg (ref 26.0–34.0)
MCHC: 31.6 g/dL (ref 30.0–36.0)
MCV: 98.3 fL (ref 78.0–100.0)

## 2011-03-03 MED ORDER — FENTANYL CITRATE 0.05 MG/ML IJ SOLN
INTRAMUSCULAR | Status: AC
  Filled 2011-03-03: qty 2

## 2011-03-03 MED ORDER — FENTANYL CITRATE 0.05 MG/ML IJ SOLN
50.0000 ug | Freq: Once | INTRAMUSCULAR | Status: AC
  Administered 2011-03-03: 10:00:00 via INTRAVENOUS

## 2011-03-03 NOTE — ED Notes (Signed)
Patient transported to CT 

## 2011-03-03 NOTE — ED Notes (Signed)
PER EMS_- Pt comes from home where he fell this morning while getting out of wheelchair. Pt hit head, NO LOC. Pt taking coumadin. CBG 91. 20 gauge L AC. NAD. Alert and oriented.

## 2011-03-03 NOTE — ED Notes (Signed)
Pt has two small abrasions to top of head as result of fall this morning. No swelling, deformity noted. Pt alert and oriented. NAD. Pt has small abrasion to right shoulder. C/o pain at right shoulder, right hip. Denies any visual changes. Vitals stable.

## 2011-03-03 NOTE — ED Provider Notes (Signed)
History     CSN: [id number] Arrival date & time: 03/03/2011  8:14 AM   First MD Initiated Contact with Patient 03/03/11 0815      Chief Complaint  Patient presents with  . Fall    (Consider location/radiation/quality/duration/timing/severity/associated sxs/prior treatment) HPI Patient was trying to get his wheelchair this morning with his wife's assistance when he leaned forward and fell out. Patient hit his head but denies any loss of consciousness. He has had a little bit of dizziness but no significant headache. Patient also fell on the shoulder and has a little bit of pain there. He denies any neck or back pain. He states he has chronic hip pain and it continues to bother him but with continued pain from the fall. His wife states that he was told that he needs a hip replacement. Patient does take Coumadin and came emergently because he been instructed if he ever fell and hit his head to come to emergency room. Past Medical History  Diagnosis Date  . Arthritis   . Diabetes mellitus     Past Surgical History  Procedure Date  . Hip surgery   . Shoulder surgery   . Pancreas surgery   . Total knee arthroplasty     No family history on file.  History  Substance Use Topics  . Smoking status: Never Smoker   . Smokeless tobacco: Not on file  . Alcohol Use:       Review of Systems  All other systems reviewed and are negative.    Allergies  Hydromorphone hcl; Morphine; and Pregabalin  Home Medications   Current Outpatient Rx  Name Route Sig Dispense Refill  . LANSOPRAZOLE 30 MG PO CPDR Oral Take 1 capsule (30 mg total) by mouth daily. 90 capsule 4    BP 117/68  Pulse 93  Temp(Src) 97.8 F (36.6 C) (Oral)  Resp 16  Ht 5\' 8"  (1.727 m)  Wt 222 lb (100.699 kg)  BMI 33.76 kg/m2  SpO2 98%  Physical Exam  Nursing note and vitals reviewed. Constitutional: He appears well-developed and well-nourished. No distress.  HENT:  Head: Normocephalic.  Right Ear:  External ear normal.  Left Ear: External ear normal.       Small amount of bruising noted around the right ear externally, no hemotympanum bilaterally  Eyes: Conjunctivae are normal. Right eye exhibits no discharge. Left eye exhibits no discharge. No scleral icterus.  Neck: Neck supple. No tracheal deviation present.  Cardiovascular: Normal rate, regular rhythm and intact distal pulses.   Pulmonary/Chest: Effort normal and breath sounds normal. No stridor. No respiratory distress. He has no wheezes. He has no rales.  Abdominal: Soft. Bowel sounds are normal. He exhibits no distension. There is no tenderness. There is no rebound and no guarding.  Musculoskeletal: He exhibits tenderness. He exhibits no edema.       Entire spine nontender, small contusion right shoulder no significant tenderness to palpation, no tenderness to palpation right hip however pain with range of motion, no shortening of the right lower chin but he noted, distal neurovascular intact  Neurological: He is alert. He has normal strength. No sensory deficit. Cranial nerve deficit:  no gross defecits noted. He exhibits normal muscle tone. He displays no seizure activity. Coordination normal.  Skin: Skin is warm and dry. No rash noted.  Psychiatric: He has a normal mood and affect.    ED Course  Procedures (including critical care time)  Labs Reviewed  CBC - Abnormal; Notable for the  following:    WBC 11.4 (*)    RBC 3.54 (*)    Hemoglobin 11.0 (*)    HCT 34.8 (*)    All other components within normal limits  DIFFERENTIAL - Abnormal; Notable for the following:    Monocytes Absolute 1.1 (*)    All other components within normal limits  BASIC METABOLIC PANEL - Abnormal; Notable for the following:    Sodium 146 (*)    Potassium 3.2 (*)    Chloride 94 (*)    CO2 44 (*)    Glucose, Bld 138 (*)    BUN 32 (*)    GFR calc non Af Amer 60 (*)    GFR calc Af Amer 69 (*)    All other components within normal limits    PROTIME-INR - Abnormal; Notable for the following:    Prothrombin Time 22.5 (*)    INR 1.94 (*)    All other components within normal limits   Dg Shoulder Right  03/03/2011  *RADIOLOGY REPORT*  Clinical Data: Right shoulder pain following a fall.  RIGHT SHOULDER - 2+ VIEW  Comparison: None.  Findings: Mild acromioclavicular spur formation.  Diffuse osteopenia.  No fracture or dislocation seen.  IMPRESSION: No fracture or dislocation.  Original Report Authenticated By: Darrol Angel, M.D.   Dg Hip Complete Right  03/03/2011  *RADIOLOGY REPORT*  Clinical Data: Fall.  Right hip pain.  RIGHT HIP - COMPLETE 2+ VIEW  Comparison: 12/24/2004 CT scan.  Findings: Flattening and partial collapse the right femoral head noted with prominent loss of articular space, subcortical sclerosis, spurring, and scattered indistinct small lucencies. Although the appearance is primarily chronic, an acute component of collapse cannot be totally excluded.  There is mild spurring of the left femoral head and along the left pubic symphysis.  The lower lumbar spondylosis noted.  IMPRESSION:  1.  Flattening and partial collapse of the right femoral head with associated asymmetric considerable degenerative findings.  The appearance is primarily chronic although an acute component of femoral head impaction/collapse cannot be totally excluded.  Original Report Authenticated By: Dellia Cloud, M.D.   Ct Head Wo Contrast  03/03/2011  *RADIOLOGY REPORT*  Clinical Data: Fall, hit head.  Patient on Coumadin.  Headache.  CT HEAD WITHOUT CONTRAST  Technique:  Contiguous axial images were obtained from the base of the skull through the vertex without contrast.  Comparison: None.  Findings: There is atrophy and chronic small vessel disease changes. No acute intracranial abnormality.  Specifically, no hemorrhage, hydrocephalus, mass lesion, acute infarction, or significant intracranial injury.  No acute calvarial abnormality.  Visualized paranasal sinuses and mastoids clear.  Orbital soft tissues unremarkable.  IMPRESSION: No acute intracranial abnormality.  Atrophy, chronic microvascular disease.  Original Report Authenticated By: Cyndie Chime, M.D.     1. Fall   2. Head injury without concussion or intracranial hemorrhage       MDM  The x-ray findings do not show any signs of significant head injury. There is no fracture of the shoulder. The hip findings have been noted. At this point I doubt an acute fracture associated with the pain is the patient states he did not really fall on his hip and this pain is no different than his usual amount. Findings have been discussed with the patient and his wife. The patient be discharged home to continue his pain medications and followup with his doctor if the symptoms persist.        Celene Kras,  MD 03/03/11 1100

## 2011-03-03 NOTE — ED Notes (Signed)
Dr.Knapp at bedside  

## 2011-03-10 ENCOUNTER — Encounter: Payer: Self-pay | Admitting: Gastroenterology

## 2011-03-10 ENCOUNTER — Ambulatory Visit (INDEPENDENT_AMBULATORY_CARE_PROVIDER_SITE_OTHER): Payer: Medicare Other | Admitting: Gastroenterology

## 2011-03-10 DIAGNOSIS — D126 Benign neoplasm of colon, unspecified: Secondary | ICD-10-CM

## 2011-03-10 DIAGNOSIS — K219 Gastro-esophageal reflux disease without esophagitis: Secondary | ICD-10-CM

## 2011-03-10 NOTE — Assessment & Plan Note (Signed)
Patient is asymptomatic on Prevacid. We'll continue with the same.

## 2011-03-10 NOTE — Progress Notes (Signed)
History of Present Illness: [name] is a 77 year old white male with history of esophageal stricture and colon polyps here for medication renewal. He has not been seen for several years. He's had an esophageal stricture complicated by food impactions in the past.  He currently denies dysphagia or pyrosis. He has a remote history of colon polyps. Last examination was 2003. The patient is wheelchair-bound, oxygen dependent and on Coumadin.    Past Medical History  Diagnosis Date  . Arthritis   . Diabetes mellitus   . Atrial fibrillation    Past Surgical History  Procedure Date  . Hip surgery   . Shoulder surgery     bilateral  . Pancreas surgery   . Total knee arthroplasty     bilateral  . Lung surgery     non-cancer  . Basil cell    family history includes Heart disease in his mother. Current Outpatient Prescriptions  Medication Sig Dispense Refill  . amLODipine (NORVASC) 5 MG tablet Take 5 mg by mouth every evening.        Marland Kitchen atorvastatin (LIPITOR) 40 MG tablet Take 40 mg by mouth every evening.        . celecoxib (CELEBREX) 100 MG capsule Take 100 mg by mouth 2 (two) times daily.        . chlordiazePOXIDE (LIBRIUM) 10 MG capsule Take 10 mg by mouth 3 (three) times daily.        . DULoxetine (CYMBALTA) 60 MG capsule Take 60 mg by mouth at bedtime.        . furosemide (LASIX) 40 MG tablet Take 40-80 mg by mouth 2 (two) times daily. Takes 80 mg in the morning and 40 mg in the evening.       Marland Kitchen glipiZIDE (GLUCOTROL) 10 MG tablet Take 10 mg by mouth 2 (two) times daily before a meal.        . insulin glargine (LANTUS) 100 UNIT/ML injection Inject 34 Units into the skin daily.        . insulin lispro (HUMALOG) 100 UNIT/ML injection Inject 2-12 Units into the skin daily before breakfast.        . isosorbide mononitrate (IMDUR) 60 MG 24 hr tablet Take 60 mg by mouth daily.        . lansoprazole (PREVACID) 30 MG capsule Take 1 capsule (30 mg total) by mouth daily.  90 capsule  4  .  meclizine (ANTIVERT) 50 MG tablet Take 50 mg by mouth 5 (five) times daily as needed. Takes 6-8 tablets daily as needed for vertigo       . metolazone (ZAROXOLYN) 5 MG tablet Take 7.5 mg by mouth daily.        Marland Kitchen olmesartan (BENICAR) 40 MG tablet Take 20 mg by mouth daily.        Marland Kitchen oxyCODONE-acetaminophen (PERCOCET) 7.5-500 MG per tablet Take 1 tablet by mouth 3 (three) times daily.        . potassium chloride SA (K-DUR,KLOR-CON) 20 MEQ tablet Take 20 mEq by mouth 2 (two) times daily.        Marland Kitchen warfarin (COUMADIN) 5 MG tablet Take 2.5-5 mg by mouth. Takes 1 tablet (5mg ) Sun, Tues, Wed, Fri and Sat. Takes 1/2 tablet (2.5mg ) Mon and Thurs.       . hyoscyamine (ANASPAZ) 0.125 MG TBDP Place under the tongue every 6 (six) hours as needed. For choking        Allergies as of 03/10/2011 - Review Complete 03/10/2011  Allergen Reaction  Noted  . Hydromorphone hcl Other (See Comments)   . Morphine Other (See Comments)   . Pregabalin Other (See Comments)     reports that he has never smoked. He has never used smokeless tobacco. He reports that he does not drink alcohol or use illicit drugs.     Review of Systems: He is hard of hearing. Ambulation is limited to to a recent fall and hip fracture. Pertinent positive and negative review of systems were noted in the above HPI section. All other review of systems were otherwise negative.  Vital signs were reviewed in today's medical record Physical Exam: General: Well developed , well nourished, no acute distress Head: Normocephalic and atraumatic Eyes:  sclerae anicteric, EOMI Ears: Normal auditory acuity Mouth: No deformity or lesions Neck: Supple, no masses or thyromegaly Lungs: Clear throughout to auscultation Heart: Regular rate and rhythm; no murmurs, rubs or bruits Abdomen: Soft, non tender and non distended. No masses, hepatosplenomegaly or hernias noted. Normal Bowel sounds Rectal:deferred Musculoskeletal: Symmetrical with no gross deformities    Skin: No lesions on visible extremities Pulses:  Normal pulses noted Extremities: No clubbing, cyanosis, edema or deformities noted Neurological: Alert oriented x 4, grossly nonfocal Cervical Nodes:  No significant cervical adenopathy Inguinal Nodes: No significant inguinal adenopathy Psychological:  Alert and cooperative. Normal mood and affect

## 2011-03-10 NOTE — Assessment & Plan Note (Signed)
In view the patient's multiple comorbidities followup colonoscopy will not be pursued

## 2012-10-04 ENCOUNTER — Telehealth: Payer: Self-pay | Admitting: Cardiovascular Disease

## 2012-10-04 NOTE — Telephone Encounter (Signed)
Wants to know if you received the fax for clarence for surgery-it was sent on 08-27-12?

## 2012-10-04 NOTE — Telephone Encounter (Signed)
Letter sent to Dr Margarita Grizzle giving clearance for sugery with lovenox bridging.  I called Frederick [name] and left a message that the letter had been sent.  I called Frederick [name] and advised her of the need for lovenox bridging.

## 2012-10-04 NOTE — Telephone Encounter (Signed)
Info sent to [employer]

## 2012-10-07 ENCOUNTER — Telehealth: Payer: Self-pay | Admitting: Cardiovascular Disease

## 2012-10-07 NOTE — Telephone Encounter (Signed)
Talked to you on Monday-concerning clarence for surgery-still have not received fax for clarence!

## 2012-10-07 NOTE — Telephone Encounter (Signed)
LM for Frederick [name] that letter was faxed again today to her attention

## 2012-10-12 ENCOUNTER — Ambulatory Visit (INDEPENDENT_AMBULATORY_CARE_PROVIDER_SITE_OTHER): Payer: Medicare Other | Admitting: Internal Medicine

## 2012-10-12 ENCOUNTER — Ambulatory Visit (HOSPITAL_COMMUNITY)
Admission: RE | Admit: 2012-10-12 | Discharge: 2012-10-12 | Disposition: A | Discharge status: Home / Self Care | Payer: Medicare Other | Source: Ambulatory Visit | Attending: Internal Medicine | Admitting: Internal Medicine

## 2012-10-12 ENCOUNTER — Encounter: Payer: Self-pay | Admitting: Internal Medicine

## 2012-10-12 VITALS — BP 100/60 | HR 83 | Temp 98.0°F | Ht 68.0 in | Wt 223.0 lb

## 2012-10-12 DIAGNOSIS — I2699 Other pulmonary embolism without acute cor pulmonale: Secondary | ICD-10-CM

## 2012-10-12 DIAGNOSIS — J9610 Chronic respiratory failure, unspecified whether with hypoxia or hypercapnia: Secondary | ICD-10-CM

## 2012-10-12 DIAGNOSIS — I10 Essential (primary) hypertension: Secondary | ICD-10-CM | POA: Insufficient documentation

## 2012-10-12 NOTE — Patient Instructions (Addendum)
Please remember to go to the  x-ray department downstairs for your tests - we will call you with the results when they are available.  You are cleared for bladder surgery

## 2012-10-12 NOTE — Progress Notes (Signed)
Subjective:    Patient ID: Frederick [name], male    DOB: April 18, 1929  MRN: [id number]  HPI  87 yowm never smoker remotely seen in pulmonary for R side effusion s/p pleurodesis by Edwyna Shell 07/2004         10/12/2012 1st pulmonary ov / 02 dep since 2008 p PE Chief Complaint  Patient presents with  . Follow-up    Referred per Dr Margarita Grizzle. Pt needs pulmonary clearance for bladder tumor removal.   sob off 02 in shower. On 02 not limited by breathing but extremely inactive Sleeps in recliner due to spine chronically  Chronic sym leg swelling x years no calf pain, no recent dopplers Needs to be off coumadin for bladder surgery due to ongoing hematuria from bladder tumor  No obvious daytime variabilty or assoc chronic cough or cp or chest tightness, subjective wheeze overt sinus or hb symptoms. No unusual exp hx or h/o childhood pna/ asthma or knowledge of premature birth.    Sleeping ok in recliner without nocturnal  or early am exacerbation  of respiratory  c/o's or need for noct saba. 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/04  OSTEOARTHRITIS (ICD-715.90)  DIABETES MELLITUS, TYPE II, ON INSULIN (ICD-250.00)  GERD (ICD-530.81)....................................................................................Marland KitchenArlyce Dice  - EGD 06/22/04 with stricture  FOREIGN BODY IN ESOPHAGUS (ICD-935.1)  COLONIC POLYPS (ICD-211.3)  DIVERTICULOSIS OF COLON (ICD-562.10)  GASTRITIS (ICD-535.50)  MORBID OBESITY  - PFTs and 04/13/08 FEV1 54%, ratio 76% BRB 16% DLCO 66% > c/w restrictive changes from obesity   Family History:   ht dz mother and father  melanoma in father    Social  History:   quit smoking 1952   Review of Systems  Constitutional: Negative for fever, chills, activity change, appetite change and unexpected weight change.  HENT: Negative for congestion, sore throat, rhinorrhea, sneezing, trouble swallowing, dental problem, voice change and postnasal drip.   Eyes: Negative for visual disturbance.  Respiratory: Negative for cough, choking and shortness of breath.   Cardiovascular: Positive for leg swelling. Negative for chest pain.  Gastrointestinal: Negative for nausea, vomiting and abdominal pain.  Genitourinary: Negative for difficulty urinating.  Musculoskeletal: Positive for arthralgias.  Skin: Negative for rash.  Psychiatric/Behavioral: Positive for dysphoric mood. Negative for behavioral problems and confusion.       Objective:   Physical Exam   W/c bound hoarse wm nad Wt Readings from Last 3 Encounters:  10/12/12 223 lb (101.152 kg)  03/03/11 222 lb (100.699 kg)  04/13/08 222 lb (100.699 kg)    HEENT: nl dentition, turbinates, and orophanx. Nl external ear canals without cough reflex   NECK :  without JVD/Nodes/TM/ nl carotid upstrokes bilaterally   LUNGS: no acc muscle use, clear to A and P bilaterally without cough on insp or exp maneuvers   CV:  RRR  no s3 or murmur or increase in P2,  1 Plus pitting edema both lower ext  ABD:  soft and nontender with nl excursion in the supine position. No bruits or organomegaly, bowel sounds nl  MS:  warm without deformities, calf tenderness, cyanosis or clubbing  SKIN: warm and dry without lesions    NEURO:  alert, approp, no deficits   cxr 10/12/12  No acute cardiopulmonary disease.  Assessment & Plan:

## 2012-10-12 NOTE — Assessment & Plan Note (Addendum)
-   02 dep 2lpm since 2008 24/7   C/w effects of obesity/ immobility and prev L fibrothorax surgery and not a contraindication to surgery

## 2012-10-13 NOTE — Progress Notes (Signed)
Quick Note:  Spoke with pt and notified of results per Dr. Wert. Pt verbalized understanding and denied any questions.  ______ 

## 2012-10-14 DIAGNOSIS — I2699 Other pulmonary embolism without acute cor pulmonale: Secondary | ICD-10-CM | POA: Insufficient documentation

## 2012-10-14 NOTE — Assessment & Plan Note (Addendum)
Ideally before stopping coumadin for any length of time needs echo to check RV reserve and venous dopplers.  If just needed for the duration of surgery and restart after can safely bridge with lovenox in this setting so as I understand the planned surgery this should be fine s the echo and dopplers first - if more extensive surgery being considered and would need to be off coumadin longer would need these studies done first   Discussed in detail all the  Indications (as I understand them per the urology note dated 09/24/12), usual  risks and alternatives  relative to the benefits with patient who agrees to proceed with procedure and short term (5 d) d/c coumadin

## 2012-10-14 NOTE — Telephone Encounter (Signed)
Message forwarded to Medical Records to process request.  Letter already written in Epic.

## 2012-10-14 NOTE — Telephone Encounter (Signed)
Wife walked in today to check the status of the clearance note. She stated that she spoke with [facility] today, and was told that they never received it. Wife would like to have another copy today or fax one to [phone], asap. Please advise wife today.

## 2012-10-15 ENCOUNTER — Other Ambulatory Visit: Payer: Self-pay | Admitting: Urology

## 2012-10-26 ENCOUNTER — Encounter (HOSPITAL_COMMUNITY): Payer: Self-pay | Admitting: Pharmacy Technician

## 2012-10-28 ENCOUNTER — Encounter (HOSPITAL_COMMUNITY): Payer: Self-pay

## 2012-10-28 ENCOUNTER — Encounter (HOSPITAL_COMMUNITY)
Admission: RE | Admit: 2012-10-28 | Discharge: 2012-10-28 | Disposition: A | Discharge status: Home / Self Care | Payer: Medicare Other | Source: Ambulatory Visit | Attending: Urology | Admitting: Urology

## 2012-10-28 DIAGNOSIS — Z01812 Encounter for preprocedural laboratory examination: Secondary | ICD-10-CM | POA: Insufficient documentation

## 2012-10-28 HISTORY — DX: Neoplasm of unspecified behavior of bladder: D49.4

## 2012-10-28 HISTORY — DX: Unspecified hearing loss, unspecified ear: H91.90

## 2012-10-28 HISTORY — DX: Peripheral vascular disease, unspecified: I73.9

## 2012-10-28 HISTORY — DX: Pneumonia, unspecified organism: J18.9

## 2012-10-28 HISTORY — DX: Edema, unspecified: R60.9

## 2012-10-28 HISTORY — DX: Gastro-esophageal reflux disease without esophagitis: K21.9

## 2012-10-28 HISTORY — DX: Malignant (primary) neoplasm, unspecified: C80.1

## 2012-10-28 HISTORY — DX: Atherosclerotic heart disease of native coronary artery without angina pectoris: I25.10

## 2012-10-28 HISTORY — DX: Anxiety disorder, unspecified: F41.9

## 2012-10-28 HISTORY — DX: Failed or difficult intubation, initial encounter: T88.4XXA

## 2012-10-28 HISTORY — DX: Major depressive disorder, single episode, unspecified: F32.9

## 2012-10-28 HISTORY — DX: Depression, unspecified: F32.A

## 2012-10-28 HISTORY — DX: Shortness of breath: R06.02

## 2012-10-28 LAB — BASIC METABOLIC PANEL
CO2: >45 mEq/L (ref 19–32)
Calcium: 10.2 mg/dL (ref 8.4–10.5)
Creatinine, Ser: 1.15 mg/dL (ref 0.50–1.35)
GFR calc Af Amer: 67 mL/min — ABNORMAL LOW (ref >90)
GFR calc non Af Amer: 58 mL/min — ABNORMAL LOW (ref >90)
Sodium: 142 mEq/L (ref 135–145)

## 2012-10-28 LAB — APTT: aPTT: 38 seconds — ABNORMAL HIGH (ref 24–37)

## 2012-10-28 LAB — CBC
MCH: 31 pg (ref 26.0–34.0)
Platelets: 341 10*3/uL (ref 150–400)
RBC: 3.93 MIL/uL — ABNORMAL LOW (ref 4.22–5.81)
RDW: 13.5 % (ref 11.5–15.5)

## 2012-10-28 LAB — PROTIME-INR: Prothrombin Time: 22.8 seconds — ABNORMAL HIGH (ref 11.6–15.2)

## 2012-10-28 NOTE — Patient Instructions (Addendum)
YOUR SURGERY IS SCHEDULED AT [facility]  ON:   Wednesday 7/30  REPORT TO [place] [facility] AT:  10:00 AM      PHONE # FOR SHORT STAY IS [phone]  DO NOT EAT  ANYTHING AFTER MIDNIGHT THE NIGHT BEFORE YOUR SURGERY.   NO FOOD, NO CHEWING GUM, NO MINTS, NO CANDIES, NO CHEWING TOBACCO. YOU MAY HAVE CLEAR LIQUIDS TO DRINK FROM MIDNIGHT UNTIL 6:30 AM THE DAY OF SURGERY - LIKE WATER, COKE.  NOTHING TO DRINK AFTER 6:30 AM THE DAY OF SURGERY.   PLEASE TAKE THE FOLLOWING MEDICATIONS THE AM OF YOUR SURGERY WITH A FEW SIPS OF WATER:  CHLORDIAZEPAM ( LIBRUM ),  HYDROCODONE / ACETAMINOPHEN,  LANSOPRAZOLE ( PREVACID )    IF YOU ARE DIABETIC:  DO NOT TAKE ANY DIABETIC MEDICATIONS THE AM OF YOUR SURGERY.  IF YOU TAKE INSULIN IN THE EVENINGS--PLEASE ONLY TAKE 1/2 NORMAL EVENING DOSE THE NIGHT BEFORE YOUR SURGERY.  NO INSULIN THE AM OF YOUR SURGERY.   DO NOT BRING VALUABLES, MONEY, CREDIT CARDS.  DO NOT WEAR JEWELRY, MAKE-UP, NAIL POLISH AND NO METAL PINS OR CLIPS IN YOUR HAIR. CONTACT LENS, DENTURES / PARTIALS, GLASSES SHOULD NOT BE WORN TO SURGERY AND IN MOST CASES-HEARING AIDS WILL NEED TO BE REMOVED.  BRING YOUR GLASSES CASE, ANY EQUIPMENT NEEDED FOR YOUR CONTACT LENS. FOR PATIENTS ADMITTED TO THE HOSPITAL--CHECK OUT TIME THE DAY OF DISCHARGE IS 11:00 AM.  ALL INPATIENT ROOMS ARE PRIVATE - WITH BATHROOM, TELEPHONE, TELEVISION AND WIFI INTERNET.  IF YOU ARE BEING DISCHARGED THE SAME DAY OF YOUR SURGERY--YOU CAN NOT DRIVE YOURSELF HOME--AND SHOULD NOT GO HOME ALONE BY TAXI OR BUS.  NO DRIVING OR OPERATING MACHINERY FOR 24 HOURS FOLLOWING ANESTHESIA / PAIN MEDICATIONS.  PLEASE MAKE ARRANGEMENTS FOR SOMEONE TO BE WITH YOU AT HOME THE FIRST 24 HOURS AFTER SURGERY. RESPONSIBLE DRIVER'S NAME___________________________                                               PHONE #   _______________________                              FAILURE TO FOLLOW THESE INSTRUCTIONS MAY RESULT IN THE  CANCELLATION OF YOUR SURGERY.   PATIENT SIGNATURE_________________________________

## 2012-10-28 NOTE — Progress Notes (Signed)
10/28/12 1030  OBSTRUCTIVE SLEEP APNEA  Have you ever been diagnosed with sleep apnea through a sleep study? No  Do you snore loudly (loud enough to be heard through closed doors)?  0  Do you often feel tired, fatigued, or sleepy during the daytime? 1  Has anyone observed you stop breathing during your sleep? 0  Do you have, or are you being treated for high blood pressure? 1  BMI more than 35 kg/m2? 1  Age over 77 years old? 1  Neck circumference greater than 40 cm/18 inches? 1  Gender: 1  Obstructive Sleep Apnea Score 6  Score 4 or greater  Results sent to PCP

## 2012-10-29 ENCOUNTER — Encounter (HOSPITAL_COMMUNITY): Payer: Self-pay

## 2012-10-29 NOTE — Pre-Procedure Instructions (Signed)
DEBBIE CALLED FROM [facility] - SAID PT HAD CRITICAL LAB VALUE  CO2 GREATER THAN 45.  I CALLED REPORT TO GWYN AT DR. [id number] OFFICE - HE IS IN OFFICE AND SHE WILL LET HIM KNOW OF ABNORMAL CO2 AND I FAXED TO DR. [id number]'Y OFFICE ALL PREOP LABS - BMET, CBC, PT, PTT  WITH NOTE THAT PT, INR WILL BE REPEATED DAY OF SURGERY.

## 2012-10-29 NOTE — Pre-Procedure Instructions (Signed)
PT'S ANESTHESIA RECORD FROM 08/04/2005 VATS SURGERY WITH DR. Edwyna Shell - HX DIFFICULT INTUBATION - RECORD OBTAINED FROM [facility] AND PLACED ON PT'S CHART.

## 2012-10-29 NOTE — Pre-Procedure Instructions (Signed)
GWYN CALLED BACK FROM DR. Hilario Quarry OFFICE - HE HAS REVIEWED PT'S CRITICAL LAB VALUE CO2 GREATER THAN 45 - NO ACTION NEEDED. PT HAS OFFICE NOTE AND EKG 07/28/12 AND CARDIAC CLEARANCE NOTE 10/04/12 FROM DR. BERRY  - ON HIS CHART. PT HAS OFFICE NOTE AND CXR REPORT WITH PULMONARY CLEARANCE 10/12/12 FROM DR. WERT ON HIS CHART.

## 2012-11-03 ENCOUNTER — Encounter (HOSPITAL_COMMUNITY)
Admission: RE | Disposition: A | Discharge status: Home / Self Care | Payer: Self-pay | Source: Ambulatory Visit | Attending: Urology

## 2012-11-03 ENCOUNTER — Encounter (HOSPITAL_COMMUNITY): Payer: Self-pay | Admitting: *Deleted

## 2012-11-03 ENCOUNTER — Encounter (HOSPITAL_COMMUNITY): Payer: Self-pay | Admitting: Anesthesiology

## 2012-11-03 ENCOUNTER — Ambulatory Visit (HOSPITAL_COMMUNITY)
Admission: RE | Admit: 2012-11-03 | Discharge: 2012-11-03 | Disposition: A | Discharge status: Home / Self Care | Payer: Medicare Other | Source: Ambulatory Visit | Attending: Urology | Admitting: Urology

## 2012-11-03 ENCOUNTER — Ambulatory Visit (HOSPITAL_COMMUNITY): Payer: Medicare Other | Admitting: Anesthesiology

## 2012-11-03 DIAGNOSIS — E7800 Pure hypercholesterolemia, unspecified: Secondary | ICD-10-CM | POA: Insufficient documentation

## 2012-11-03 DIAGNOSIS — C676 Malignant neoplasm of ureteric orifice: Secondary | ICD-10-CM | POA: Insufficient documentation

## 2012-11-03 DIAGNOSIS — N3000 Acute cystitis without hematuria: Secondary | ICD-10-CM | POA: Insufficient documentation

## 2012-11-03 DIAGNOSIS — C672 Malignant neoplasm of lateral wall of bladder: Secondary | ICD-10-CM | POA: Insufficient documentation

## 2012-11-03 DIAGNOSIS — I2510 Atherosclerotic heart disease of native coronary artery without angina pectoris: Secondary | ICD-10-CM | POA: Insufficient documentation

## 2012-11-03 DIAGNOSIS — K219 Gastro-esophageal reflux disease without esophagitis: Secondary | ICD-10-CM | POA: Insufficient documentation

## 2012-11-03 DIAGNOSIS — I739 Peripheral vascular disease, unspecified: Secondary | ICD-10-CM | POA: Insufficient documentation

## 2012-11-03 DIAGNOSIS — E119 Type 2 diabetes mellitus without complications: Secondary | ICD-10-CM | POA: Insufficient documentation

## 2012-11-03 DIAGNOSIS — I10 Essential (primary) hypertension: Secondary | ICD-10-CM | POA: Insufficient documentation

## 2012-11-03 DIAGNOSIS — C679 Malignant neoplasm of bladder, unspecified: Secondary | ICD-10-CM | POA: Insufficient documentation

## 2012-11-03 DIAGNOSIS — Z7901 Long term (current) use of anticoagulants: Secondary | ICD-10-CM | POA: Insufficient documentation

## 2012-11-03 DIAGNOSIS — Z79899 Other long term (current) drug therapy: Secondary | ICD-10-CM | POA: Insufficient documentation

## 2012-11-03 DIAGNOSIS — Z86711 Personal history of pulmonary embolism: Secondary | ICD-10-CM | POA: Insufficient documentation

## 2012-11-03 DIAGNOSIS — D494 Neoplasm of unspecified behavior of bladder: Secondary | ICD-10-CM

## 2012-11-03 DIAGNOSIS — I4891 Unspecified atrial fibrillation: Secondary | ICD-10-CM | POA: Insufficient documentation

## 2012-11-03 DIAGNOSIS — E669 Obesity, unspecified: Secondary | ICD-10-CM | POA: Insufficient documentation

## 2012-11-03 DIAGNOSIS — N3020 Other chronic cystitis without hematuria: Secondary | ICD-10-CM | POA: Insufficient documentation

## 2012-11-03 HISTORY — PX: CYSTOGRAM: SHX6285

## 2012-11-03 HISTORY — PX: CYSTOSCOPY/RETROGRADE/URETEROSCOPY: SHX5316

## 2012-11-03 HISTORY — PX: TRANSURETHRAL RESECTION OF BLADDER TUMOR WITH GYRUS (TURBT-GYRUS): SHX6458

## 2012-11-03 LAB — GLUCOSE, CAPILLARY
Glucose-Capillary: 127 mg/dL — ABNORMAL HIGH (ref 70–99)
Glucose-Capillary: 120 mg/dL — ABNORMAL HIGH (ref 70–99)

## 2012-11-03 LAB — PROTIME-INR: INR: 0.99 (ref 0.00–1.49)

## 2012-11-03 SURGERY — TRANSURETHRAL RESECTION OF BLADDER TUMOR WITH GYRUS (TURBT-GYRUS)
Anesthesia: General | Wound class: Clean Contaminated

## 2012-11-03 MED ORDER — LIDOCAINE HCL 2 % EX GEL
CUTANEOUS | Status: DC | PRN
  Administered 2012-11-03: 1 via URETHRAL

## 2012-11-03 MED ORDER — SODIUM CHLORIDE 0.9 % IR SOLN
Status: DC | PRN
  Administered 2012-11-03: 12000 mL

## 2012-11-03 MED ORDER — HYDROCODONE-ACETAMINOPHEN 5-325 MG PO TABS
ORAL_TABLET | ORAL | Status: AC
Start: 2012-11-03 — End: 2012-11-03
  Administered 2012-11-03: 1
  Filled 2012-11-03: qty 1

## 2012-11-03 MED ORDER — BELLADONNA ALKALOIDS-OPIUM 16.2-60 MG RE SUPP
RECTAL | Status: DC | PRN
  Administered 2012-11-03: 1 via RECTAL

## 2012-11-03 MED ORDER — FENTANYL CITRATE 0.05 MG/ML IJ SOLN: 25.0000 ug | INTRAMUSCULAR | Status: DC | PRN

## 2012-11-03 MED ORDER — CEPHALEXIN 500 MG PO CAPS: 500.0000 mg | ORAL_CAPSULE | Freq: Three times a day (TID) | ORAL | Status: DC

## 2012-11-03 MED ORDER — SENNOSIDES-DOCUSATE SODIUM 8.6-50 MG PO TABS: 1.0000 | ORAL_TABLET | Freq: Two times a day (BID) | ORAL | Status: DC

## 2012-11-03 MED ORDER — ONDANSETRON HCL 4 MG/2ML IJ SOLN
INTRAMUSCULAR | Status: DC | PRN
  Administered 2012-11-03: 4 mg via INTRAVENOUS

## 2012-11-03 MED ORDER — LIDOCAINE HCL 2 % EX GEL
CUTANEOUS | Status: AC
  Filled 2012-11-03: qty 10

## 2012-11-03 MED ORDER — CEFAZOLIN SODIUM-DEXTROSE 2-3 GM-% IV SOLR
INTRAVENOUS | Status: AC
  Filled 2012-11-03: qty 50

## 2012-11-03 MED ORDER — IOHEXOL 300 MG/ML  SOLN
INTRAMUSCULAR | Status: DC | PRN
  Administered 2012-11-03: 19 mL

## 2012-11-03 MED ORDER — IOHEXOL 300 MG/ML  SOLN
INTRAMUSCULAR | Status: AC
  Filled 2012-11-03: qty 1

## 2012-11-03 MED ORDER — HYDROCODONE-ACETAMINOPHEN 5-325 MG PO TABS: 1.0000 | ORAL_TABLET | ORAL | Status: DC | PRN

## 2012-11-03 MED ORDER — DIATRIZOATE MEGLUMINE 30 % UR SOLN
URETHRAL | Status: DC | PRN
  Administered 2012-11-03: 300 mL via URETHRAL

## 2012-11-03 MED ORDER — FENTANYL CITRATE 0.05 MG/ML IJ SOLN
INTRAMUSCULAR | Status: DC | PRN
  Administered 2012-11-03 (×2): 25 ug via INTRAVENOUS

## 2012-11-03 MED ORDER — CEFAZOLIN SODIUM-DEXTROSE 2-3 GM-% IV SOLR
2.0000 g | INTRAVENOUS | Status: AC
  Administered 2012-11-03: 2 g via INTRAVENOUS

## 2012-11-03 MED ORDER — STERILE WATER FOR IRRIGATION IR SOLN
Status: DC | PRN
  Administered 2012-11-03: 500 mL

## 2012-11-03 MED ORDER — BELLADONNA ALKALOIDS-OPIUM 16.2-60 MG RE SUPP
RECTAL | Status: AC
  Filled 2012-11-03: qty 1

## 2012-11-03 MED ORDER — 0.9 % SODIUM CHLORIDE (POUR BTL) OPTIME
TOPICAL | Status: DC | PRN
  Administered 2012-11-03: 1000 mL

## 2012-11-03 MED ORDER — LACTATED RINGERS IV SOLN
INTRAVENOUS | Status: DC
  Administered 2012-11-03: 1000 mL via INTRAVENOUS

## 2012-11-03 MED ORDER — PROPOFOL 10 MG/ML IV BOLUS
INTRAVENOUS | Status: DC | PRN
  Administered 2012-11-03: 150 mg via INTRAVENOUS

## 2012-11-03 MED ORDER — HYOSCYAMINE SULFATE 0.125 MG PO TABS: 0.1250 mg | ORAL_TABLET | ORAL | Status: DC | PRN

## 2012-11-03 MED ORDER — PHENAZOPYRIDINE HCL 100 MG PO TABS: 100.0000 mg | ORAL_TABLET | Freq: Three times a day (TID) | ORAL | Status: DC | PRN

## 2012-11-03 SURGICAL SUPPLY — 20 items
BAG URINE DRAINAGE (UROLOGICAL SUPPLIES) ×2 IMPLANT
BAG URO CATCHER STRL LF (DRAPE) ×4 IMPLANT
BASKET ZERO TIP NITINOL 2.4FR (BASKET) IMPLANT
BSKT STON RTRVL ZERO TP 2.4FR (BASKET)
CATH HEMA 3WAY 30CC 24FR COUDE (CATHETERS) ×2 IMPLANT
CATH URET 5FR 28IN OPEN ENDED (CATHETERS) ×2 IMPLANT
CLOTH BEACON ORANGE TIMEOUT ST (SAFETY) ×4 IMPLANT
DRAPE CAMERA CLOSED 9X96 (DRAPES) ×4 IMPLANT
GLOVE BIOGEL M 7.0 STRL (GLOVE) ×2 IMPLANT
GOWN STRL NON-REIN LRG LVL3 (GOWN DISPOSABLE) ×4 IMPLANT
GUIDEWIRE ANG ZIPWIRE 038X150 (WIRE) IMPLANT
GUIDEWIRE STR DUAL SENSOR (WIRE) ×4 IMPLANT
MANIFOLD NEPTUNE II (INSTRUMENTS) ×4 IMPLANT
PACK CYSTO (CUSTOM PROCEDURE TRAY) ×4 IMPLANT
SCRUB PCMX 4 OZ (MISCELLANEOUS) ×2 IMPLANT
SHEATH URET ACCESS 12FR/35CM (UROLOGICAL SUPPLIES) ×2 IMPLANT
STENT CONTOUR 6FRX24X.038 (STENTS) ×2 IMPLANT
SYR 30ML LL (SYRINGE) ×2 IMPLANT
SYRINGE IRR TOOMEY STRL 70CC (SYRINGE) ×2 IMPLANT
TUBING CONNECTING 10 (TUBING) ×4 IMPLANT

## 2012-11-03 NOTE — Progress Notes (Signed)
Went over d/c instructions with pt's wife.  Foley catheter in place, draining red clear urine.  Instructed wife on how to empty catheter.  Informed pt's wife about specific instructions regarding prescriptions.  Pt pain improved after received 1 hydrocodone.

## 2012-11-03 NOTE — Transfer of Care (Signed)
Immediate Anesthesia Transfer of Care Note  Patient: Frederick [name]  Procedure(s) Performed: Procedure(s): TRANSURETHRAL RESECTION OF BLADDER TUMOR WITH GYRUS (TURBT-GYRUS) (N/A) CYSTOSCOPY/RETROGRADE/ LEFT URETEROSCOPY AND STENT PLACEMENT CYSTOGRAM (N/A)  Patient Location: PACU  Anesthesia Type:General  Level of Consciousness: awake, alert , oriented and patient cooperative  Airway & Oxygen Therapy: Patient Spontanous Breathing and Patient connected to face mask oxygen  Post-op Assessment: Report given to PACU RN and Post -op Vital signs reviewed and stable  Post vital signs: Reviewed and stable  Complications: No apparent anesthesia complications

## 2012-11-03 NOTE — Progress Notes (Signed)
Patient stated he felt his blood sugar was low. His wife said his head was sweating, but denies additional symptoms. CBG rechecked resulted at 127. Patient stated he felt better knowing it was normal and thinks he may be "anxious and hungry". Emotional support provided. Will continue to monitor.

## 2012-11-03 NOTE — Anesthesia Preprocedure Evaluation (Addendum)
Anesthesia Evaluation  Patient identified by MRN, date of birth, ID band Patient awake    Reviewed: Allergy & Precautions, H&P , NPO status , Patient's Chart, lab work & pertinent test results  History of Anesthesia Complications (+) DIFFICULT AIRWAY  Airway Mallampati: II TM Distance: >3 FB Neck ROM: Full    Dental no notable dental hx.    Pulmonary shortness of breath, pneumonia -, resolved, PE breath sounds clear to auscultation  Pulmonary exam normal       Cardiovascular hypertension, Pt. on medications + CAD and + Peripheral Vascular Disease + dysrhythmias Atrial Fibrillation Rhythm:Regular Rate:Normal     Neuro/Psych PSYCHIATRIC DISORDERS Anxiety Depression  Neuromuscular disease    GI/Hepatic Neg liver ROS, GERD-  Medicated,  Endo/Other  negative endocrine ROSdiabetes, Type 1, Insulin Dependent  Renal/GU Renal disease  negative genitourinary   Musculoskeletal negative musculoskeletal ROS (+)   Abdominal (+) + obese,   Peds negative pediatric ROS (+)  Hematology negative hematology ROS (+)   Anesthesia Other Findings   Reproductive/Obstetrics negative OB ROS                           Anesthesia Physical Anesthesia Plan  ASA: IV  Anesthesia Plan: General   Post-op Pain Management:    Induction: Intravenous  Airway Management Planned: LMA  Additional Equipment:   Intra-op Plan:   Post-operative Plan: Extubation in OR  Informed Consent: I have reviewed the patients History and Physical, chart, labs and discussed the procedure including the risks, benefits and alternatives for the proposed anesthesia with the patient or authorized representative who has indicated his/her understanding and acceptance.   Dental advisory given  Plan Discussed with: CRNA  Anesthesia Plan Comments: (Difficult airway, but good mask airway in 2007)       Anesthesia Quick Evaluation

## 2012-11-03 NOTE — H&P (Signed)
Urology History and Physical Exam  CC: Bladder tumor.  HPI: 79 year old malepresents today for bladder tumor. This was discovered during workup for gross hematuria. Today he plans on proceeding with cystoscopy, transurethral resection of bladder tumor, bilateral retrograde pyelograms, and rectal examination under anesthesia. His bladder tumor was discovered 09/24/12. This was seen involving the floor of the bladder in the left lateral wall. This was larger than 2 cm and could be possibly larger than 5 cm. We will get a better view once we entered the bladder today. This was a sessile tumor. He was worked up with a CT hematuria protocol which was negative for any concerning findings other than a possible bladder tumor which we knew RAD existed. He has a history of pulmonary embolism and was cleared by his pulmonologist, Dr. Shona Simpson, to be off of his Coumadin with a Lovenox bridge. He was also cleared by his cardiologist, Dr. Gery Pray warfarin for 5 days prior to the procedure with Lovenox bridge. He currently has an indwelling catheter. He has intermittent gross hematuria.   He is being bridged with lovenox. He stopped his warfarin 5 days ago; last lovenox was last night. Lovenox being managed by Virgina Evener at [facility]. INR today 0.99.  UA 09/24/12 negative for infection.  PMH: Past Medical History  Diagnosis Date  . Diabetes mellitus   . Hypertension   . Hypercholesterolemia   . Esophageal stricture   . Pulmonary embolism 2008    chronic coumadin   . Coronary artery disease     DR. BERRY IS PT'S CARDIOLOGIST  . Atrial fibrillation     CHRONIC COUMADIN  . Anxiety     SINCE 1975   . Depression     SINCE 1975    . Shortness of breath     NOT SURE WHAT CAUSES SOB - BUT HE IS NOT ABLE TO BE ACTIVE - AND IS SOB WITH ANY ACTIVITY - USES OXYGEN 2 L NASAL CANNULA - ALL THE TIME -EXCEPT WHEN IN SHOWER OR CHANGING CLOTHES  . Peripheral vascular disease     TOLD SOME SMALL AMOUNT OF  BLOCKAGE IN LEG WHEN HEART CATH DONE 2008  . Bladder tumor     HAS HAD HEMATURIA OFF AND ON - HAS FOLEY CATH THAT WAS PLACED JUNE 20TH, 2014  . GERD (gastroesophageal reflux disease)   . Edema     FEET AND LEGS MOST DAYS - SOMETIMES WEARS COMPRESSION HOSE  . Cancer     SKIN CANCERS  . Arthritis     S/P BILATERAL TOTAL KNEE REPLACEMENTS - BUT BOTH KNEES PAINFUL AND JOINTS WORN OUT; BAD RIGHT HIP - BUT NOT A CANDIDATE FOR HIP PREPLACMENT;  HAS SEVERE LOWER  BACK AND LEG PAIN - HAS SPINAL STENOSIS AND BULGING DISC; CURVATURE OF UPPER SPINE - UNABLE TO LAY HIS HEAD FLAT.  Marland Kitchen Pneumonia 2005  . Difficult intubation     DUE TO LIMITED NECK FLEXION AND SHORT NECK--ANESTHESIA RECORD FROM 2007 VATS SURGERY AT [facility] OBTAINED AND ON PT'S CHART.  Marland Kitchen Problems with hearing     WEARS BILATERAL HEARING AIDS    PSH: Past Surgical History  Procedure Laterality Date  . Shoulder surgery      bilateral  . Pancreas surgery  2001    TUMOR REMOVED FROM PANCREAS AND SPLEEN ALSO REMOVED  . Total knee arthroplasty      bilateral  . Lung surgery  2007    non-cancer  VIDEO ASSISTED THORCOTOMY TO REMOVE FLUID /  DECORTICATION. DR. Edwyna Shell  . Basil cell    . Cardiac catheterization  2008  . Esophageal stretch    . Skin cancer removed from left side of head - required skin graft from left leg    . Cartilage repair 198- left knee    . Joint replacement      Allergies: Allergies  Allergen Reactions  . Hydromorphone Hcl Other (See Comments)    Dilaudid caused Confusion   . Morphine Other (See Comments)    confusion  . Pregabalin Other (See Comments)    lyrica - caused hallucinations    Medications: Prescriptions prior to admission  Medication Sig Dispense Refill  . atorvastatin (LIPITOR) 40 MG tablet Take 40 mg by mouth every evening.        . celecoxib (CELEBREX) 100 MG capsule Take 100 mg by mouth 2 (two) times daily.        . chlordiazePOXIDE (LIBRIUM) 10 MG capsule Take 10 mg by mouth 3 (three) times  daily.        . DULoxetine (CYMBALTA) 60 MG capsule Take 60 mg by mouth at bedtime.        . enoxaparin (LOVENOX) 40 MG/0.4ML injection Inject 40 mg into the skin daily. Stops coumadin for surgery - last day to take coumadin is 10/28/12. Starts lovenox bridge once a day in the am  -first dose is 10/29/12 --last dose is 11/02/12--no lovenox the day of surgery.  Told to resume lovenox and coumadin both - the day after surgery.  Pt to go to [facility] coumadin clinic on Monday 8/4 for pt, inr check.      . furosemide (LASIX) 40 MG tablet Take 40-80 mg by mouth 2 (two) times daily. Takes 80 mg in the morning and 40 mg in the evening.       Marland Kitchen glipiZIDE (GLUCOTROL) 10 MG tablet Take 10 mg by mouth daily.       . hydrocodone-acetaminophen (LORCET-HD) 5-500 MG per capsule Take 1 capsule by mouth every 6 (six) hours as needed for pain.      Marland Kitchen insulin glargine (LANTUS) 100 UNIT/ML injection Inject 46 Units into the skin daily. Daily with breakfast      . insulin lispro (HUMALOG) 100 UNIT/ML injection Inject 2-4 Units into the skin daily before breakfast. Sliding scale      . irbesartan (AVAPRO) 150 MG tablet Take 150 mg by mouth daily with breakfast.      . isosorbide mononitrate (IMDUR) 60 MG 24 hr tablet Take 60 mg by mouth daily with supper.       . liver oil-zinc oxide (DESITIN) 40 % ointment Apply 1 application topically daily as needed for dry skin. Apply to bottom      . metolazone (ZAROXOLYN) 5 MG tablet Take 7.5 mg by mouth daily.        . potassium chloride SA (K-DUR,KLOR-CON) 20 MEQ tablet Take 20 mEq by mouth 2 (two) times daily.        . lansoprazole (PREVACID) 30 MG capsule Take 1 capsule (30 mg total) by mouth daily.  90 capsule  4  . warfarin (COUMADIN) 5 MG tablet Take 5 mg by mouth daily.          Social History: History   Social History  . Marital Status: Married    Spouse Name: N/A    Number of Children: N/A  . Years of Education: N/A   Occupational History  . Retired     Social History Main  Topics  . Smoking status: Never Smoker   . Smokeless tobacco: Never Used  . Alcohol Use: No  . Drug Use: No  . Sexually Active: Not on file   Other Topics Concern  . Not on file   Social History Narrative  . No narrative on file    Family History: Family History  Problem Relation Age of Onset  . Heart disease Mother   . Melanoma Father     Review of Systems: Positive: SOB (at baseline), gross hematuria. Negative: Chest pain, fever, or nausea.  A further 10 point review of systems was negative except what is listed in the HPI.  Physical Exam: Filed Vitals:   11/03/12 0942  BP: 129/86  Pulse: 83  Temp: 98 F (36.7 C)  Resp: 15    General: No acute distress.  Awake. Head:  Normocephalic.  Atraumatic. ENT:  EOMI.  Mucous membranes moist Neck:  Supple.  No lymphadenopathy. CV:  Irregularly irregular rate. Good BUE pulses. Pulmonary: Equal effort bilaterally.  Clear to auscultation bilaterally. Abdomen: Soft.  Non- tender to palpation. Skin:  Normal turgor.  No visible rash. Extremity: No gross deformity of bilateral upper extremities.  No gross deformity of    bilateral lower extremities. Neurologic: Alert. Appropriate mood.    Studies:  No results found for this basename: HGB, WBC, PLT,  in the last 72 hours  No results found for this basename: NA, K, CL, CO2, BUN, CREATININE, CALCIUM, MAGNESIUM, GFRNONAA, GFRAA,  in the last 72 hours   Recent Labs     11/03/12  1035  INR  0.99     No components found with this basename: ABG,     Assessment:  Bladder tumor.  Plan: To OR for cystoscopy, transurethral resection of bladder tumor, bilateral retrograde pyelograms, and rectal examination under anesthesia.

## 2012-11-03 NOTE — Op Note (Signed)
Urology Operative Report  Date of Procedure: 11/03/12  Surgeon: Natalia Leatherwood, MD Assistant:  None  Preoperative Diagnosis: Bladder tumor Postoperative Diagnosis:  Same  Procedure(s): Transurethral resection of bladder tumor (>2 cm but <5 cm) Left diagnostic ureteroscopy Left ureter stent placement Bilateral retrograde pyelogram with interpretation Cystogram with interpretation Rectal exam under anesthesia.  Estimated blood loss: Minimal  Specimen: Bladder tumor  Drains: Foley (24Fr 3-way coude tip hematuria catheter- 15cc in balloon)  Complications: None  Findings: Medium sized bladder tumor measuring larger than 2 cm and less than 5 cm in the bladder. There were also smaller tumors involving the posterior bladder wall in the area around the right ureter orifice as well as the area around the left ureter orifice.  Negative filling defects in bilateral extra diagrams.  Negative tumor in the left distal ureter on ureteroscopy.  Cystogram negative for transitional or bladder perforation.  History of present illness: 79 year old male presented to my clinic with gross hematuria. Workup revealed a medium-sized bladder tumor. He presents today for resection of bladder tumor with clearance from his pulmonologist and cardiologist. He is being managed with a Lovenox bridge.   Procedure in detail: After informed consent was obtained, the patient was taken to the operating room. They were placed in the supine position. SCDs were turned on and in place. IV antibiotics were infused, and general anesthesia was induced. A timeout was performed in which the correct patient, surgical site, and procedure were identified and agreed upon by the team.  The patient was placed in a dorsolithotomy position, making sure to pad all pertinent neurovascular pressure points. The genitals were prepped and draped in the usual sterile fashion.  A rigid cystoscope was advanced through the urethra and  into the bladder. The bladder was drained and then fully distended. A 30 and 70 lens were used to visualize the entire surface of the bladder and a systematic fashion. There was noted to be a medium-size bladder tumor larger than 2 cm but less than 5 cm involving the left lateral bladder wall. There was a separate tumor on the posterior floor of the bladder, a tumor overlying the left her with this, and a tumor around the right ureter that was sessile in nature.  The visual obturator to the gyrus dissector scope was advanced to the urethra the cystoscope was removed and resection was carried out and normal saline. First I resected the bladder tumor on the posterior bladder floor. The chips were sent for one pathologic specimen and the resection site was cauterized to maintain good hemostasis. Attention was turned to the left lateral bladder wall. This tumor was resected down into muscle was visible. These tumor chips were sent for pathology. The resection site was then cauterized to maintain good hemostasis.  Attention was turned left ureter orifice. It was grossly covered with tumor and this tumor was resected. This specimen a chips were sent as a separate specimen. The tumor site was fulgurated to maintain good hemostasis making sure to avoid fulguration around the left ureter orifice.  Resection of tumor from around the right ureteral orifice which was sessile in nature was also resected avoiding the right ureter orifice. This was sent as a fourth specimen. This site was cauterized to maintain good hemostasis insured to avoid the right ureter orifice.  I obtained a right retrograde pyelogram by cannulating the right ureter orifice with a 5 [place] ureter catheter and injecting contrast. There were no filling defects in the renal collecting system or ureter  and there was no hydronephrosis. This side drained out well. Obtain a left retropyelogram by cannulating the left ureteral orifice with a 5 [place]  ureter catheter and injecting contrast. There was no filling defect in the renal collecting system or ureter and there were no hydronephrosis. This side drained out well.  Because of gross involvement of the left ureter orifice I elected to proceed with a left ureteroscopy. A sensor wire was placed through the left ureter orifice and into the left renal pelvis on fluoroscopy. I couldn't place a semirigid ureteroscope but this was unsuccessful and therefore I placed the obturator to-14 French ureter access sheath over the wire under fluoroscopy to dilate the distal orifice. This was successful and the safety wire remained in place. I was able to place his ureter skipping the left distal ureter and visualize up to approximately the level of the iliac vessels. There were no tumors.  I did not see any gross perforation, but I did resect deep into the bladder and therefore elected to proceed with a cystogram. I placed 10 cc of lidocaine jelly into the urethra. I then placed a 24 [place] hematuria coud-tip three-way catheter with 15 cc of sterile water into the balloon. I took a precontrast image which showed the stent in correct position as well as no contrast overlying the bladder. I then filled the bladder by gravity with Cystografin mixed with normal saline. His bladder would only go up 220 cc by gravity. A second film was taken which showed good distention of the bladder without extravasation. Next the bladder was drained and a third film was taken which showed no residual contrast indicating no perforation or extravasation.  This completed the procedure. I then performed a rectal examination under the anesthetic. His prostate was <10 cc and had no nodules. I then placed a belladonna and opium suppository to the rectum. Anesthesia was reversed and she was placed into the supine position, he was taken to the PACU in stable condition.  All counts were correct at the end of the case.  The patient and his wife  were instructed to restart the Lovenox injections and Coumadin tomorrow.

## 2012-11-03 NOTE — Anesthesia Postprocedure Evaluation (Signed)
  Anesthesia Post-op Note  Patient: Frederick [name]  Procedure(s) Performed: Procedure(s) (LRB): TRANSURETHRAL RESECTION OF BLADDER TUMOR WITH GYRUS (TURBT-GYRUS) (N/A) CYSTOSCOPY/RETROGRADE/ LEFT URETEROSCOPY AND STENT PLACEMENT CYSTOGRAM (N/A)  Patient Location: PACU  Anesthesia Type: General  Level of Consciousness: awake and alert   Airway and Oxygen Therapy: Patient Spontanous Breathing  Post-op Pain: mild  Post-op Assessment: Post-op Vital signs reviewed, Patient's Cardiovascular Status Stable, Respiratory Function Stable, Patent Airway and No signs of Nausea or vomiting  Last Vitals:  Filed Vitals:   11/03/12 1545  BP: 162/79  Pulse: 86  Temp: 36.7 C  Resp: 16    Post-op Vital Signs: stable   Complications: No apparent anesthesia complications

## 2012-11-04 ENCOUNTER — Encounter (HOSPITAL_COMMUNITY): Payer: Self-pay | Admitting: Urology

## 2012-11-15 ENCOUNTER — Inpatient Hospital Stay (HOSPITAL_COMMUNITY): Payer: Medicare Other

## 2012-11-15 ENCOUNTER — Emergency Department (HOSPITAL_COMMUNITY): Payer: Medicare Other

## 2012-11-15 ENCOUNTER — Inpatient Hospital Stay (HOSPITAL_COMMUNITY)
Admission: EM | Admit: 2012-11-15 | Discharge: 2012-11-20 | DRG: 872 | Disposition: A | Discharge status: Home Health | Payer: Medicare Other | Attending: Internal Medicine | Admitting: Internal Medicine

## 2012-11-15 ENCOUNTER — Encounter (HOSPITAL_COMMUNITY): Payer: Self-pay

## 2012-11-15 DIAGNOSIS — Z7901 Long term (current) use of anticoagulants: Secondary | ICD-10-CM

## 2012-11-15 DIAGNOSIS — D72829 Elevated white blood cell count, unspecified: Secondary | ICD-10-CM | POA: Diagnosis present

## 2012-11-15 DIAGNOSIS — I4891 Unspecified atrial fibrillation: Secondary | ICD-10-CM | POA: Diagnosis present

## 2012-11-15 DIAGNOSIS — I2782 Chronic pulmonary embolism: Secondary | ICD-10-CM | POA: Diagnosis present

## 2012-11-15 DIAGNOSIS — Z79899 Other long term (current) drug therapy: Secondary | ICD-10-CM

## 2012-11-15 DIAGNOSIS — J9610 Chronic respiratory failure, unspecified whether with hypoxia or hypercapnia: Secondary | ICD-10-CM | POA: Diagnosis present

## 2012-11-15 DIAGNOSIS — E876 Hypokalemia: Secondary | ICD-10-CM | POA: Diagnosis present

## 2012-11-15 DIAGNOSIS — E119 Type 2 diabetes mellitus without complications: Secondary | ICD-10-CM | POA: Diagnosis present

## 2012-11-15 DIAGNOSIS — N390 Urinary tract infection, site not specified: Secondary | ICD-10-CM | POA: Diagnosis present

## 2012-11-15 DIAGNOSIS — F329 Major depressive disorder, single episode, unspecified: Secondary | ICD-10-CM | POA: Diagnosis present

## 2012-11-15 DIAGNOSIS — Z794 Long term (current) use of insulin: Secondary | ICD-10-CM

## 2012-11-15 DIAGNOSIS — L8990 Pressure ulcer of unspecified site, unspecified stage: Secondary | ICD-10-CM | POA: Diagnosis present

## 2012-11-15 DIAGNOSIS — I10 Essential (primary) hypertension: Secondary | ICD-10-CM | POA: Diagnosis present

## 2012-11-15 DIAGNOSIS — E7800 Pure hypercholesterolemia, unspecified: Secondary | ICD-10-CM | POA: Diagnosis present

## 2012-11-15 DIAGNOSIS — A4152 Sepsis due to Pseudomonas: Principal | ICD-10-CM | POA: Diagnosis present

## 2012-11-15 DIAGNOSIS — I739 Peripheral vascular disease, unspecified: Secondary | ICD-10-CM | POA: Diagnosis present

## 2012-11-15 DIAGNOSIS — C649 Malignant neoplasm of unspecified kidney, except renal pelvis: Secondary | ICD-10-CM | POA: Diagnosis present

## 2012-11-15 DIAGNOSIS — C679 Malignant neoplasm of bladder, unspecified: Secondary | ICD-10-CM

## 2012-11-15 DIAGNOSIS — N179 Acute kidney failure, unspecified: Secondary | ICD-10-CM | POA: Diagnosis present

## 2012-11-15 DIAGNOSIS — R5381 Other malaise: Secondary | ICD-10-CM

## 2012-11-15 DIAGNOSIS — I2510 Atherosclerotic heart disease of native coronary artery without angina pectoris: Secondary | ICD-10-CM | POA: Diagnosis present

## 2012-11-15 DIAGNOSIS — F3289 Other specified depressive episodes: Secondary | ICD-10-CM | POA: Diagnosis present

## 2012-11-15 DIAGNOSIS — E1169 Type 2 diabetes mellitus with other specified complication: Secondary | ICD-10-CM | POA: Diagnosis present

## 2012-11-15 DIAGNOSIS — A419 Sepsis, unspecified organism: Secondary | ICD-10-CM | POA: Diagnosis present

## 2012-11-15 DIAGNOSIS — K219 Gastro-esophageal reflux disease without esophagitis: Secondary | ICD-10-CM | POA: Diagnosis present

## 2012-11-15 DIAGNOSIS — I2699 Other pulmonary embolism without acute cor pulmonale: Secondary | ICD-10-CM

## 2012-11-15 DIAGNOSIS — L8992 Pressure ulcer of unspecified site, stage 2: Secondary | ICD-10-CM | POA: Diagnosis present

## 2012-11-15 LAB — CBC WITH DIFFERENTIAL/PLATELET
Eosinophils Relative: 0 % (ref 0–5)
HCT: 37.7 % — ABNORMAL LOW (ref 39.0–52.0)
Lymphs Abs: 1.5 10*3/uL (ref 0.7–4.0)
MCH: 31.2 pg (ref 26.0–34.0)
MCV: 99 fL (ref 78.0–100.0)
Monocytes Absolute: 1.5 10*3/uL — ABNORMAL HIGH (ref 0.1–1.0)
Neutro Abs: 27.6 10*3/uL — ABNORMAL HIGH (ref 1.7–7.7)
Platelets: 342 10*3/uL (ref 150–400)
RDW: 13.6 % (ref 11.5–15.5)

## 2012-11-15 LAB — URINALYSIS, ROUTINE W REFLEX MICROSCOPIC
Nitrite: POSITIVE — AB
Protein, ur: 100 mg/dL — AB
Specific Gravity, Urine: 1.017 (ref 1.005–1.030)
Urobilinogen, UA: 1 mg/dL (ref 0.0–1.0)

## 2012-11-15 LAB — URINE MICROSCOPIC-ADD ON

## 2012-11-15 LAB — COMPREHENSIVE METABOLIC PANEL
AST: 23 U/L (ref 0–37)
BUN: 36 mg/dL — ABNORMAL HIGH (ref 6–23)
CO2: 42 mEq/L (ref 19–32)
Calcium: 10 mg/dL (ref 8.4–10.5)
Chloride: 89 mEq/L — ABNORMAL LOW (ref 96–112)
Creatinine, Ser: 1.29 mg/dL (ref 0.50–1.35)
GFR calc Af Amer: 58 mL/min — ABNORMAL LOW (ref >90)
GFR calc non Af Amer: 50 mL/min — ABNORMAL LOW (ref >90)
Total Bilirubin: 0.3 mg/dL (ref 0.3–1.2)

## 2012-11-15 LAB — PROTIME-INR
INR: 1.61 — ABNORMAL HIGH (ref 0.00–1.49)
Prothrombin Time: 18.7 s — ABNORMAL HIGH (ref 11.6–15.2)

## 2012-11-15 LAB — POCT I-STAT TROPONIN I: Troponin i, poc: 0 ng/mL (ref 0.00–0.08)

## 2012-11-15 LAB — CG4 I-STAT (LACTIC ACID): Lactic Acid, Venous: 3.12 mmol/L — ABNORMAL HIGH (ref 0.5–2.2)

## 2012-11-15 LAB — PRO B NATRIURETIC PEPTIDE: Pro B Natriuretic peptide (BNP): 663.1 pg/mL — ABNORMAL HIGH (ref 0–450)

## 2012-11-15 MED ORDER — WARFARIN - PHARMACIST DOSING INPATIENT
Freq: Every day | Status: DC
  Administered 2012-11-16: 18:00:00

## 2012-11-15 MED ORDER — DEXTROSE 5 % IV SOLN
1.0000 g | INTRAVENOUS | Status: DC
  Administered 2012-11-15: 1 g via INTRAVENOUS
  Filled 2012-11-15: qty 10

## 2012-11-15 MED ORDER — VANCOMYCIN HCL 10 G IV SOLR
1500.0000 mg | Freq: Once | INTRAVENOUS | Status: AC
  Administered 2012-11-16: 1500 mg via INTRAVENOUS
  Filled 2012-11-15: qty 1500

## 2012-11-15 MED ORDER — INSULIN ASPART 100 UNIT/ML ~~LOC~~ SOLN: 0.0000 [IU] | Freq: Three times a day (TID) | SUBCUTANEOUS | Status: DC

## 2012-11-15 MED ORDER — ENOXAPARIN SODIUM 40 MG/0.4ML ~~LOC~~ SOLN
40.0000 mg | Freq: Every day | SUBCUTANEOUS | Status: DC
  Administered 2012-11-16: 40 mg via SUBCUTANEOUS
  Filled 2012-11-15 (×2): qty 0.4

## 2012-11-15 MED ORDER — ACETAMINOPHEN 325 MG PO TABS
650.0000 mg | ORAL_TABLET | Freq: Once | ORAL | Status: AC
  Administered 2012-11-15: 650 mg via ORAL
  Filled 2012-11-15: qty 2
  Filled 2012-11-15: qty 1

## 2012-11-15 MED ORDER — SODIUM CHLORIDE 0.9 % IV SOLN
INTRAVENOUS | Status: DC
  Administered 2012-11-16: 1000 mL via INTRAVENOUS

## 2012-11-15 MED ORDER — SODIUM CHLORIDE 0.9 % IV SOLN
INTRAVENOUS | Status: DC
  Administered 2012-11-15: 21:00:00 via INTRAVENOUS

## 2012-11-15 MED ORDER — DEXTROSE 5 % IV SOLN
1.0000 g | Freq: Two times a day (BID) | INTRAVENOUS | Status: DC
  Administered 2012-11-16 – 2012-11-17 (×5): 1 g via INTRAVENOUS
  Filled 2012-11-15 (×6): qty 1

## 2012-11-15 NOTE — ED Provider Notes (Addendum)
CSN: [id number]     Arrival date & time 11/15/12  1747 History     First MD Initiated Contact with Patient 11/15/12 2011     Chief Complaint  Patient presents with  . Fever  . Altered Mental Status   (Consider location/radiation/quality/duration/timing/severity/associated sxs/prior Treatment) Patient is a 79 y.o. male presenting with fever and altered mental status. The history is provided by the patient and the spouse.  Fever Altered Mental Status Associated symptoms: fever    patient complains of fever and altered mental status times one day. Recent bladder surgery and concern that this may be from his urine. No recent cough or congestion. No vomiting or diarrhea. Patient given antipyretics and has improved. He does have some dyspnea. No rashes noted. Urine has been cloudy and bloody. Does have indwelling Foley catheter. No other treatment used prior to arrival and symptoms have been gradually worse  Past Medical History  Diagnosis Date  . Diabetes mellitus   . Hypertension   . Hypercholesterolemia   . Esophageal stricture   . Pulmonary embolism 2008    chronic coumadin   . Coronary artery disease     DR. BERRY IS PT'S CARDIOLOGIST  . Atrial fibrillation     CHRONIC COUMADIN  . Anxiety     SINCE 1975   . Depression     SINCE 1975    . Shortness of breath     NOT SURE WHAT CAUSES SOB - BUT HE IS NOT ABLE TO BE ACTIVE - AND IS SOB WITH ANY ACTIVITY - USES OXYGEN 2 L NASAL CANNULA - ALL THE TIME -EXCEPT WHEN IN SHOWER OR CHANGING CLOTHES  . Peripheral vascular disease     TOLD SOME SMALL AMOUNT OF BLOCKAGE IN LEG WHEN HEART CATH DONE 2008  . Bladder tumor     HAS HAD HEMATURIA OFF AND ON - HAS FOLEY CATH THAT WAS PLACED JUNE 20TH, 2014  . GERD (gastroesophageal reflux disease)   . Edema     FEET AND LEGS MOST DAYS - SOMETIMES WEARS COMPRESSION HOSE  . Cancer     SKIN CANCERS  . Arthritis     S/P BILATERAL TOTAL KNEE REPLACEMENTS - BUT BOTH KNEES PAINFUL AND JOINTS WORN  OUT; BAD RIGHT HIP - BUT NOT A CANDIDATE FOR HIP PREPLACMENT;  HAS SEVERE LOWER  BACK AND LEG PAIN - HAS SPINAL STENOSIS AND BULGING DISC; CURVATURE OF UPPER SPINE - UNABLE TO LAY HIS HEAD FLAT.  Marland Kitchen Pneumonia 2005  . Difficult intubation     DUE TO LIMITED NECK FLEXION AND SHORT NECK--ANESTHESIA RECORD FROM 2007 VATS SURGERY AT [facility] OBTAINED AND ON PT'S CHART.  Marland Kitchen Problems with hearing     WEARS BILATERAL HEARING AIDS   Past Surgical History  Procedure Laterality Date  . Shoulder surgery      bilateral  . Pancreas surgery  2001    TUMOR REMOVED FROM PANCREAS AND SPLEEN ALSO REMOVED  . Total knee arthroplasty      bilateral  . Lung surgery  2007    non-cancer  VIDEO ASSISTED THORCOTOMY TO REMOVE FLUID / DECORTICATION. DR. Edwyna Shell  . Basil cell    . Cardiac catheterization  2008  . Esophageal stretch    . Skin cancer removed from left side of head - required skin graft from left leg    . Cartilage repair 198- left knee    . Joint replacement    . Transurethral resection of bladder tumor with gyrus (turbt-gyrus) N/A 11/03/2012  Procedure: TRANSURETHRAL RESECTION OF BLADDER TUMOR WITH GYRUS (TURBT-GYRUS);  Surgeon: Milford Cage, MD;  Location: [facility];  Service: Urology;  Laterality: N/A;  . Cystoscopy/retrograde/ureteroscopy  11/03/2012    Procedure: CYSTOSCOPY/RETROGRADE/ LEFT URETEROSCOPY AND STENT PLACEMENT;  Surgeon: Milford Cage, MD;  Location: [facility];  Service: Urology;;  . Theador Hawthorne N/A 11/03/2012    Procedure: CYSTOGRAM;  Surgeon: Milford Cage, MD;  Location: [facility];  Service: Urology;  Laterality: N/A;   Family History  Problem Relation Age of Onset  . Heart disease Mother   . Melanoma Father    History  Substance Use Topics  . Smoking status: Never Smoker   . Smokeless tobacco: Never Used  . Alcohol Use: No    Review of Systems  Constitutional: Positive for fever.  Psychiatric/Behavioral: Positive for altered mental status.  All other systems  reviewed and are negative.    Allergies  Hydromorphone hcl; Morphine; and Pregabalin  Home Medications   Current Outpatient Rx  Name  Route  Sig  Dispense  Refill  . atorvastatin (LIPITOR) 40 MG tablet   Oral   Take 40 mg by mouth every evening.           . celecoxib (CELEBREX) 100 MG capsule   Oral   Take 100 mg by mouth 2 (two) times daily.           . chlordiazePOXIDE (LIBRIUM) 10 MG capsule   Oral   Take 10 mg by mouth 3 (three) times daily.           . DULoxetine (CYMBALTA) 60 MG capsule   Oral   Take 60 mg by mouth at bedtime.           . enoxaparin (LOVENOX) 40 MG/0.4ML injection   Subcutaneous   Inject 40 mg into the skin daily. Stops coumadin for surgery - last day to take coumadin is 10/28/12. Starts lovenox bridge once a day in the am  -first dose is 10/29/12 --last dose is 11/02/12--no lovenox the day of surgery.  Told to resume lovenox and coumadin both - the day after surgery.  Pt to go to [facility] coumadin clinic on Monday 8/4 for pt, inr check.         . furosemide (LASIX) 40 MG tablet   Oral   Take 40-80 mg by mouth 2 (two) times daily. Takes 80 mg in the morning and 40 mg in the evening.          Marland Kitchen glipiZIDE (GLUCOTROL) 10 MG tablet   Oral   Take 10 mg by mouth daily.          Marland Kitchen HYDROcodone-acetaminophen (NORCO/VICODIN) 5-325 MG per tablet   Oral   Take 1-2 tablets by mouth every 4 (four) hours as needed for pain.   30 tablet   0   . hyoscyamine (LEVSIN, ANASPAZ) 0.125 MG tablet   Oral   Take 1 tablet (0.125 mg total) by mouth every 4 (four) hours as needed for cramping (bladder spasms).   40 tablet   4   . insulin glargine (LANTUS) 100 UNIT/ML injection   Subcutaneous   Inject 46 Units into the skin daily. Daily with breakfast         . insulin lispro (HUMALOG) 100 UNIT/ML injection   Subcutaneous   Inject 2-4 Units into the skin daily before breakfast. Sliding scale         . irbesartan (AVAPRO) 150 MG tablet    Oral  Take 150 mg by mouth daily with breakfast.         . isosorbide mononitrate (IMDUR) 60 MG 24 hr tablet   Oral   Take 60 mg by mouth daily with supper.          . lansoprazole (PREVACID) 30 MG capsule   Oral   Take 1 capsule (30 mg total) by mouth daily.   90 capsule   4   . liver oil-zinc oxide (DESITIN) 40 % ointment   Topical   Apply 1 application topically daily as needed for dry skin. Apply to bottom         . metolazone (ZAROXOLYN) 5 MG tablet   Oral   Take 7.5 mg by mouth daily.           . phenazopyridine (PYRIDIUM) 100 MG tablet   Oral   Take 1 tablet (100 mg total) by mouth every 8 (eight) hours as needed for pain (Burning urination.  Will turn urine and body fluids orange.).   30 tablet   1   . potassium chloride SA (K-DUR,KLOR-CON) 20 MEQ tablet   Oral   Take 20 mEq by mouth 2 (two) times daily.           Marland Kitchen senna-docusate (SENOKOT S) 8.6-50 MG per tablet   Oral   Take 1 tablet by mouth 2 (two) times daily.   60 tablet   0   . warfarin (COUMADIN) 5 MG tablet   Oral   Take 5 mg by mouth daily.          . cephALEXin (KEFLEX) 500 MG capsule   Oral   Take 1 capsule (500 mg total) by mouth 3 (three) times daily. Take for 3 days following surgery. Begin taking again the day before you return for clinic.   18 capsule   0    BP 129/73  Pulse 113  Temp(Src) 101.8 F (38.8 C) (Oral)  Resp 20  SpO2 91% Physical Exam  Nursing note and vitals reviewed. Constitutional: He is oriented to person, place, and time. He appears well-developed and well-nourished.  Non-toxic appearance. No distress.  HENT:  Head: Normocephalic and atraumatic.  Eyes: Conjunctivae, EOM and lids are normal. Pupils are equal, round, and reactive to light.  Neck: Normal range of motion. Neck supple. No tracheal deviation present. No mass present.  Cardiovascular: Regular rhythm and normal heart sounds.  Tachycardia present.  Exam reveals no gallop.   No murmur  heard. Pulmonary/Chest: Effort normal. No stridor. No respiratory distress. He has decreased breath sounds. He has no wheezes. He has no rhonchi. He has no rales.  Abdominal: Soft. Normal appearance and bowel sounds are normal. He exhibits no distension. There is no tenderness. There is no rebound and no CVA tenderness.  Musculoskeletal: Normal range of motion. He exhibits no edema and no tenderness.  Neurological: He is alert and oriented to person, place, and time. He has normal strength. No cranial nerve deficit or sensory deficit. GCS eye subscore is 4. GCS verbal subscore is 5. GCS motor subscore is 6.  Skin: Skin is warm and dry. No abrasion and no rash noted.  Psychiatric: His affect is blunt. His speech is delayed. He is slowed.    ED Course   Procedures (including critical care time)  Labs Reviewed  CBC WITH DIFFERENTIAL - Abnormal; Notable for the following:    WBC 30.6 (*)    RBC 3.81 (*)    Hemoglobin 11.9 (*)  HCT 37.7 (*)    Neutrophils Relative % 90 (*)    Lymphocytes Relative 5 (*)    Neutro Abs 27.6 (*)    Monocytes Absolute 1.5 (*)    All other components within normal limits  COMPREHENSIVE METABOLIC PANEL - Abnormal; Notable for the following:    Potassium 3.3 (*)    Chloride 89 (*)    CO2 42 (*)    Glucose, Bld 67 (*)    BUN 36 (*)    Albumin 3.4 (*)    GFR calc non Af Amer 50 (*)    GFR calc Af Amer 58 (*)    All other components within normal limits  CULTURE, BLOOD (ROUTINE X 2)  CULTURE, BLOOD (ROUTINE X 2)  URINE CULTURE  URINALYSIS, ROUTINE W REFLEX MICROSCOPIC  PROTIME-INR   No results found. No diagnosis found.  MDM  Pt given iv fluids and started on rocephin. Pt given oral food for mild hypoglycemia. Blood cx obtain and pt to be admitted   Date: 11/15/2012  Rate: 104  Rhythm: atrial fibrillation  QRS Axis: normal  Intervals: normal  ST/T Wave abnormalities: nonspecific ST changes  Conduction Disutrbances:none  Narrative  Interpretation:   Old EKG Reviewed: unchanged   Toy Baker, MD 11/15/12 2252  Toy Baker, MD 11/15/12 [phone]

## 2012-11-15 NOTE — ED Notes (Signed)
Per wife, pt has had fever/shaking/chills/confused, called pts PCP they sent him here, pt had tumors removed from his bladder on 7/30, states ?UTI

## 2012-11-15 NOTE — H&P (Addendum)
[facility] History and Physical  Frederick [name] YNW:[id number] DOB: [birth year] DOA: 11/15/2012  Referring physician: Bruce Donath PCP: Georgianne Fick, MD   Chief Complaint:  Fever with chills and confusion x 1 day   History provided by wife at bedside as patient is very hard of hearing  HPI:  79 year old obese  male with a history of diabetes mellitus on insulin, hypertension, coronary artery disease, A. fib and history of PE on chronic Coumadin, GERD, history of peripheral vascular disease, depression, hard of hearing, history of pancreatic tumor status post resection and his recent transurethral bladder tumor resection and left ureteral stent placed by Dr Margarita Grizzle on 11/03/2012 and discharged home with a foley was brought to the ED by his wife after she noticed him to have sudden onset chills and shaking this off and on. She quoted his temperature and was about 100F. Patient also complained of some infraumbilical pain and appeared confused. Patient's wife called Dr Hilario Quarry office and was instructed to work with the ED. She denied any nausea, vomiting, chest pain, palpitations, worsened shortness of breath, diarrhea, hematuria. At baseline the patient has shortness of breath with exertion and is on 2 L oxygen via nasal cannula at home. He is wheelchair bound due to rt hip fracture and limited mobilityand has off-and-on leg edema.  Course in the ED. Patient was noted to be in sepsis with fever of 101.6F, tachycardic to 113 and blood pressure of 94/43 mmHg. Blood flow done showed a white count of 30.6 k, mildly hypokalemic, hypokalemic, and metabolic alkalosis with CO2 of 42. BUN was 36 with creatinine of 1.29 and INR of 1.61. Blood glucose was low at 67 . A urinalysis done was highly suggestive all the UTI. Blood culture sent from the ED and patient given a bolus of IV Rocephin. Foley catheter was not changed. A chest x-ray done adjusted mild interstitial pulmonary edema.  Review  of Systems:  Constitutional: Positive for fever, chills, diaphoresis, denies  appetite change and fatigue.  HEENT: Denies photophobia, eye pain, redness,  ear pain, congestion, sore throat, rhinorrhea, sneezing, mouth sores, trouble swallowing, neck pain, neck stiffness and tinnitus.   chronic hearing loss, Respiratory: Denies SOB, DOE, cough, chest tightness,  and wheezing.   Cardiovascular: Denies chest pain, palpitations . Has off-and-on leg swelling.  Gastrointestinal: Abdominal pain, Denies nausea, vomiting, diarrhea, constipation, blood in stool and abdominal distention.  Genitourinary: Denies dysuria, urgency, frequency, hematuria, flank pain .  Musculoskeletal: Denies myalgias, back pain, joint swelling, reports chronic arthralgias . Skin: Denies pallor, rash and wound.  Neurological: Denies dizziness, seizures, syncope, weakness, light-headedness, numbness and headaches.  Hematological: Denies adenopathy. Has Easy bruising Psychiatric/Behavioral: Denies mood changes, nervousness, sleep disturbance and agitation   Past Medical History  Diagnosis Date  . Diabetes mellitus   . Hypertension   . Hypercholesterolemia   . Esophageal stricture   . Pulmonary embolism 2008    chronic coumadin   . Coronary artery disease     DR. BERRY IS PT'S CARDIOLOGIST  . Atrial fibrillation     CHRONIC COUMADIN  . Anxiety     SINCE 1975   . Depression     SINCE 1975    . Shortness of breath     NOT SURE WHAT CAUSES SOB - BUT HE IS NOT ABLE TO BE ACTIVE - AND IS SOB WITH ANY ACTIVITY - USES OXYGEN 2 L NASAL CANNULA - ALL THE TIME -EXCEPT WHEN IN SHOWER OR CHANGING CLOTHES  . Peripheral vascular disease  TOLD SOME SMALL AMOUNT OF BLOCKAGE IN LEG WHEN HEART CATH DONE 2008  . Bladder tumor     HAS HAD HEMATURIA OFF AND ON - HAS FOLEY CATH THAT WAS PLACED JUNE 20TH, 2014  . GERD (gastroesophageal reflux disease)   . Edema     FEET AND LEGS MOST DAYS - SOMETIMES WEARS COMPRESSION HOSE  . Cancer      SKIN CANCERS  . Arthritis     S/P BILATERAL TOTAL KNEE REPLACEMENTS - BUT BOTH KNEES PAINFUL AND JOINTS WORN OUT; BAD RIGHT HIP - BUT NOT A CANDIDATE FOR HIP PREPLACMENT;  HAS SEVERE LOWER  BACK AND LEG PAIN - HAS SPINAL STENOSIS AND BULGING DISC; CURVATURE OF UPPER SPINE - UNABLE TO LAY HIS HEAD FLAT.  Marland Kitchen Pneumonia 2005  . Difficult intubation     DUE TO LIMITED NECK FLEXION AND SHORT NECK--ANESTHESIA RECORD FROM 2007 VATS SURGERY AT [facility] OBTAINED AND ON PT'S CHART.  Marland Kitchen Problems with hearing     WEARS BILATERAL HEARING AIDS   Past Surgical History  Procedure Laterality Date  . Shoulder surgery      bilateral  . Pancreas surgery  2001    TUMOR REMOVED FROM PANCREAS AND SPLEEN ALSO REMOVED  . Total knee arthroplasty      bilateral  . Lung surgery  2007    non-cancer  VIDEO ASSISTED THORCOTOMY TO REMOVE FLUID / DECORTICATION. DR. Edwyna Shell  . Basil cell    . Cardiac catheterization  2008  . Esophageal stretch    . Skin cancer removed from left side of head - required skin graft from left leg    . Cartilage repair 198- left knee    . Joint replacement    . Transurethral resection of bladder tumor with gyrus (turbt-gyrus) N/A 11/03/2012    Procedure: TRANSURETHRAL RESECTION OF BLADDER TUMOR WITH GYRUS (TURBT-GYRUS);  Surgeon: Milford Cage, MD;  Location: [facility];  Service: Urology;  Laterality: N/A;  . Cystoscopy/retrograde/ureteroscopy  11/03/2012    Procedure: CYSTOSCOPY/RETROGRADE/ LEFT URETEROSCOPY AND STENT PLACEMENT;  Surgeon: Milford Cage, MD;  Location: [facility];  Service: Urology;;  . Theador Hawthorne N/A 11/03/2012    Procedure: CYSTOGRAM;  Surgeon: Milford Cage, MD;  Location: [facility];  Service: Urology;  Laterality: N/A;   Social History:  reports that he has never smoked. He has never used smokeless tobacco. He reports that he does not drink alcohol or use illicit drugs.  Allergies  Allergen Reactions  . Hydromorphone Hcl Other (See Comments)    Dilaudid  caused Confusion   . Morphine Other (See Comments)    confusion  . Pregabalin Other (See Comments)    lyrica - caused hallucinations    Family History  Problem Relation Age of Onset  . Heart disease Mother   . Melanoma Father     Prior to Admission medications   Medication Sig Start Date End Date Taking? Authorizing Provider  atorvastatin (LIPITOR) 40 MG tablet Take 40 mg by mouth every evening.     Yes Historical Provider, MD  celecoxib (CELEBREX) 100 MG capsule Take 100 mg by mouth 2 (two) times daily.     Yes Historical Provider, MD  chlordiazePOXIDE (LIBRIUM) 10 MG capsule Take 10 mg by mouth 3 (three) times daily.     Yes Historical Provider, MD  DULoxetine (CYMBALTA) 60 MG capsule Take 60 mg by mouth at bedtime.     Yes Historical Provider, MD  enoxaparin (LOVENOX) 40 MG/0.4ML injection Inject 40 mg into the  skin daily. Stops coumadin for surgery - last day to take coumadin is 10/28/12. Starts lovenox bridge once a day in the am  -first dose is 10/29/12 --last dose is 11/02/12--no lovenox the day of surgery.  Told to resume lovenox and coumadin both - the day after surgery.  Pt to go to [facility] coumadin clinic on Monday 8/4 for pt, inr check.   Yes Historical Provider, MD  furosemide (LASIX) 40 MG tablet Take 40-80 mg by mouth 2 (two) times daily. Takes 80 mg in the morning and 40 mg in the evening.    Yes Historical Provider, MD  glipiZIDE (GLUCOTROL) 10 MG tablet Take 10 mg by mouth daily.    Yes Historical Provider, MD  HYDROcodone-acetaminophen (NORCO/VICODIN) 5-325 MG per tablet Take 1-2 tablets by mouth every 4 (four) hours as needed for pain. 11/03/12  Yes Milford Cage, MD  hyoscyamine (LEVSIN, ANASPAZ) 0.125 MG tablet Take 1 tablet (0.125 mg total) by mouth every 4 (four) hours as needed for cramping (bladder spasms). 11/03/12  Yes Milford Cage, MD  insulin glargine (LANTUS) 100 UNIT/ML injection Inject 46 Units into the skin daily. Daily with breakfast    Yes Historical Provider, MD  insulin lispro (HUMALOG) 100 UNIT/ML injection Inject 2-4 Units into the skin daily before breakfast. Sliding scale   Yes Historical Provider, MD  irbesartan (AVAPRO) 150 MG tablet Take 150 mg by mouth daily with breakfast.   Yes Historical Provider, MD  isosorbide mononitrate (IMDUR) 60 MG 24 hr tablet Take 60 mg by mouth daily with supper.    Yes Historical Provider, MD  lansoprazole (PREVACID) 30 MG capsule Take 1 capsule (30 mg total) by mouth daily. 02/14/11 11/15/12 Yes Louis Meckel, MD  liver oil-zinc oxide (DESITIN) 40 % ointment Apply 1 application topically daily as needed for dry skin. Apply to bottom   Yes Historical Provider, MD  metolazone (ZAROXOLYN) 5 MG tablet Take 7.5 mg by mouth daily.     Yes Historical Provider, MD  phenazopyridine (PYRIDIUM) 100 MG tablet Take 1 tablet (100 mg total) by mouth every 8 (eight) hours as needed for pain (Burning urination.  Will turn urine and body fluids orange.). 11/03/12  Yes Milford Cage, MD  potassium chloride SA (K-DUR,KLOR-CON) 20 MEQ tablet Take 20 mEq by mouth 2 (two) times daily.     Yes Historical Provider, MD  senna-docusate (SENOKOT S) 8.6-50 MG per tablet Take 1 tablet by mouth 2 (two) times daily. 11/03/12  Yes Milford Cage, MD  warfarin (COUMADIN) 5 MG tablet Take 5 mg by mouth daily.    Yes Historical Provider, MD  cephALEXin (KEFLEX) 500 MG capsule Take 1 capsule (500 mg total) by mouth 3 (three) times daily. Take for 3 days following surgery. Begin taking again the day before you return for clinic. 11/03/12   Milford Cage, MD    Physical Exam:  Filed Vitals:   11/15/12 1807 11/15/12 2313  BP: 129/73 94/43  Pulse: 113   Temp: 101.8 F (38.8 C)   TempSrc: Oral   Resp: 20   SpO2: 91% 95%    Constitutional: Vital signs reviewed.  Patient is an obese male lying in bed in no acute distress, hard of hearing. HEENT: scar over left temporal area from ? Skin Tumor removal,  no pallor, moist oral mucosa, no JVD Cardiovascular: S1 and S2 irregular, no MRG, pulses symmetric and intact bilaterally Pulmonary/Chest: CTAB, no wheezes, rales, or rhonchi Abdominal: Soft.  non-distended, mild  tenderness over her suprapubic area, bowel sounds are normal, no masses, organomegaly, or guarding present. foley in place, old horizontal laparotomy scar GU: left CVA tenderness Musculoskeletal: No joint deformities, erythema, or stiffness, ROM full and no nontender Ext: Trace edema bilaterally  pulses palpable bilaterally  Neurological: A&O x3, with a hard of hearing. No focal deficit Skin: Warm, dry and intact. No rash, cyanosis, or clubbing.   Labs on Admission:  Basic Metabolic Panel:  Recent Labs Lab 11/15/12 1926  NA 138  K 3.3*  CL 89*  CO2 42*  GLUCOSE 67*  BUN 36*  CREATININE 1.29  CALCIUM 10.0   Liver Function Tests:  Recent Labs Lab 11/15/12 1926  AST 23  ALT 12  ALKPHOS 94  BILITOT 0.3  PROT 7.6  ALBUMIN 3.4*   No results found for this basename: LIPASE, AMYLASE,  in the last 168 hours No results found for this basename: AMMONIA,  in the last 168 hours CBC:  Recent Labs Lab 11/15/12 1926  WBC 30.6*  NEUTROABS 27.6*  HGB 11.9*  HCT 37.7*  MCV 99.0  PLT 342   Cardiac Enzymes: No results found for this basename: CKTOTAL, CKMB, CKMBINDEX, TROPONINI,  in the last 168 hours BNP: No components found with this basename: POCBNP,  CBG: No results found for this basename: GLUCAP,  in the last 168 hours  Radiological Exams on Admission: Dg Chest 2 View  11/15/2012   *RADIOLOGY REPORT*  Clinical Data: Chest pain.  Shortness of breath.  Weakness.  CHEST - 2 VIEW  Comparison: Chest x-ray 10/12/2012.  Findings: Lung volumes are low.  Film is under penetrated, which limits the Diagnostic sensitivity and specificity of this examination.  With these limitations in mind, there are bibasilar opacities (left greater than right), favored to represent  subsegmental atelectasis.  No definite pleural effusions. Cephalization of the pulmonary vasculature, with evidence of very mild interstitial pulmonary edema.  Mild cardiomegaly. Retrocardiac density compatible with a moderate sized hiatal hernia. The patient is rotated to the left on today's exam, resulting in distortion of the mediastinal contours and reduced diagnostic sensitivity and specificity for mediastinal pathology. Atherosclerosis of the thoracic aorta. Multiple surgical clips are noted projecting over the upper abdomen.  There appears to be a left-sided double J ureteral stent in position, however this is incompletely visualized.  IMPRESSION: 1.  Findings, as above, concerning for early congestive heart failure. 2.  Atherosclerosis. 3.  Moderate hiatal hernia. 4.  Postoperative changes, as above.   Original Report Authenticated By: Trudie Reed, M.D.    EKG: A. fib at 100, no ST-T changes  Assessment/Plan  Principal Problem:   Sepsis Patient tachycardic, febrile and low normal blood pressure on exam with significant leukocytosis and elevated lactate . Admit to step down for closer monitoring. Likely in the setting of UTI given recent procedure with bladder tumor removed, lt ureteral stent and foley placed.  Patient given IV Rocephin in the ED . Will cover  Empirically with IV cefepime and vancomycin. Check CT abd and pelvis to r/o acute pyelonephritis. Given borderline worsening renal function will avoid IV contrast. Follow urine cx, blood cx. Recheck labs including lactic acid in the morning. i will place him on gentle hydration overnight.  Patient 's mental status seems at baseline but is very hard of hearing. Please consult Dr Margarita Grizzle in am. i will add him as the consultant. Monitor I/O.      Active Problems:    UTI (urinary tract infection) As outlined above.  A-fib Continue metoprolol. Continue Coumadin. Patient being admitted Lovenox since the recent procedure and  getting it at home. INR still subtherapeutic. Continue Lovenox for bridging.   Type 2 diabetes mellitus Patient on high does of Lantus and glipizide as well. Had low blood glucose in the ED as she had not eaten anything since this afternoon. I would his glipizide and place him on sliding scale insulin. Will order was Lantus in the morning. Check A1c  Hypertension Blood pressure low normal. Will hold Avapro and lasix for now.    RESPIRATORY FAILURE, CHRONIC Unclear etiology. ? OHS. Sees Dr Sherene Sires as outpt and is on 2 L o2 via .   has chronic leg edema as well and follows with Dr Allyson Sabal with [facility].  Pro BNP mildly elevated and CXR comments on early pulm vascular congestion. Will obtain a 2D echo.   Metabolic alkalosis  possibly contraction alkalosis with diuretics. Lasix for the time being. Continue Zaroxolyn. Recheck lab in the morning    GERD Daily PPI    Pulmonary embolism in 2008 On therapeutic anticoagulation  Diet: Diabetic  Code Status: Full code Family Communication: Wife and nephew at bedside Disposition Plan:currently  inpatient  Eddie North [facility] Pager [phone]  If 7PM-7AM, please contact night-coverage [email or web address] Password [facility] 11/15/2012, 11:19 PM   Total time spent on admission: 70 minutes

## 2012-11-15 NOTE — Progress Notes (Signed)
11/15/12 2315- Report received from ED RN. Pt to go CT and have labs before transfer.

## 2012-11-16 ENCOUNTER — Encounter (HOSPITAL_COMMUNITY): Payer: Self-pay | Admitting: Cardiology

## 2012-11-16 DIAGNOSIS — I10 Essential (primary) hypertension: Secondary | ICD-10-CM

## 2012-11-16 DIAGNOSIS — K219 Gastro-esophageal reflux disease without esophagitis: Secondary | ICD-10-CM

## 2012-11-16 DIAGNOSIS — I2510 Atherosclerotic heart disease of native coronary artery without angina pectoris: Secondary | ICD-10-CM | POA: Diagnosis present

## 2012-11-16 DIAGNOSIS — J9610 Chronic respiratory failure, unspecified whether with hypoxia or hypercapnia: Secondary | ICD-10-CM

## 2012-11-16 DIAGNOSIS — I2699 Other pulmonary embolism without acute cor pulmonale: Secondary | ICD-10-CM

## 2012-11-16 DIAGNOSIS — Z7901 Long term (current) use of anticoagulants: Secondary | ICD-10-CM

## 2012-11-16 DIAGNOSIS — I369 Nonrheumatic tricuspid valve disorder, unspecified: Secondary | ICD-10-CM

## 2012-11-16 DIAGNOSIS — I4891 Unspecified atrial fibrillation: Secondary | ICD-10-CM | POA: Diagnosis present

## 2012-11-16 LAB — CBC WITH DIFFERENTIAL/PLATELET
Basophils Absolute: 0 10*3/uL (ref 0.0–0.1)
Lymphocytes Relative: 4 % — ABNORMAL LOW (ref 12–46)
Lymphs Abs: 1.5 10*3/uL (ref 0.7–4.0)
MCV: 99.1 fL (ref 78.0–100.0)
Neutro Abs: 35.5 10*3/uL — ABNORMAL HIGH (ref 1.7–7.7)
Platelets: 298 10*3/uL (ref 150–400)
RBC: 3.34 MIL/uL — ABNORMAL LOW (ref 4.22–5.81)
RDW: 13.8 % (ref 11.5–15.5)
WBC: 38.1 10*3/uL — ABNORMAL HIGH (ref 4.0–10.5)

## 2012-11-16 LAB — GLUCOSE, CAPILLARY
Glucose-Capillary: 182 mg/dL — ABNORMAL HIGH (ref 70–99)
Glucose-Capillary: 92 mg/dL (ref 70–99)
Glucose-Capillary: 194 mg/dL — ABNORMAL HIGH (ref 70–99)

## 2012-11-16 LAB — BASIC METABOLIC PANEL
CO2: 39 mEq/L — ABNORMAL HIGH (ref 19–32)
Calcium: 8.6 mg/dL (ref 8.4–10.5)
Chloride: 93 mEq/L — ABNORMAL LOW (ref 96–112)
Creatinine, Ser: 1.43 mg/dL — ABNORMAL HIGH (ref 0.50–1.35)
GFR calc Af Amer: 51 mL/min — ABNORMAL LOW (ref >90)
Sodium: 136 mEq/L (ref 135–145)

## 2012-11-16 LAB — LACTIC ACID, PLASMA: Lactic Acid, Venous: 1.3 mmol/L (ref 0.5–2.2)

## 2012-11-16 LAB — MRSA PCR SCREENING: MRSA by PCR: POSITIVE — AB

## 2012-11-16 LAB — PROTIME-INR: INR: 1.63 — ABNORMAL HIGH (ref 0.00–1.49)

## 2012-11-16 MED ORDER — BIOTENE DRY MOUTH MT LIQD
15.0000 mL | Freq: Two times a day (BID) | OROMUCOSAL | Status: DC
  Administered 2012-11-16 – 2012-11-20 (×9): 15 mL via OROMUCOSAL

## 2012-11-16 MED ORDER — CHLORDIAZEPOXIDE HCL 5 MG PO CAPS
10.0000 mg | ORAL_CAPSULE | Freq: Three times a day (TID) | ORAL | Status: DC
  Administered 2012-11-16 – 2012-11-20 (×12): 10 mg via ORAL
  Filled 2012-11-16: qty 1
  Filled 2012-11-16: qty 2
  Filled 2012-11-16: qty 1
  Filled 2012-11-16: qty 2
  Filled 2012-11-16 (×2): qty 1
  Filled 2012-11-16: qty 2
  Filled 2012-11-16 (×2): qty 1
  Filled 2012-11-16 (×3): qty 2
  Filled 2012-11-16 (×2): qty 1

## 2012-11-16 MED ORDER — ONDANSETRON HCL 4 MG PO TABS: 4.0000 mg | ORAL_TABLET | Freq: Four times a day (QID) | ORAL | Status: DC | PRN

## 2012-11-16 MED ORDER — ONDANSETRON HCL 4 MG/2ML IJ SOLN: 4.0000 mg | Freq: Four times a day (QID) | INTRAMUSCULAR | Status: DC | PRN

## 2012-11-16 MED ORDER — SODIUM CHLORIDE 0.9 % IV SOLN
INTRAVENOUS | Status: AC
  Administered 2012-11-16: via INTRAVENOUS

## 2012-11-16 MED ORDER — HYOSCYAMINE SULFATE 0.125 MG PO TABS
0.1250 mg | ORAL_TABLET | ORAL | Status: DC | PRN
  Filled 2012-11-16: qty 1

## 2012-11-16 MED ORDER — WARFARIN SODIUM 7.5 MG PO TABS
7.5000 mg | ORAL_TABLET | Freq: Once | ORAL | Status: AC
  Administered 2012-11-16: 7.5 mg via ORAL
  Filled 2012-11-16: qty 1

## 2012-11-16 MED ORDER — VANCOMYCIN HCL 10 G IV SOLR
1500.0000 mg | INTRAVENOUS | Status: DC
  Administered 2012-11-16 – 2012-11-17 (×2): 1500 mg via INTRAVENOUS
  Filled 2012-11-16 (×3): qty 1500

## 2012-11-16 MED ORDER — SODIUM CHLORIDE 0.9 % IJ SOLN
3.0000 mL | Freq: Two times a day (BID) | INTRAMUSCULAR | Status: DC
  Administered 2012-11-16 – 2012-11-19 (×3): 3 mL via INTRAVENOUS

## 2012-11-16 MED ORDER — ACETAMINOPHEN 650 MG RE SUPP: 650.0000 mg | Freq: Four times a day (QID) | RECTAL | Status: DC | PRN

## 2012-11-16 MED ORDER — HYDROCODONE-ACETAMINOPHEN 5-325 MG PO TABS
1.0000 | ORAL_TABLET | ORAL | Status: DC | PRN
  Administered 2012-11-16: 2 via ORAL
  Administered 2012-11-16 (×3): 1 via ORAL
  Filled 2012-11-16 (×3): qty 1
  Filled 2012-11-16: qty 2

## 2012-11-16 MED ORDER — INSULIN ASPART 100 UNIT/ML ~~LOC~~ SOLN
0.0000 [IU] | Freq: Three times a day (TID) | SUBCUTANEOUS | Status: DC
  Administered 2012-11-16 – 2012-11-17 (×3): 3 [IU] via SUBCUTANEOUS
  Administered 2012-11-17: 5 [IU] via SUBCUTANEOUS
  Administered 2012-11-18: 17:00:00 via SUBCUTANEOUS
  Administered 2012-11-18: 8 [IU] via SUBCUTANEOUS
  Administered 2012-11-18 – 2012-11-19 (×2): 3 [IU] via SUBCUTANEOUS
  Administered 2012-11-19: 12:00:00 via SUBCUTANEOUS
  Administered 2012-11-19: 3 [IU] via SUBCUTANEOUS
  Administered 2012-11-20: 5 [IU] via SUBCUTANEOUS
  Administered 2012-11-20: 3 [IU] via SUBCUTANEOUS

## 2012-11-16 MED ORDER — CELECOXIB 100 MG PO CAPS
100.0000 mg | ORAL_CAPSULE | Freq: Two times a day (BID) | ORAL | Status: DC
  Administered 2012-11-16 (×2): 100 mg via ORAL
  Filled 2012-11-16 (×3): qty 1

## 2012-11-16 MED ORDER — INSULIN GLARGINE 100 UNIT/ML ~~LOC~~ SOLN
20.0000 [IU] | Freq: Every day | SUBCUTANEOUS | Status: DC
  Administered 2012-11-16 – 2012-11-20 (×5): 20 [IU] via SUBCUTANEOUS
  Filled 2012-11-16 (×6): qty 0.2

## 2012-11-16 MED ORDER — MUPIROCIN 2 % EX OINT
1.0000 "application " | TOPICAL_OINTMENT | Freq: Two times a day (BID) | CUTANEOUS | Status: AC
  Administered 2012-11-16 – 2012-11-20 (×10): 1 via NASAL
  Filled 2012-11-16: qty 22

## 2012-11-16 MED ORDER — PANTOPRAZOLE SODIUM 40 MG PO TBEC
40.0000 mg | DELAYED_RELEASE_TABLET | Freq: Every day | ORAL | Status: DC
  Administered 2012-11-16 – 2012-11-20 (×5): 40 mg via ORAL
  Filled 2012-11-16 (×5): qty 1

## 2012-11-16 MED ORDER — METOLAZONE 5 MG PO TABS
7.5000 mg | ORAL_TABLET | Freq: Every day | ORAL | Status: DC
  Administered 2012-11-16: 7.5 mg via ORAL
  Filled 2012-11-16 (×2): qty 1

## 2012-11-16 MED ORDER — ALBUTEROL SULFATE (5 MG/ML) 0.5% IN NEBU
2.5000 mg | INHALATION_SOLUTION | Freq: Four times a day (QID) | RESPIRATORY_TRACT | Status: DC | PRN
  Administered 2012-11-18: 2.5 mg via RESPIRATORY_TRACT
  Filled 2012-11-16: qty 0.5

## 2012-11-16 MED ORDER — WARFARIN SODIUM 5 MG PO TABS
5.0000 mg | ORAL_TABLET | Freq: Once | ORAL | Status: DC
  Filled 2012-11-16: qty 1

## 2012-11-16 MED ORDER — CHLORHEXIDINE GLUCONATE CLOTH 2 % EX PADS
6.0000 | MEDICATED_PAD | Freq: Every day | CUTANEOUS | Status: DC
  Administered 2012-11-16 – 2012-11-19 (×4): 6 via TOPICAL

## 2012-11-16 MED ORDER — POTASSIUM CHLORIDE CRYS ER 20 MEQ PO TBCR
20.0000 meq | EXTENDED_RELEASE_TABLET | Freq: Two times a day (BID) | ORAL | Status: DC
  Administered 2012-11-16 (×3): 20 meq via ORAL
  Filled 2012-11-16 (×5): qty 1

## 2012-11-16 MED ORDER — ISOSORBIDE MONONITRATE ER 60 MG PO TB24
60.0000 mg | ORAL_TABLET | Freq: Every day | ORAL | Status: DC
  Administered 2012-11-16: 60 mg via ORAL
  Filled 2012-11-16 (×2): qty 1

## 2012-11-16 MED ORDER — DULOXETINE HCL 60 MG PO CPEP
60.0000 mg | ORAL_CAPSULE | Freq: Every day | ORAL | Status: DC
  Administered 2012-11-16 – 2012-11-19 (×5): 60 mg via ORAL
  Filled 2012-11-16 (×6): qty 1

## 2012-11-16 MED ORDER — SENNOSIDES-DOCUSATE SODIUM 8.6-50 MG PO TABS
1.0000 | ORAL_TABLET | Freq: Two times a day (BID) | ORAL | Status: DC
  Administered 2012-11-16 – 2012-11-20 (×10): 1 via ORAL
  Filled 2012-11-16 (×11): qty 1

## 2012-11-16 MED ORDER — ATORVASTATIN CALCIUM 40 MG PO TABS
40.0000 mg | ORAL_TABLET | Freq: Every evening | ORAL | Status: DC
  Administered 2012-11-16 – 2012-11-19 (×4): 40 mg via ORAL
  Filled 2012-11-16 (×5): qty 1

## 2012-11-16 MED ORDER — ACETAMINOPHEN 325 MG PO TABS
650.0000 mg | ORAL_TABLET | Freq: Four times a day (QID) | ORAL | Status: DC | PRN
  Administered 2012-11-17: 650 mg via ORAL
  Filled 2012-11-16: qty 2

## 2012-11-16 MED ORDER — PHENAZOPYRIDINE HCL 100 MG PO TABS
100.0000 mg | ORAL_TABLET | Freq: Three times a day (TID) | ORAL | Status: DC | PRN
  Filled 2012-11-16: qty 1

## 2012-11-16 NOTE — Progress Notes (Signed)
Echocardiogram 2D Echocardiogram has been performed.  Frederick [name] 11/16/2012, 12:51 PM

## 2012-11-16 NOTE — Progress Notes (Signed)
PHARMACY - WARFARIN  Please see previous Pharmacy note for full details.  Confirmed warfarin dosage with [facility]. Patient has been stable on 5mg  daily prior to surgery, but INR has been slow to respond with re-initiation on 7/31. INR was 1.6 yesterday at the clinic, so patient was given a boosted dose of 7.5mg .  Confirmed that patient has also been on lovenox 40mg  sq q24h.  INR today remains subtherapeutic at 1.63. CBC stable, no bleeding reported.  Plan:  Will boost warfarin dose again to 7.5mg   Daily PT/INR  Loralee Pacas, PharmD, BCPS Pager: [phone] 11/16/2012 9:41 AM

## 2012-11-16 NOTE — Consult Note (Signed)
Reason for Consult:  Pt with sepsis and known CAD  Referring Physician: Dr. Gonzella [facility] UEA:[id number],GNFAO, MD Primary Cardiologist:Dr. Larey Days Frederick [name] is an 79 y.o. male.    Chief Complaint: fever and chills for 1 day -admitted 11/15/12   HPI: 79 year old obese male with a history of diabetes mellitus on insulin, hypertension, coronary artery disease, Permanent A. fib and history of PE on chronic Coumadin, GERD, history of peripheral vascular disease, depression, hard of hearing, history of pancreatic tumor status post resection and his recent transurethral bladder tumor resection and left ureteral stent placed by Dr Margarita Grizzle on 11/03/2012 and discharged home with a foley was brought to the ED by his wife after she noticed him to have sudden onset chills and shaking this off and on. She quoted his temperature that was about 100F. Patient also complained of some infraumbilical pain and appeared confused. Patient's wife called Dr Hilario Quarry office and was instructed to work with the ED.  She said he had not complained of any nausea, vomiting, chest pain, palpitations, worsened shortness of breath, diarrhea, hematuria. At baseline the patient has shortness of breath with exertion and is on 2 L oxygen via nasal cannula at home. He is wheelchair bound due to rt hip fracture and limited mobilityand has off-and-on leg edema.   In ER he had fever of 101.8, A fib with HR 113, BP 94/43. WBC 30.6k, mildly hypokalemic.  INR 1.61, glucose low at 67. CXR with mild interstitial pul. Edema.  EKG Atrial fib with RVR, no acute changes. Lactic acid 3.12 initially.  Pro BNP 663.  U/A positive.   Troponin I neg.  Blood cultures pending.  ABX started - today fever resolving. Pulse in the 90s.     Past Medical History  Diagnosis Date  . Diabetes mellitus   . Hypertension   . Hypercholesterolemia   . Esophageal stricture   . Pulmonary embolism 2008    chronic coumadin   . Coronary artery  disease     DR. BERRY IS PT'S CARDIOLOGIST  . Atrial fibrillation     CHRONIC COUMADIN-pemanent atrial fib  . Anxiety     SINCE 1975   . Depression     SINCE 1975    . Shortness of breath     NOT SURE WHAT CAUSES SOB - BUT HE IS NOT ABLE TO BE ACTIVE - AND IS SOB WITH ANY ACTIVITY - USES OXYGEN 2 L NASAL CANNULA - ALL THE TIME -EXCEPT WHEN IN SHOWER OR CHANGING CLOTHES on home 02  . Peripheral vascular disease     TOLD SOME SMALL AMOUNT OF BLOCKAGE IN LEG WHEN HEART CATH DONE 2008  . Bladder tumor     HAS HAD HEMATURIA OFF AND ON - HAS FOLEY CATH THAT WAS PLACED JUNE 20TH, 2014  . GERD (gastroesophageal reflux disease)   . Edema     FEET AND LEGS MOST DAYS - SOMETIMES WEARS COMPRESSION HOSE  . Cancer     SKIN CANCERS  . Arthritis     S/P BILATERAL TOTAL KNEE REPLACEMENTS - BUT BOTH KNEES PAINFUL AND JOINTS WORN OUT; BAD RIGHT HIP - BUT NOT A CANDIDATE FOR HIP PREPLACMENT;  HAS SEVERE LOWER  BACK AND LEG PAIN - HAS SPINAL STENOSIS AND BULGING DISC; CURVATURE OF UPPER SPINE - UNABLE TO LAY HIS HEAD FLAT.  Marland Kitchen Pneumonia 2005  . Difficult intubation     DUE TO LIMITED NECK FLEXION AND SHORT NECK--ANESTHESIA RECORD FROM 2007 VATS SURGERY AT  [facility] OBTAINED AND ON PT'S CHART.  Marland Kitchen Problems with hearing     WEARS BILATERAL HEARING AIDS    Past Surgical History  Procedure Laterality Date  . Shoulder surgery      bilateral  . Pancreas surgery  2001    TUMOR REMOVED FROM PANCREAS AND SPLEEN ALSO REMOVED  . Total knee arthroplasty      bilateral  . Lung surgery  2007    non-cancer  VIDEO ASSISTED THORCOTOMY TO REMOVE FLUID / DECORTICATION. DR. Edwyna Shell  . Basil cell    . Cardiac catheterization  2008    totally occluded nondominant LCX wth mod. ostial RCA disease and nl. EF  . Esophageal stretch    . Skin cancer removed from left side of head - required skin graft from left leg    . Cartilage repair 198- left knee    . Joint replacement    . Transurethral resection of bladder tumor with  gyrus (turbt-gyrus) N/A 11/03/2012    Procedure: TRANSURETHRAL RESECTION OF BLADDER TUMOR WITH GYRUS (TURBT-GYRUS);  Surgeon: Milford Cage, MD;  Location: [facility];  Service: Urology;  Laterality: N/A;  . Cystoscopy/retrograde/ureteroscopy  11/03/2012    Procedure: CYSTOSCOPY/RETROGRADE/ LEFT URETEROSCOPY AND STENT PLACEMENT;  Surgeon: Milford Cage, MD;  Location: [facility];  Service: Urology;;  . Theador Hawthorne N/A 11/03/2012    Procedure: CYSTOGRAM;  Surgeon: Milford Cage, MD;  Location: [facility];  Service: Urology;  Laterality: N/A;    Family History  Problem Relation Age of Onset  . Heart disease Mother   . Melanoma Father    Social History:  reports that he has never smoked. He has never used smokeless tobacco. He reports that he does not drink alcohol or use illicit drugs. Married, no children. Chronically wheelchair bound.   Allergies:  Allergies  Allergen Reactions  . Hydromorphone Hcl Other (See Comments)    Dilaudid caused Confusion   . Morphine Other (See Comments)    confusion  . Pregabalin Other (See Comments)    lyrica - caused hallucinations    Medications Prior to Admission  Medication Sig Dispense Refill  . atorvastatin (LIPITOR) 40 MG tablet Take 40 mg by mouth every evening.        . celecoxib (CELEBREX) 100 MG capsule Take 100 mg by mouth 2 (two) times daily.        . chlordiazePOXIDE (LIBRIUM) 10 MG capsule Take 10 mg by mouth 3 (three) times daily.        . DULoxetine (CYMBALTA) 60 MG capsule Take 60 mg by mouth at bedtime.        . enoxaparin (LOVENOX) 40 MG/0.4ML injection Inject 40 mg into the skin daily. Stops coumadin for surgery - last day to take coumadin is 10/28/12. Starts lovenox bridge once a day in the am  -first dose is 10/29/12 --last dose is 11/02/12--no lovenox the day of surgery.  Told to resume lovenox and coumadin both - the day after surgery.  Pt to [facility] coumadin clinic on Monday 8/4 for pt, inr check.      .  furosemide (LASIX) 40 MG tablet Take 40-80 mg by mouth 2 (two) times daily. Takes 80 mg in the morning and 40 mg in the evening.       Marland Kitchen glipiZIDE (GLUCOTROL) 10 MG tablet Take 10 mg by mouth daily.       Marland Kitchen HYDROcodone-acetaminophen (NORCO/VICODIN) 5-325 MG per tablet Take 1-2 tablets by mouth every 4 (four) hours as  needed for pain.  30 tablet  0  . hyoscyamine (LEVSIN, ANASPAZ) 0.125 MG tablet Take 1 tablet (0.125 mg total) by mouth every 4 (four) hours as needed for cramping (bladder spasms).  40 tablet  4  . insulin glargine (LANTUS) 100 UNIT/ML injection Inject 46 Units into the skin daily. Daily with breakfast      . insulin lispro (HUMALOG) 100 UNIT/ML injection Inject 2-4 Units into the skin daily before breakfast. Sliding scale      . irbesartan (AVAPRO) 150 MG tablet Take 150 mg by mouth daily with breakfast.      . isosorbide mononitrate (IMDUR) 60 MG 24 hr tablet Take 60 mg by mouth daily with supper.       . lansoprazole (PREVACID) 30 MG capsule Take 1 capsule (30 mg total) by mouth daily.  90 capsule  4  . liver oil-zinc oxide (DESITIN) 40 % ointment Apply 1 application topically daily as needed for dry skin. Apply to bottom      . metolazone (ZAROXOLYN) 5 MG tablet Take 7.5 mg by mouth daily.        . phenazopyridine (PYRIDIUM) 100 MG tablet Take 1 tablet (100 mg total) by mouth every 8 (eight) hours as needed for pain (Burning urination.  Will turn urine and body fluids orange.).  30 tablet  1  . potassium chloride SA (K-DUR,KLOR-CON) 20 MEQ tablet Take 20 mEq by mouth 2 (two) times daily.        Marland Kitchen senna-docusate (SENOKOT S) 8.6-50 MG per tablet Take 1 tablet by mouth 2 (two) times daily.  60 tablet  0  . warfarin (COUMADIN) 5 MG tablet Take 5 mg by mouth daily.       . cephALEXin (KEFLEX) 500 MG capsule Take 1 capsule (500 mg total) by mouth 3 (three) times daily. Take for 3 days following surgery. Begin taking again the day before you return for clinic.  18 capsule  0    Results  for orders placed during the hospital encounter of 11/15/12 (from the past 48 hour(s))  CBC WITH DIFFERENTIAL     Status: Abnormal   Collection Time    11/15/12  7:26 PM      Result Value Range   WBC 30.6 (*) 4.0 - 10.5 K/uL   RBC 3.81 (*) 4.22 - 5.81 MIL/uL   Hemoglobin 11.9 (*) 13.0 - 17.0 g/dL   HCT 40.9 (*) 81.1 - 91.4 %   MCV 99.0  78.0 - 100.0 fL   MCH 31.2  26.0 - 34.0 pg   MCHC 31.6  30.0 - 36.0 g/dL   RDW 78.2  95.6 - 21.3 %   Platelets 342  150 - 400 K/uL   Neutrophils Relative % 90 (*) 43 - 77 %   Lymphocytes Relative 5 (*) 12 - 46 %   Monocytes Relative 5  3 - 12 %   Eosinophils Relative 0  0 - 5 %   Basophils Relative 0  0 - 1 %   Neutro Abs 27.6 (*) 1.7 - 7.7 K/uL   Lymphs Abs 1.5  0.7 - 4.0 K/uL   Monocytes Absolute 1.5 (*) 0.1 - 1.0 K/uL   Eosinophils Absolute 0.0  0.0 - 0.7 K/uL   Basophils Absolute 0.0  0.0 - 0.1 K/uL   Smear Review MORPHOLOGY UNREMARKABLE    COMPREHENSIVE METABOLIC PANEL     Status: Abnormal   Collection Time    11/15/12  7:26 PM      Result  Value Range   Sodium 138  135 - 145 mEq/L   Potassium 3.3 (*) 3.5 - 5.1 mEq/L   Chloride 89 (*) 96 - 112 mEq/L   CO2 42 (*) 19 - 32 mEq/L   Comment: CRITICAL RESULT CALLED TO, READ BACK BY AND VERIFIED WITH:     INNANA/[id number]   Glucose, Bld 67 (*) 70 - 99 mg/dL   BUN 36 (*) 6 - 23 mg/dL   Creatinine, Ser 4.40  0.50 - 1.35 mg/dL   Calcium 34.7  8.4 - 42.5 mg/dL   Total Protein 7.6  6.0 - 8.3 g/dL   Albumin 3.4 (*) 3.5 - 5.2 g/dL   AST 23  0 - 37 U/L   ALT 12  0 - 53 U/L   Alkaline Phosphatase 94  39 - 117 U/L   Total Bilirubin 0.3  0.3 - 1.2 mg/dL   GFR calc non Af Amer 50 (*) >90 mL/min   GFR calc Af Amer 58 (*) >90 mL/min   Comment:            The eGFR has been calculated     using the CKD EPI equation.     This calculation has not been     validated in all clinical     situations.     eGFR's persistently     <90 mL/min signify     possible Chronic Kidney Disease.  PRO B  NATRIURETIC PEPTIDE     Status: Abnormal   Collection Time    11/15/12  7:26 PM      Result Value Range   Pro B Natriuretic peptide (BNP) 663.1 (*) 0 - 450 pg/mL  PROTIME-INR     Status: Abnormal   Collection Time    11/15/12  8:10 PM      Result Value Range   Prothrombin Time 18.7 (*) 11.6 - 15.2 seconds   INR 1.61 (*) 0.00 - 1.49  URINALYSIS, ROUTINE W REFLEX MICROSCOPIC     Status: Abnormal   Collection Time    11/15/12  8:18 PM      Result Value Range   Color, Urine ORANGE (*) YELLOW   Comment: BIOCHEMICALS MAY BE AFFECTED BY COLOR   APPearance TURBID (*) CLEAR   Specific Gravity, Urine 1.017  1.005 - 1.030   pH 7.5  5.0 - 8.0   Glucose, UA NEGATIVE  NEGATIVE mg/dL   Hgb urine dipstick LARGE (*) NEGATIVE   Bilirubin Urine NEGATIVE  NEGATIVE   Ketones, ur NEGATIVE  NEGATIVE mg/dL   Protein, ur 956 (*) NEGATIVE mg/dL   Urobilinogen, UA 1.0  0.0 - 1.0 mg/dL   Nitrite POSITIVE (*) NEGATIVE   Leukocytes, UA LARGE (*) NEGATIVE  URINE MICROSCOPIC-ADD ON     Status: None   Collection Time    11/15/12  8:18 PM      Result Value Range   WBC, UA 21-50  <3 WBC/hpf   RBC / HPF 21-50  <3 RBC/hpf   Bacteria, UA RARE  RARE  CG4 I-STAT (LACTIC ACID)     Status: Abnormal   Collection Time    11/15/12  8:49 PM      Result Value Range   Lactic Acid, Venous 3.12 (*) 0.5 - 2.2 mmol/L  HEMOGLOBIN A1C     Status: Abnormal   Collection Time    11/15/12 11:20 PM      Result Value Range   Hemoglobin A1C 6.9 (*) <5.7 %   Comment: (NOTE)  According to the ADA Clinical Practice Recommendations for 2011, when     HbA1c is used as a screening test:      >=6.5%   Diagnostic of Diabetes Mellitus               (if abnormal result is confirmed)     5.7-6.4%   Increased risk of developing Diabetes Mellitus     References:Diagnosis and Classification of Diabetes Mellitus,Diabetes     Care,2011,34(Suppl 1):S62-S69 and  Standards of Medical Care in             Diabetes - 2011,Diabetes Care,2011,34 (Suppl 1):S11-S61.   Mean Plasma Glucose 151 (*) <117 mg/dL   Comment: Performed at [facility]  POCT I-STAT TROPONIN I     Status: None   Collection Time    11/15/12 11:27 PM      Result Value Range   Troponin i, poc 0.00  0.00 - 0.08 ng/mL   Comment 3            Comment: Due to the release kinetics of cTnI,     a negative result within the first hours     of the onset of symptoms does not rule out     myocardial infarction with certainty.     If myocardial infarction is still suspected,     repeat the test at appropriate intervals.  MRSA PCR SCREENING     Status: Abnormal   Collection Time    11/16/12 12:11 AM      Result Value Range   MRSA by PCR POSITIVE (*) NEGATIVE   Comment:            The GeneXpert MRSA Assay (FDA     approved for NASAL specimens     only), is one component of a     comprehensive MRSA colonization     surveillance program. It is not     intended to diagnose MRSA     infection nor to guide or     monitor treatment for     MRSA infections.     RESULT CALLED TO, READ BACK BY AND VERIFIED WITH:     STOCKS,M/2W @0252  ON 11/16/12 BY KARCZEWSKI,S.  GLUCOSE, CAPILLARY     Status: Abnormal   Collection Time    11/16/12  2:05 AM      Result Value Range   Glucose-Capillary 182 (*) 70 - 99 mg/dL   Comment 1 Notify RN    PROTIME-INR     Status: Abnormal   Collection Time    11/16/12  3:30 AM      Result Value Range   Prothrombin Time 18.9 (*) 11.6 - 15.2 seconds   INR 1.63 (*) 0.00 - 1.49  LACTIC ACID, PLASMA     Status: None   Collection Time    11/16/12  3:30 AM      Result Value Range   Lactic Acid, Venous 1.3  0.5 - 2.2 mmol/L  BASIC METABOLIC PANEL     Status: Abnormal   Collection Time    11/16/12  3:30 AM      Result Value Range   Sodium 136  135 - 145 mEq/L   Potassium 3.2 (*) 3.5 - 5.1 mEq/L   Chloride 93 (*) 96 - 112 mEq/L   CO2 39 (*) 19 - 32 mEq/L    Glucose, Bld 193 (*) 70 - 99 mg/dL   BUN 38 (*) 6 - 23 mg/dL   Creatinine, Ser 2.95 (*)  0.50 - 1.35 mg/dL   Calcium 8.6  8.4 - 40.9 mg/dL   GFR calc non Af Amer 44 (*) >90 mL/min   GFR calc Af Amer 51 (*) >90 mL/min   Comment:            The eGFR has been calculated     using the CKD EPI equation.     This calculation has not been     validated in all clinical     situations.     eGFR's persistently     <90 mL/min signify     possible Chronic Kidney Disease.  CBC WITH DIFFERENTIAL     Status: Abnormal   Collection Time    11/16/12  3:30 AM      Result Value Range   WBC 38.1 (*) 4.0 - 10.5 K/uL   Comment: WHITE COUNT CONFIRMED ON SMEAR   RBC 3.34 (*) 4.22 - 5.81 MIL/uL   Hemoglobin 10.2 (*) 13.0 - 17.0 g/dL   HCT 81.1 (*) 91.4 - 78.2 %   MCV 99.1  78.0 - 100.0 fL   MCH 30.5  26.0 - 34.0 pg   MCHC 30.8  30.0 - 36.0 g/dL   RDW 95.6  21.3 - 08.6 %   Platelets 298  150 - 400 K/uL   Neutrophils Relative % 92 (*) 43 - 77 %   Neutro Abs 35.5 (*) 1.7 - 7.7 K/uL   Lymphocytes Relative 4 (*) 12 - 46 %   Lymphs Abs 1.5  0.7 - 4.0 K/uL   Monocytes Relative 4  3 - 12 %   Monocytes Absolute 1.6 (*) 0.1 - 1.0 K/uL   Eosinophils Relative 0  0 - 5 %   Eosinophils Absolute 0.0  0.0 - 0.7 K/uL   Basophils Relative 0  0 - 1 %   Basophils Absolute 0.0  0.0 - 0.1 K/uL  GLUCOSE, CAPILLARY     Status: None   Collection Time    11/16/12  7:38 AM      Result Value Range   Glucose-Capillary 92  70 - 99 mg/dL  GLUCOSE, CAPILLARY     Status: Abnormal   Collection Time    11/16/12 11:51 AM      Result Value Range   Glucose-Capillary 194 (*) 70 - 99 mg/dL   Ct Abdomen Pelvis Wo Contrast  11/16/2012   *RADIOLOGY REPORT*  Clinical Data: Urinary tract infection with sepsis.  Evaluate for potential pyelonephritis.  Fever.  CT ABDOMEN AND PELVIS WITHOUT CONTRAST  Technique:  Multidetector CT imaging of the abdomen and pelvis was performed following the standard protocol without intravenous contrast.   Comparison: CT of the abdomen and pelvis 10/14/2012.  Findings:  Lung Bases: Probable scarring in the left lung base.  Cardiomegaly. Atherosclerotic calcifications in the left main, left anterior descending, left circumflex and right coronary arteries. Calcifications of the aortic valve.  Moderate sized hiatal hernia. Calcified pleural plaques in the posterior aspect of the left hemithorax.  Abdomen/Pelvis:  Small calcified gallstones layering dependently in the gallbladder.  No current findings to suggest acute cholecystitis at this time.  Severe pancreatic atrophy.  Status post splenectomy.  Surgical clips near the gastroesophageal junction.  The unenhanced appearance of the right kidney and bilateral adrenal glands is unremarkable.  An exophytic low attenuation lesion measuring 3.7 cm in diameter extending from the posterior aspect of the left kidney is incompletely characterized on today's noncontrast CT examination, but was previously characterized as a simple cyst.  A left-sided double J ureteral stent with the proximal loop properly reformed within the left renal pelvis and the distal loop properly reformed within the lumen of the urinary bladder.  Numerous colonic diverticula, without surrounding inflammatory changes to suggest acute diverticulitis at this time.  No significant volume of ascites.  No pneumoperitoneum.  No pathologic distension of small bowel.  No definite pathologic lymphadenopathy identified within the abdomen or pelvis.  Foley balloon catheter in place with tip in the lumen of the urinary bladder.  Small amount of gas, dependently in the lumen of the urinary bladder presumably iatrogenic. Urinary bladder wall appears thickened, however, this is likely accentuated by under distension of the urinary bladder. Small amount of stranding in the fat adjacent to the urinary bladder.  Musculoskeletal: There are no aggressive appearing lytic or blastic lesions noted in the visualized portions of the  skeleton.  IMPRESSION: 1. Thickening of the urinary bladder wall with a small amount of soft tissue stranding in the adjacent fat surrounding the urinary bladder may suggest cystitis.  Clinical correlation is recommended.  2.  No imaging findings on today's noncontrast CT examination to strongly suggest presence of pyelonephritis, however, clinical correlation and urinalysis is recommended if there is suspicion for pyelonephritis. 3.  Left-sided double J ureteral stent appears properly located. 4.  Cholelithiasis without evidence to suggest acute cholecystitis at this time. 5.  Colonic diverticulosis without findings to suggest acute diverticulitis at this time. 6.  Status post splenectomy. 7.  Moderate hiatal hernia. 8.  Atherosclerosis, including left main and three post coronary artery disease. 9.  Additional incidental findings, as above.   Original Report Authenticated By: Trudie Reed, M.D.   Dg Chest 2 View  11/15/2012   *RADIOLOGY REPORT*  Clinical Data: Chest pain.  Shortness of breath.  Weakness.  CHEST - 2 VIEW  Comparison: Chest x-ray 10/12/2012.  Findings: Lung volumes are low.  Film is under penetrated, which limits the Diagnostic sensitivity and specificity of this examination.  With these limitations in mind, there are bibasilar opacities (left greater than right), favored to represent subsegmental atelectasis.  No definite pleural effusions. Cephalization of the pulmonary vasculature, with evidence of very mild interstitial pulmonary edema.  Mild cardiomegaly. Retrocardiac density compatible with a moderate sized hiatal hernia. The patient is rotated to the left on today's exam, resulting in distortion of the mediastinal contours and reduced diagnostic sensitivity and specificity for mediastinal pathology. Atherosclerosis of the thoracic aorta. Multiple surgical clips are noted projecting over the upper abdomen.  There appears to be a left-sided double J ureteral stent in position, however  this is incompletely visualized.  IMPRESSION: 1.  Findings, as above, concerning for early congestive heart failure. 2.  Atherosclerosis. 3.  Moderate hiatal hernia. 4.  Postoperative changes, as above.   Original Report Authenticated By: Trudie Reed, M.D.    ROS: General:no colds + fevers, no weight changes Skin:no rashes or ulcers HEENT:no blurred vision, no congestion CV:see HPI PUL:see HPI GI:no diarrhea constipation or melena, no indigestion GU:no hematuria, no dysuria MS:no joint pain, no claudication, chronically wheel chair bound Neuro:no syncope, no lightheadedness Endo:+ diabetes, no thyroid disease   Blood pressure 119/49, pulse 90, temperature 98.6 F (37 C), temperature source Oral, resp. rate 17, height 5\' 10"  (1.778 m), weight 226 lb 3.1 oz (102.6 kg), SpO2 97.00%. PE:  General:Pleasant affect, NAD Skin:Warm and dry, brisk capillary refill HEENT:normocephalic, sclera clear, mucus membranes moist Neck:supple, no JVD, no bruits  Heart:irreg irreg with 2/6 systolic murmur,  no gallup, rub or click Lungs:clear without rales, rhonchi, or wheezes ZOX:WRUE, non tender, + BS, do not palpate liver spleen or masses Ext:no lower ext edema on the lt, 1+ on the Rt, weak pedal pulses, 2+ radial pulses Neuro:alert and oriented, MAE, follows commands, + facial symmetry, very hard of Hearing   Assessment/Plan Principal Problem:   Sepsis Active Problems:   MALIGNANT NEOPLASM OF KIDNEY EXCEPT PELVIS   DIABETES MELLITUS, TYPE II, ON INSULIN   HYPERTENSION   RESPIRATORY FAILURE, CHRONIC   GERD   Pulmonary embolism   UTI (urinary tract infection)   Atrial fibrillation, permanent   Coronary artery disease, last cath 2008 occluded nondom, LCX, wth moderate ostial RCA disease, normal LV function   Warfarin anticoagulation, chronic  PLAN: echo results pending. Fever improving.  Afib rate controlled, decreased lactic acidosis.   Noted on CT scan: Atherosclerosis, including left main  and three post coronary artery disease Continues on coumadin, with some Lovenox until INR therapeutic, Imdur, also continues on Metolazone. If abnormal echo may need Lexiscan myoview but for now he is fairly stable from cardiology aspect.  Leone Brand  Nurse Practitioner Certified [facility] Pager [phone] 11/16/2012, 3:57 PM     I have seen and examined the patient along with Nada Boozer, PA.  I have reviewed the chart, notes and new data.  I agree with PA's note.  Key new complaints: fever and chills resolved - he feels much better; no angina Key examination changes: ventricular rate much better with treatment of acute illness Key new findings / data: subtherapeutic INR  PLAN: Agree with Lovenox until INR back >2. Note likelihood of warfarin antibiotic interaction. No evidence of CHF or acute coronary insufficiency. No need for additional rate control meds.  Please reconsult if there are new cardiac issues, will follow from a distance and arrange follow up w Dr. Allyson Sabal at the time of discharge.  Thurmon Fair, MD, [facility] [phone] 11/16/2012, 7:02 PM

## 2012-11-16 NOTE — Progress Notes (Signed)
CARE MANAGEMENT NOTE 11/16/2012  Patient:  Frederick [name],Frederick [name]   Account Number:  [id number]  Date Initiated:  11/16/2012  Documentation initiated by:  Nile Dorning  Subjective/Objective Assessment:   pt admitted with sepsis and urinary retention.     Action/Plan:   lives at home with his spouse   Anticipated DC Date:  11/19/2012   Anticipated DC Plan:  HOME/SELF CARE  In-house referral  NA      DC Planning Services  NA      [facility] Choice  NA   Choice offered to / List presented to:  NA   DME arranged  NA      DME agency  NA     HH arranged  NA      HH agency  NA   Status of service:  In process, will continue to follow Medicare Important Message given?  NA - LOS <3 / Initial given by admissions (If response is "NO", the following Medicare IM given date fields will be blank) Date Medicare IM given:   Date Additional Medicare IM given:    Discharge Disposition:    Per UR Regulation:  Reviewed for med. necessity/level of care/duration of stay  If discussed at Long Length of Stay Meetings, dates discussed:    Comments:  [id number] Earlene Plater RN, BSN, CCN: [phone] Case management. Chart reviewed for discharge planning and present needs. Discharge needs: none present at time of review. Next chart review due:  [id number]

## 2012-11-16 NOTE — Progress Notes (Signed)
ANTIBIOTIC CONSULT NOTE - INITIAL  Pharmacy Consult for Vancomycin/Cefepime Indication: rule out sepsis  Allergies  Allergen Reactions  . Hydromorphone Hcl Other (See Comments)    Dilaudid caused Confusion   . Morphine Other (See Comments)    confusion  . Pregabalin Other (See Comments)    lyrica - caused hallucinations    Patient Measurements: Height: 5\' 10"  (177.8 cm) Weight: 226 lb 3.1 oz (102.6 kg) IBW/kg (Calculated) : 73 Adjusted Body Weight:   Vital Signs: Temp: 98 F (36.7 C) (08/12 0010) Temp src: Oral (08/12 0010) BP: 90/69 mmHg (08/12 0400) Pulse Rate: 98 (08/12 0400) Intake/Output from previous day: 08/11 0701 - 08/12 0700 In: 1675 [I.V.:450; IV Piggyback:550] Out: -  Intake/Output from this shift: Total I/O In: 1675 [I.V.:450; Other:675; IV Piggyback:550] Out: -   Labs:  Recent Labs  11/15/12 1926 11/16/12 0330  WBC 30.6* 38.1*  HGB 11.9* 10.2*  PLT 342 298  CREATININE 1.29 1.43*   Estimated Creatinine Clearance: 48.6 ml/min (by C-G formula based on Cr of 1.43). No results found for this basename: VANCOTROUGH, Leodis Binet, VANCORANDOM, GENTTROUGH, GENTPEAK, GENTRANDOM, TOBRATROUGH, TOBRAPEAK, TOBRARND, AMIKACINPEAK, AMIKACINTROU, AMIKACIN,  in the last 72 hours   Microbiology: Recent Results (from the past 720 hour(s))  MRSA PCR SCREENING     Status: Abnormal   Collection Time    11/16/12 12:11 AM      Result Value Range Status   MRSA by PCR POSITIVE (*) NEGATIVE Final   Comment:            The GeneXpert MRSA Assay (FDA     approved for NASAL specimens     only), is one component of a     comprehensive MRSA colonization     surveillance program. It is not     intended to diagnose MRSA     infection nor to guide or     monitor treatment for     MRSA infections.     RESULT CALLED TO, READ BACK BY AND VERIFIED WITH:     STOCKS,M/2W @0252  ON 11/16/12 BY KARCZEWSKI,S.    Medical History: Past Medical History  Diagnosis Date  . Diabetes  mellitus   . Hypertension   . Hypercholesterolemia   . Esophageal stricture   . Pulmonary embolism 2008    chronic coumadin   . Coronary artery disease     DR. BERRY IS PT'S CARDIOLOGIST  . Atrial fibrillation     CHRONIC COUMADIN  . Anxiety     SINCE 1975   . Depression     SINCE 1975    . Shortness of breath     NOT SURE WHAT CAUSES SOB - BUT HE IS NOT ABLE TO BE ACTIVE - AND IS SOB WITH ANY ACTIVITY - USES OXYGEN 2 L NASAL CANNULA - ALL THE TIME -EXCEPT WHEN IN SHOWER OR CHANGING CLOTHES  . Peripheral vascular disease     TOLD SOME SMALL AMOUNT OF BLOCKAGE IN LEG WHEN HEART CATH DONE 2008  . Bladder tumor     HAS HAD HEMATURIA OFF AND ON - HAS FOLEY CATH THAT WAS PLACED JUNE 20TH, 2014  . GERD (gastroesophageal reflux disease)   . Edema     FEET AND LEGS MOST DAYS - SOMETIMES WEARS COMPRESSION HOSE  . Cancer     SKIN CANCERS  . Arthritis     S/P BILATERAL TOTAL KNEE REPLACEMENTS - BUT BOTH KNEES PAINFUL AND JOINTS WORN OUT; BAD RIGHT HIP - BUT NOT A CANDIDATE FOR  HIP PREPLACMENT;  HAS SEVERE LOWER  BACK AND LEG PAIN - HAS SPINAL STENOSIS AND BULGING DISC; CURVATURE OF UPPER SPINE - UNABLE TO LAY HIS HEAD FLAT.  Marland Kitchen Pneumonia 2005  . Difficult intubation     DUE TO LIMITED NECK FLEXION AND SHORT NECK--ANESTHESIA RECORD FROM 2007 VATS SURGERY AT [facility] OBTAINED AND ON PT'S CHART.  Marland Kitchen Problems with hearing     WEARS BILATERAL HEARING AIDS    Medications:  Anti-infectives   Start     Dose/Rate Route Frequency Ordered Stop   11/16/12 0000  vancomycin (VANCOCIN) 1,500 mg in sodium chloride 0.9 % 500 mL IVPB     1,500 mg 250 mL/hr over 120 Minutes Intravenous  Once 11/15/12 2349 11/16/12 0225   11/15/12 2345  ceFEPIme (MAXIPIME) 1 g in dextrose 5 % 50 mL IVPB     1 g 100 mL/hr over 30 Minutes Intravenous Every 12 hours 11/15/12 2334     11/15/12 2045  cefTRIAXone (ROCEPHIN) 1 g in dextrose 5 % 50 mL IVPB  Status:  Discontinued     1 g 100 mL/hr over 30 Minutes Intravenous Every 24  hours 11/15/12 2039 11/15/12 2332     Assessment: Patient with fever and r/o sepsis. First dose of antibiotics already given.  Patient with reduced renal function.  Goal of Therapy:  Vancomycin trough level 15-20 mcg/ml Cefepime dosed based on patient weight and renal function  Plan:  Measure antibiotic drug levels at steady state Follow up culture results Cefepime 1gm iv q12hr Vancomycin 1500mg  iv q24hr  Aleene Davidson Crowford 11/16/2012,5:41 AM

## 2012-11-16 NOTE — Progress Notes (Signed)
ANTICOAGULATION CONSULT NOTE - Initial Consult  Pharmacy Consult for warfarin/lovenox Indication: A. fib and history of PE on chronic Coumadin   Allergies  Allergen Reactions  . Hydromorphone Hcl Other (See Comments)    Dilaudid caused Confusion   . Morphine Other (See Comments)    confusion  . Pregabalin Other (See Comments)    lyrica - caused hallucinations    Patient Measurements: Height: 5\' 10"  (177.8 cm) Weight: 226 lb 3.1 oz (102.6 kg) IBW/kg (Calculated) : 73 Heparin Dosing Weight:   Vital Signs: Temp: 98 F (36.7 C) (08/12 0010) Temp src: Oral (08/12 0010) BP: 90/69 mmHg (08/12 0400) Pulse Rate: 98 (08/12 0400)  Labs:  Recent Labs  11/15/12 1926 11/15/12 2010 11/16/12 0330  HGB 11.9*  --  10.2*  HCT 37.7*  --  33.1*  PLT 342  --  298  LABPROT  --  18.7* 18.9*  INR  --  1.61* 1.63*  CREATININE 1.29  --  1.43*    Estimated Creatinine Clearance: 48.6 ml/min (by C-G formula based on Cr of 1.43).   Medical History: Past Medical History  Diagnosis Date  . Diabetes mellitus   . Hypertension   . Hypercholesterolemia   . Esophageal stricture   . Pulmonary embolism 2008    chronic coumadin   . Coronary artery disease     DR. BERRY IS PT'S CARDIOLOGIST  . Atrial fibrillation     CHRONIC COUMADIN  . Anxiety     SINCE 1975   . Depression     SINCE 1975    . Shortness of breath     NOT SURE WHAT CAUSES SOB - BUT HE IS NOT ABLE TO BE ACTIVE - AND IS SOB WITH ANY ACTIVITY - USES OXYGEN 2 L NASAL CANNULA - ALL THE TIME -EXCEPT WHEN IN SHOWER OR CHANGING CLOTHES  . Peripheral vascular disease     TOLD SOME SMALL AMOUNT OF BLOCKAGE IN LEG WHEN HEART CATH DONE 2008  . Bladder tumor     HAS HAD HEMATURIA OFF AND ON - HAS FOLEY CATH THAT WAS PLACED JUNE 20TH, 2014  . GERD (gastroesophageal reflux disease)   . Edema     FEET AND LEGS MOST DAYS - SOMETIMES WEARS COMPRESSION HOSE  . Cancer     SKIN CANCERS  . Arthritis     S/P BILATERAL TOTAL KNEE  REPLACEMENTS - BUT BOTH KNEES PAINFUL AND JOINTS WORN OUT; BAD RIGHT HIP - BUT NOT A CANDIDATE FOR HIP PREPLACMENT;  HAS SEVERE LOWER  BACK AND LEG PAIN - HAS SPINAL STENOSIS AND BULGING DISC; CURVATURE OF UPPER SPINE - UNABLE TO LAY HIS HEAD FLAT.  Marland Kitchen Pneumonia 2005  . Difficult intubation     DUE TO LIMITED NECK FLEXION AND SHORT NECK--ANESTHESIA RECORD FROM 2007 VATS SURGERY AT [facility] OBTAINED AND ON PT'S CHART.  Marland Kitchen Problems with hearing     WEARS BILATERAL HEARING AIDS    Medications:  Scheduled:  . sodium chloride   Intravenous STAT  . atorvastatin  40 mg Oral QPM  . ceFEPime (MAXIPIME) IV  1 g Intravenous Q12H  . celecoxib  100 mg Oral BID  . chlordiazePOXIDE  10 mg Oral TID  . Chlorhexidine Gluconate Cloth  6 each Topical Q0600  . DULoxetine  60 mg Oral QHS  . enoxaparin (LOVENOX) injection  40 mg Subcutaneous Daily  . insulin aspart  0-15 Units Subcutaneous TID WC  . insulin glargine  20 Units Subcutaneous Daily  . isosorbide mononitrate  60 mg Oral Q supper  . metolazone  7.5 mg Oral Daily  . mupirocin ointment  1 application Nasal BID  . pantoprazole  40 mg Oral Daily  . potassium chloride SA  20 mEq Oral BID  . senna-docusate  1 tablet Oral BID  . sodium chloride  3 mL Intravenous Q12H  . vancomycin  1,500 mg Intravenous Q24H  . warfarin  5 mg Oral ONCE-1800  . Warfarin - Pharmacist Dosing Inpatient   Does not apply q1800    Assessment: Patient with A. fib and history of PE on chronic Coumadin.  Patient also on lovenox 40mg  sq q24hr PTA.  Goal of Therapy:  INR 2-3 Monitor platelets by anticoagulation protocol: Yes   Plan:  Continue home lovenox per MD note. Warfarin 5mg  po x1 at 1800 Daily INR  Aleene Davidson Crowford 11/16/2012,5:54 AM

## 2012-11-16 NOTE — Progress Notes (Addendum)
[facility] PROGRESS NOTE  Frederick [name] ZOX:[id number] DOB: 03-07-1930 DOA: 11/15/2012 PCP: Georgianne Fick, MD  Assessment/Plan: 1-SIRS/Sepsis: secondary to UTI -cx's still pending (but so far no growth) -WBC's remains elevated -no fever -will continue vanc and cefepime -appreciated urology assistance and rec's  2-DM type 2: -A1C 6.9 -continue SSI and lantus -will adjust as needed -continue holding glipizide for now  3-Hx of DVT/PE: continue coumadin and lovenox bridging per pharmacy  4-HTN: soft but stable. Will continue IVF's and continue holding antihypertensive agents  5-Chronic resp failure: per patient at baseline -base on CXR findings will follow 2-D echo -continue oxygen supplementation -strict I's and O's  6-renal insufficiency: acute and most likely due to low BP, UTI and continue use of lasix and ARB. -hold nephrotoxic agents -continue IVF's -continue treatment for UTI  7-GERD: continue PPI  8-Bladder tumor: per urology  9-atrial fibrillation: rate controlled. Continue metoprolol and coumadin per pharmacy.   Code Status: Full Family Communication: wife at bedside Disposition Plan: home when medically stable   Consultants:  Urology (Dr. Margarita Grizzle)  Procedures:  See below for x-ray reports  Antibiotics:  vanc  cefepime  HPI/Subjective: Afebrile, denies CP, SOB  Objective: Filed Vitals:   11/16/12 1200  BP:   Pulse:   Temp: 98.6 F (37 C)  Resp:     Intake/Output Summary (Last 24 hours) at 11/16/12 1246 Last data filed at 11/16/12 0900  Gross per 24 hour  Intake   2275 ml  Output    300 ml  Net   1975 ml   Filed Weights   11/16/12 0010  Weight: 102.6 kg (226 lb 3.1 oz)    Exam:   General:  Afebrile, NAD; feeling better  Cardiovascular: rate controlled; no rubs or gallops  Respiratory: good air movement, no wheezing  Abdomen: soft, NT, ND; slight discomfort with palpation over suprapubic area (but better  according to patient); positive BS  Musculoskeletal: no edema, no cyanosis or clubbing  Data Reviewed: Basic Metabolic Panel:  Recent Labs Lab 11/15/12 1926 11/16/12 0330  NA 138 136  K 3.3* 3.2*  CL 89* 93*  CO2 42* 39*  GLUCOSE 67* 193*  BUN 36* 38*  CREATININE 1.29 1.43*  CALCIUM 10.0 8.6   Liver Function Tests:  Recent Labs Lab 11/15/12 1926  AST 23  ALT 12  ALKPHOS 94  BILITOT 0.3  PROT 7.6  ALBUMIN 3.4*   CBC:  Recent Labs Lab 11/15/12 1926 11/16/12 0330  WBC 30.6* 38.1*  NEUTROABS 27.6* 35.5*  HGB 11.9* 10.2*  HCT 37.7* 33.1*  MCV 99.0 99.1  PLT 342 298   BNP (last 3 results)  Recent Labs  11/15/12 1926  PROBNP 663.1*   CBG:  Recent Labs Lab 11/16/12 0205 11/16/12 0738 11/16/12 1151  GLUCAP 182* 92 194*    Recent Results (from the past 240 hour(s))  MRSA PCR SCREENING     Status: Abnormal   Collection Time    11/16/12 12:11 AM      Result Value Range Status   MRSA by PCR POSITIVE (*) NEGATIVE Final   Comment:            The GeneXpert MRSA Assay (FDA     approved for NASAL specimens     only), is one component of a     comprehensive MRSA colonization     surveillance program. It is not     intended to diagnose MRSA     infection nor to guide or  monitor treatment for     MRSA infections.     RESULT CALLED TO, READ BACK BY AND VERIFIED WITH:     STOCKS,M/2W @0252  ON 11/16/12 BY KARCZEWSKI,S.     Studies: Ct Abdomen Pelvis Wo Contrast  11/16/2012   *RADIOLOGY REPORT*  Clinical Data: Urinary tract infection with sepsis.  Evaluate for potential pyelonephritis.  Fever.  CT ABDOMEN AND PELVIS WITHOUT CONTRAST  Technique:  Multidetector CT imaging of the abdomen and pelvis was performed following the standard protocol without intravenous contrast.  Comparison: CT of the abdomen and pelvis 10/14/2012.  Findings:  Lung Bases: Probable scarring in the left lung base.  Cardiomegaly. Atherosclerotic calcifications in the left main, left  anterior descending, left circumflex and right coronary arteries. Calcifications of the aortic valve.  Moderate sized hiatal hernia. Calcified pleural plaques in the posterior aspect of the left hemithorax.  Abdomen/Pelvis:  Small calcified gallstones layering dependently in the gallbladder.  No current findings to suggest acute cholecystitis at this time.  Severe pancreatic atrophy.  Status post splenectomy.  Surgical clips near the gastroesophageal junction.  The unenhanced appearance of the right kidney and bilateral adrenal glands is unremarkable.  An exophytic low attenuation lesion measuring 3.7 cm in diameter extending from the posterior aspect of the left kidney is incompletely characterized on today's noncontrast CT examination, but was previously characterized as a simple cyst.  A left-sided double J ureteral stent with the proximal loop properly reformed within the left renal pelvis and the distal loop properly reformed within the lumen of the urinary bladder.  Numerous colonic diverticula, without surrounding inflammatory changes to suggest acute diverticulitis at this time.  No significant volume of ascites.  No pneumoperitoneum.  No pathologic distension of small bowel.  No definite pathologic lymphadenopathy identified within the abdomen or pelvis.  Foley balloon catheter in place with tip in the lumen of the urinary bladder.  Small amount of gas, dependently in the lumen of the urinary bladder presumably iatrogenic. Urinary bladder wall appears thickened, however, this is likely accentuated by under distension of the urinary bladder. Small amount of stranding in the fat adjacent to the urinary bladder.  Musculoskeletal: There are no aggressive appearing lytic or blastic lesions noted in the visualized portions of the skeleton.  IMPRESSION: 1. Thickening of the urinary bladder wall with a small amount of soft tissue stranding in the adjacent fat surrounding the urinary bladder may suggest cystitis.   Clinical correlation is recommended.  2.  No imaging findings on today's noncontrast CT examination to strongly suggest presence of pyelonephritis, however, clinical correlation and urinalysis is recommended if there is suspicion for pyelonephritis. 3.  Left-sided double J ureteral stent appears properly located. 4.  Cholelithiasis without evidence to suggest acute cholecystitis at this time. 5.  Colonic diverticulosis without findings to suggest acute diverticulitis at this time. 6.  Status post splenectomy. 7.  Moderate hiatal hernia. 8.  Atherosclerosis, including left main and three post coronary artery disease. 9.  Additional incidental findings, as above.   Original Report Authenticated By: Trudie Reed, M.D.   Dg Chest 2 View  11/15/2012   *RADIOLOGY REPORT*  Clinical Data: Chest pain.  Shortness of breath.  Weakness.  CHEST - 2 VIEW  Comparison: Chest x-ray 10/12/2012.  Findings: Lung volumes are low.  Film is under penetrated, which limits the Diagnostic sensitivity and specificity of this examination.  With these limitations in mind, there are bibasilar opacities (left greater than right), favored to represent subsegmental atelectasis.  No  definite pleural effusions. Cephalization of the pulmonary vasculature, with evidence of very mild interstitial pulmonary edema.  Mild cardiomegaly. Retrocardiac density compatible with a moderate sized hiatal hernia. The patient is rotated to the left on today's exam, resulting in distortion of the mediastinal contours and reduced diagnostic sensitivity and specificity for mediastinal pathology. Atherosclerosis of the thoracic aorta. Multiple surgical clips are noted projecting over the upper abdomen.  There appears to be a left-sided double J ureteral stent in position, however this is incompletely visualized.  IMPRESSION: 1.  Findings, as above, concerning for early congestive heart failure. 2.  Atherosclerosis. 3.  Moderate hiatal hernia. 4.  Postoperative  changes, as above.   Original Report Authenticated By: Trudie Reed, M.D.    Scheduled Meds: . antiseptic oral rinse  15 mL Mouth Rinse BID  . atorvastatin  40 mg Oral QPM  . ceFEPime (MAXIPIME) IV  1 g Intravenous Q12H  . chlordiazePOXIDE  10 mg Oral TID  . Chlorhexidine Gluconate Cloth  6 each Topical Q0600  . DULoxetine  60 mg Oral QHS  . enoxaparin (LOVENOX) injection  40 mg Subcutaneous Daily  . insulin aspart  0-15 Units Subcutaneous TID WC  . insulin glargine  20 Units Subcutaneous Daily  . isosorbide mononitrate  60 mg Oral Q supper  . metolazone  7.5 mg Oral Daily  . mupirocin ointment  1 application Nasal BID  . pantoprazole  40 mg Oral Daily  . potassium chloride SA  20 mEq Oral BID  . senna-docusate  1 tablet Oral BID  . sodium chloride  3 mL Intravenous Q12H  . vancomycin  1,500 mg Intravenous Q24H  . warfarin  7.5 mg Oral ONCE-1800  . Warfarin - Pharmacist Dosing Inpatient   Does not apply q1800   Continuous Infusions: . sodium chloride      Principal Problem:   Sepsis Active Problems:   MALIGNANT NEOPLASM OF KIDNEY EXCEPT PELVIS   DIABETES MELLITUS, TYPE II, ON INSULIN   HYPERTENSION   RESPIRATORY FAILURE, CHRONIC   GERD   Pulmonary embolism   UTI (urinary tract infection)   A-fib    Time spent: >30 minutes    Regine Christian  [facility] Pager [phone]. If 7PM-7AM, please contact night-coverage at [email or web address], password [facility] 11/16/2012, 12:46 PM  LOS: 1 day

## 2012-11-16 NOTE — Progress Notes (Addendum)
11/16/12 0100- Pt admitted from ED via stretcher @0000 . Pt denies having any home health care but is wheelchair bound at home with wife. Skin assessment wnl- restore dressing removed from sacrum where skin was reddened but blanchable. Prophylactic sacral dressing applied. Pt is refusing to turn in bed after explaining benefits. VSS. Will continue to monitor.

## 2012-11-16 NOTE — Consult Note (Signed)
Urology Consult  Requesting provider:  Dr. Gonzella Lex  CC: Fever. SIRS.  HPI:  79 year old male is admitted to the ICU with systemic inflammatory response and fever.  He likely has sepsis, but cultures are pending.  He is a patient who was recently discovered to have bladder cancer.  This was discovered during workup for gross hematuria.  He had a transurethral resection of the bladder tumor on 11/03/12.  He was able to be discharged home the same day and he was being managed with a Lovenox bridge for his anticoagulation.  Surgery was uncomplicated.  He did have involvement of his left ureteral orifice and therefore a left ureter stent was placed after the orifice was resected.  Pathology revealed high-grade tumor invading the muscle.  He had called my office late yesterday complaining of right years but no fever.  I recommended proceeding to the ER given his multiple comorbidities.  There he was found to have a fever to 101.8.  He had been admitted by the medicine service.  He had a CT scan 11/15/12 which I reviewed today.  This shows his left ureter stent is in good position and there is no hydronephrosis bilaterally.  His Foley catheter is in appropriate position and draining his bladder well.  His urine is clear yellow today  Without evidence of gross hematuria.  Following his procedure I did a cystogram which showed no extravasation.  The patient states that he was experiencing right years at home but no fever.  He has had a decreased appetite.  Otherwise, he has not had much hematuria.  He has some bladder spasms.  This episode began yesterday.  He is currently stable in the ICU.  PMH: Past Medical History  Diagnosis Date  . Diabetes mellitus   . Hypertension   . Hypercholesterolemia   . Esophageal stricture   . Pulmonary embolism 2008    chronic coumadin   . Coronary artery disease     DR. BERRY IS PT'S CARDIOLOGIST  . Atrial fibrillation     CHRONIC COUMADIN  . Anxiety     SINCE 1975   .  Depression     SINCE 1975    . Shortness of breath     NOT SURE WHAT CAUSES SOB - BUT HE IS NOT ABLE TO BE ACTIVE - AND IS SOB WITH ANY ACTIVITY - USES OXYGEN 2 L NASAL CANNULA - ALL THE TIME -EXCEPT WHEN IN SHOWER OR CHANGING CLOTHES  . Peripheral vascular disease     TOLD SOME SMALL AMOUNT OF BLOCKAGE IN LEG WHEN HEART CATH DONE 2008  . Bladder tumor     HAS HAD HEMATURIA OFF AND ON - HAS FOLEY CATH THAT WAS PLACED JUNE 20TH, 2014  . GERD (gastroesophageal reflux disease)   . Edema     FEET AND LEGS MOST DAYS - SOMETIMES WEARS COMPRESSION HOSE  . Cancer     SKIN CANCERS  . Arthritis     S/P BILATERAL TOTAL KNEE REPLACEMENTS - BUT BOTH KNEES PAINFUL AND JOINTS WORN OUT; BAD RIGHT HIP - BUT NOT A CANDIDATE FOR HIP PREPLACMENT;  HAS SEVERE LOWER  BACK AND LEG PAIN - HAS SPINAL STENOSIS AND BULGING DISC; CURVATURE OF UPPER SPINE - UNABLE TO LAY HIS HEAD FLAT.  Marland Kitchen Pneumonia 2005  . Difficult intubation     DUE TO LIMITED NECK FLEXION AND SHORT NECK--ANESTHESIA RECORD FROM 2007 VATS SURGERY AT [facility] OBTAINED AND ON PT'S CHART.  Marland Kitchen Problems with hearing  WEARS BILATERAL HEARING AIDS    PSH: Past Surgical History  Procedure Laterality Date  . Shoulder surgery      bilateral  . Pancreas surgery  2001    TUMOR REMOVED FROM PANCREAS AND SPLEEN ALSO REMOVED  . Total knee arthroplasty      bilateral  . Lung surgery  2007    non-cancer  VIDEO ASSISTED THORCOTOMY TO REMOVE FLUID / DECORTICATION. DR. Edwyna Shell  . Basil cell    . Cardiac catheterization  2008  . Esophageal stretch    . Skin cancer removed from left side of head - required skin graft from left leg    . Cartilage repair 198- left knee    . Joint replacement    . Transurethral resection of bladder tumor with gyrus (turbt-gyrus) N/A 11/03/2012    Procedure: TRANSURETHRAL RESECTION OF BLADDER TUMOR WITH GYRUS (TURBT-GYRUS);  Surgeon: Milford Cage, MD;  Location: [facility];  Service: Urology;  Laterality: N/A;  .  Cystoscopy/retrograde/ureteroscopy  11/03/2012    Procedure: CYSTOSCOPY/RETROGRADE/ LEFT URETEROSCOPY AND STENT PLACEMENT;  Surgeon: Milford Cage, MD;  Location: [facility];  Service: Urology;;  . Theador Hawthorne N/A 11/03/2012    Procedure: CYSTOGRAM;  Surgeon: Milford Cage, MD;  Location: [facility];  Service: Urology;  Laterality: N/A;    Allergies: Allergies  Allergen Reactions  . Hydromorphone Hcl Other (See Comments)    Dilaudid caused Confusion   . Morphine Other (See Comments)    confusion  . Pregabalin Other (See Comments)    lyrica - caused hallucinations    Medications: Prescriptions prior to admission  Medication Sig Dispense Refill  . atorvastatin (LIPITOR) 40 MG tablet Take 40 mg by mouth every evening.        . celecoxib (CELEBREX) 100 MG capsule Take 100 mg by mouth 2 (two) times daily.        . chlordiazePOXIDE (LIBRIUM) 10 MG capsule Take 10 mg by mouth 3 (three) times daily.        . DULoxetine (CYMBALTA) 60 MG capsule Take 60 mg by mouth at bedtime.        . enoxaparin (LOVENOX) 40 MG/0.4ML injection Inject 40 mg into the skin daily. Stops coumadin for surgery - last day to take coumadin is 10/28/12. Starts lovenox bridge once a day in the am  -first dose is 10/29/12 --last dose is 11/02/12--no lovenox the day of surgery.  Told to resume lovenox and coumadin both - the day after surgery.  Pt to go to [facility] coumadin clinic on Monday 8/4 for pt, inr check.      . furosemide (LASIX) 40 MG tablet Take 40-80 mg by mouth 2 (two) times daily. Takes 80 mg in the morning and 40 mg in the evening.       Marland Kitchen glipiZIDE (GLUCOTROL) 10 MG tablet Take 10 mg by mouth daily.       Marland Kitchen HYDROcodone-acetaminophen (NORCO/VICODIN) 5-325 MG per tablet Take 1-2 tablets by mouth every 4 (four) hours as needed for pain.  30 tablet  0  . hyoscyamine (LEVSIN, ANASPAZ) 0.125 MG tablet Take 1 tablet (0.125 mg total) by mouth every 4 (four) hours as needed for cramping (bladder spasms).   40 tablet  4  . insulin glargine (LANTUS) 100 UNIT/ML injection Inject 46 Units into the skin daily. Daily with breakfast      . insulin lispro (HUMALOG) 100 UNIT/ML injection Inject 2-4 Units into the skin daily before breakfast. Sliding scale      .  irbesartan (AVAPRO) 150 MG tablet Take 150 mg by mouth daily with breakfast.      . isosorbide mononitrate (IMDUR) 60 MG 24 hr tablet Take 60 mg by mouth daily with supper.       . lansoprazole (PREVACID) 30 MG capsule Take 1 capsule (30 mg total) by mouth daily.  90 capsule  4  . liver oil-zinc oxide (DESITIN) 40 % ointment Apply 1 application topically daily as needed for dry skin. Apply to bottom      . metolazone (ZAROXOLYN) 5 MG tablet Take 7.5 mg by mouth daily.        . phenazopyridine (PYRIDIUM) 100 MG tablet Take 1 tablet (100 mg total) by mouth every 8 (eight) hours as needed for pain (Burning urination.  Will turn urine and body fluids orange.).  30 tablet  1  . potassium chloride SA (K-DUR,KLOR-CON) 20 MEQ tablet Take 20 mEq by mouth 2 (two) times daily.        Marland Kitchen senna-docusate (SENOKOT S) 8.6-50 MG per tablet Take 1 tablet by mouth 2 (two) times daily.  60 tablet  0  . warfarin (COUMADIN) 5 MG tablet Take 5 mg by mouth daily.       . cephALEXin (KEFLEX) 500 MG capsule Take 1 capsule (500 mg total) by mouth 3 (three) times daily. Take for 3 days following surgery. Begin taking again the day before you return for clinic.  18 capsule  0     Social History: History   Social History  . Marital Status: Married    Spouse Name: N/A    Number of Children: N/A  . Years of Education: N/A   Occupational History  . Retired    Social History Main Topics  . Smoking status: Never Smoker   . Smokeless tobacco: Never Used  . Alcohol Use: No  . Drug Use: No  . Sexually Active: Not on file   Other Topics Concern  . Not on file   Social History Narrative  . No narrative on file    Family History: Family History  Problem Relation Age  of Onset  . Heart disease Mother   . Melanoma Father     Review of Systems: Positive: Rigors, poor appetite. Negative: Chest pain, nausea, rash.  A further 10 point review of systems was negative except what is listed in the HPI.  Physical Exam: Filed Vitals:   11/16/12 0400  BP: 90/69  Pulse: 98  Temp: 100.9 F (38.3 C)  Resp: 17    General: No acute distress.  Awake. Head:  Normocephalic.  Atraumatic. ENT:  EOMI.  Mucous membranes moist Neck:  Supple.  No lymphadenopathy. CV:  S1 present. S2 present. Regular rate. Pulmonary: Equal effort bilaterally.  Clear to auscultation bilaterally. Abdomen: Soft.  Non- tender to palpation. Skin:  Normal turgor.  No visible rash. Extremity: No gross deformity of bilateral upper extremities.  No gross deformity of    bilateral lower extremities. Neurologic: Alert. Appropriate mood.  Penis/GU: Foley in place. Urine yellow.  No lesions.   Studies:  Recent Labs     11/15/12  1926  11/16/12  0330  HGB  11.9*  10.2*  WBC  30.6*  38.1*  PLT  342  298    Recent Labs     11/15/12  1926  11/16/12  0330  NA  138  136  K  3.3*  3.2*  CL  89*  93*  CO2  42*  39*  BUN  36*  38*  CREATININE  1.29  1.43*  CALCIUM  10.0  8.6  GFRNONAA  50*  44*  GFRAA  58*  51*     Recent Labs     11/15/12  2010  11/16/12  0330  INR  1.61*  1.63*     No components found with this basename: ABG,     Assessment:  Fever. SIRS. Likely sepsis.  Plan: -I appreciate hospitalist management. -Continue supportive care and follow up cultures.  -No benefit in changing catheter at this time. -No additional intervention or imaging indicated for a GU point of view. -Will continue to follow.    Pager: [phone]    CC: Dr. Gonzella Lex

## 2012-11-17 LAB — URINE CULTURE

## 2012-11-17 LAB — BASIC METABOLIC PANEL
CO2: 38 mEq/L — ABNORMAL HIGH (ref 19–32)
GFR calc non Af Amer: 46 mL/min — ABNORMAL LOW (ref >90)
Glucose, Bld: 141 mg/dL — ABNORMAL HIGH (ref 70–99)
Potassium: 3.7 mEq/L (ref 3.5–5.1)
Sodium: 138 mEq/L (ref 135–145)

## 2012-11-17 LAB — CBC
Hemoglobin: 9.7 g/dL — ABNORMAL LOW (ref 13.0–17.0)
RBC: 3.18 MIL/uL — ABNORMAL LOW (ref 4.22–5.81)

## 2012-11-17 LAB — GLUCOSE, CAPILLARY
Glucose-Capillary: 114 mg/dL — ABNORMAL HIGH (ref 70–99)
Glucose-Capillary: 224 mg/dL — ABNORMAL HIGH (ref 70–99)
Glucose-Capillary: 231 mg/dL — ABNORMAL HIGH (ref 70–99)

## 2012-11-17 LAB — PROTIME-INR
INR: 2.4 — ABNORMAL HIGH (ref 0.00–1.49)
Prothrombin Time: 25.4 seconds — ABNORMAL HIGH (ref 11.6–15.2)

## 2012-11-17 MED ORDER — ISOSORBIDE MONONITRATE ER 30 MG PO TB24
30.0000 mg | ORAL_TABLET | Freq: Every day | ORAL | Status: DC
  Administered 2012-11-17 – 2012-11-19 (×3): 30 mg via ORAL
  Filled 2012-11-17 (×5): qty 1

## 2012-11-17 MED ORDER — HYDROCODONE-ACETAMINOPHEN 5-325 MG PO TABS: 1.0000 | ORAL_TABLET | Freq: Four times a day (QID) | ORAL | Status: DC | PRN

## 2012-11-17 MED ORDER — LORAZEPAM 0.5 MG PO TABS: 0.5000 mg | ORAL_TABLET | Freq: Once | ORAL | Status: DC

## 2012-11-17 MED ORDER — WARFARIN SODIUM 2.5 MG PO TABS
2.5000 mg | ORAL_TABLET | Freq: Once | ORAL | Status: AC
  Administered 2012-11-17: 2.5 mg via ORAL
  Filled 2012-11-17: qty 1

## 2012-11-17 MED ORDER — SODIUM CHLORIDE 0.9 % IV SOLN
INTRAVENOUS | Status: DC
  Administered 2012-11-17 – 2012-11-19 (×4): via INTRAVENOUS

## 2012-11-17 MED ORDER — LORAZEPAM 0.5 MG PO TABS
ORAL_TABLET | ORAL | Status: AC
  Administered 2012-11-17: 0.5 mg
  Filled 2012-11-17: qty 1

## 2012-11-17 NOTE — Progress Notes (Signed)
[facility] PROGRESS NOTE  Frederick [name] ZOX:[id number] DOB: [birth year] DOA: 11/15/2012 PCP: Georgianne Fick, MD  Assessment/Plan: 1-SIRS/Sepsis: secondary to UTI -blood cx's w/o growth so far -WBC's trending down -urine cx positive for pseudomonas, sensitivity pending -will continue broad spectrum antibiotics. -fever overnight -appreciated urology assistance and rec's  2-DM type 2: -A1C 6.9 -continue SSI and lantus -will adjust as needed -continue holding glipizide while inpatient for now  3-Hx of DVT/PE: continue coumadin and lovenox bridging per pharmacy  4-HTN: soft but stable. Will decrease imdur and continue holding lasix and ARB.  5-Chronic resp failure: per patient at baseline -base on CXR findings 2-D echo ordered (preserved EF, no wall motion abnormalities and no valvular disease) -given atrial fibrillation most likely diastolic dysfunction, but compensated. -continue oxygen supplementation -strict I's and O's  6-renal insufficiency: acute and most likely due to low BP, UTI and continue use of lasix and ARB. -continue holding nephrotoxic agents -IVF's will be changed to maintenance -patient encourage to keep himself hydrated. -continue treatment for UTI  7-GERD: continue PPI  8-Bladder tumor: per urology  9-Atrial fibrillation: rate controlled. Continue metoprolol and also coumadin per pharmacy.   Code Status: Full Family Communication: wife at bedside Disposition Plan: home when medically stable   Consultants:  Urology (Dr. Margarita Grizzle)  Procedures:  See below for x-ray reports  Antibiotics:  vanc  cefepime  HPI/Subjective: Fever of 101 overnight; denies CP, SOB. Afebrile currently.  Objective: Filed Vitals:   11/17/12 1200  BP: 122/48  Pulse: 105  Temp:   Resp: 13    Intake/Output Summary (Last 24 hours) at 11/17/12 1320 Last data filed at 11/17/12 1100  Gross per 24 hour  Intake   2930 ml  Output    600 ml  Net   2330  ml   Filed Weights   11/16/12 0010 11/17/12 0500  Weight: 102.6 kg (226 lb 3.1 oz) 106.3 kg (234 lb 5.6 oz)    Exam:   General:  Afebrile this morning, NAD; feeling better  Cardiovascular: rate controlled; no rubs or gallops  Respiratory: good air movement, no wheezing  Abdomen: soft, NT, ND; slight discomfort with palpation over suprapubic area (but better according to patient); positive BS  Musculoskeletal: no edema, no cyanosis or clubbing  Data Reviewed: Basic Metabolic Panel:  Recent Labs Lab 11/15/12 1926 11/16/12 0330 11/17/12 0330  NA 138 136 138  K 3.3* 3.2* 3.7  CL 89* 93* 97  CO2 42* 39* 38*  GLUCOSE 67* 193* 141*  BUN 36* 38* 30*  CREATININE 1.29 1.43* 1.38*  CALCIUM 10.0 8.6 8.7   Liver Function Tests:  Recent Labs Lab 11/15/12 1926  AST 23  ALT 12  ALKPHOS 94  BILITOT 0.3  PROT 7.6  ALBUMIN 3.4*   CBC:  Recent Labs Lab 11/15/12 1926 11/16/12 0330 11/17/12 0330  WBC 30.6* 38.1* 28.6*  NEUTROABS 27.6* 35.5*  --   HGB 11.9* 10.2* 9.7*  HCT 37.7* 33.1* 31.9*  MCV 99.0 99.1 100.3*  PLT 342 298 289   BNP (last 3 results)  Recent Labs  11/15/12 1926  PROBNP 663.1*   CBG:  Recent Labs Lab 11/16/12 0738 11/16/12 1151 11/16/12 1641 11/16/12 2155 11/17/12 0735  GLUCAP 92 194* 175* 175* 114*    Recent Results (from the past 240 hour(s))  URINE CULTURE     Status: None   Collection Time    11/15/12  8:17 PM      Result Value Range Status   Specimen Description URINE, RANDOM  Final   Special Requests NONE   Final   Culture  Setup Time     Final   Value: 11/16/2012 06:12     Performed at [facility] Count     Final   Value: >=100,000 COLONIES/ML     Performed at [facility]   Culture     Final   Value: PSEUDOMONAS AERUGINOSA     Performed at [facility]   Report Status PENDING   Incomplete  CULTURE, BLOOD (ROUTINE X 2)     Status: None   Collection Time    11/15/12  8:28 PM       Result Value Range Status   Specimen Description BLOOD RIGHT HAND   Final   Special Requests BOTTLES DRAWN AEROBIC AND ANAEROBIC   Final   Culture  Setup Time     Final   Value: 11/16/2012 06:10     Performed at [facility]   Culture     Final   Value:        BLOOD CULTURE RECEIVED NO GROWTH TO DATE CULTURE WILL BE HELD FOR 5 DAYS BEFORE ISSUING A FINAL NEGATIVE REPORT     Performed at [facility]   Report Status PENDING   Incomplete  CULTURE, BLOOD (ROUTINE X 2)     Status: None   Collection Time    11/15/12  8:44 PM      Result Value Range Status   Specimen Description BLOOD LEFT HAND   Final   Special Requests BOTTLES DRAWN AEROBIC AND ANAEROBIC   Final   Culture  Setup Time     Final   Value: 11/16/2012 06:11     Performed at [facility]   Culture     Final   Value:        BLOOD CULTURE RECEIVED NO GROWTH TO DATE CULTURE WILL BE HELD FOR 5 DAYS BEFORE ISSUING A FINAL NEGATIVE REPORT     Performed at [facility]   Report Status PENDING   Incomplete  MRSA PCR SCREENING     Status: Abnormal   Collection Time    11/16/12 12:11 AM      Result Value Range Status   MRSA by PCR POSITIVE (*) NEGATIVE Final   Comment:            The GeneXpert MRSA Assay (FDA     approved for NASAL specimens     only), is one component of a     comprehensive MRSA colonization     surveillance program. It is not     intended to diagnose MRSA     infection nor to guide or     monitor treatment for     MRSA infections.     RESULT CALLED TO, READ BACK BY AND VERIFIED WITH:     STOCKS,M/2W @0252  ON 11/16/12 BY KARCZEWSKI,S.     Studies: Ct Abdomen Pelvis Wo Contrast  11/16/2012   *RADIOLOGY REPORT*  Clinical Data: Urinary tract infection with sepsis.  Evaluate for potential pyelonephritis.  Fever.  CT ABDOMEN AND PELVIS WITHOUT CONTRAST  Technique:  Multidetector CT imaging of the abdomen and pelvis was performed following the standard protocol without  intravenous contrast.  Comparison: CT of the abdomen and pelvis 10/14/2012.  Findings:  Lung Bases: Probable scarring in the left lung base.  Cardiomegaly. Atherosclerotic calcifications in the left main, left anterior descending, left circumflex and right coronary arteries. Calcifications  of the aortic valve.  Moderate sized hiatal hernia. Calcified pleural plaques in the posterior aspect of the left hemithorax.  Abdomen/Pelvis:  Small calcified gallstones layering dependently in the gallbladder.  No current findings to suggest acute cholecystitis at this time.  Severe pancreatic atrophy.  Status post splenectomy.  Surgical clips near the gastroesophageal junction.  The unenhanced appearance of the right kidney and bilateral adrenal glands is unremarkable.  An exophytic low attenuation lesion measuring 3.7 cm in diameter extending from the posterior aspect of the left kidney is incompletely characterized on today's noncontrast CT examination, but was previously characterized as a simple cyst.  A left-sided double J ureteral stent with the proximal loop properly reformed within the left renal pelvis and the distal loop properly reformed within the lumen of the urinary bladder.  Numerous colonic diverticula, without surrounding inflammatory changes to suggest acute diverticulitis at this time.  No significant volume of ascites.  No pneumoperitoneum.  No pathologic distension of small bowel.  No definite pathologic lymphadenopathy identified within the abdomen or pelvis.  Foley balloon catheter in place with tip in the lumen of the urinary bladder.  Small amount of gas, dependently in the lumen of the urinary bladder presumably iatrogenic. Urinary bladder wall appears thickened, however, this is likely accentuated by under distension of the urinary bladder. Small amount of stranding in the fat adjacent to the urinary bladder.  Musculoskeletal: There are no aggressive appearing lytic or blastic lesions noted in the  visualized portions of the skeleton.  IMPRESSION: 1. Thickening of the urinary bladder wall with a small amount of soft tissue stranding in the adjacent fat surrounding the urinary bladder may suggest cystitis.  Clinical correlation is recommended.  2.  No imaging findings on today's noncontrast CT examination to strongly suggest presence of pyelonephritis, however, clinical correlation and urinalysis is recommended if there is suspicion for pyelonephritis. 3.  Left-sided double J ureteral stent appears properly located. 4.  Cholelithiasis without evidence to suggest acute cholecystitis at this time. 5.  Colonic diverticulosis without findings to suggest acute diverticulitis at this time. 6.  Status post splenectomy. 7.  Moderate hiatal hernia. 8.  Atherosclerosis, including left main and three post coronary artery disease. 9.  Additional incidental findings, as above.   Original Report Authenticated By: Trudie Reed, M.D.   Dg Chest 2 View  11/15/2012   *RADIOLOGY REPORT*  Clinical Data: Chest pain.  Shortness of breath.  Weakness.  CHEST - 2 VIEW  Comparison: Chest x-ray 10/12/2012.  Findings: Lung volumes are low.  Film is under penetrated, which limits the Diagnostic sensitivity and specificity of this examination.  With these limitations in mind, there are bibasilar opacities (left greater than right), favored to represent subsegmental atelectasis.  No definite pleural effusions. Cephalization of the pulmonary vasculature, with evidence of very mild interstitial pulmonary edema.  Mild cardiomegaly. Retrocardiac density compatible with a moderate sized hiatal hernia. The patient is rotated to the left on today's exam, resulting in distortion of the mediastinal contours and reduced diagnostic sensitivity and specificity for mediastinal pathology. Atherosclerosis of the thoracic aorta. Multiple surgical clips are noted projecting over the upper abdomen.  There appears to be a left-sided double J ureteral  stent in position, however this is incompletely visualized.  IMPRESSION: 1.  Findings, as above, concerning for early congestive heart failure. 2.  Atherosclerosis. 3.  Moderate hiatal hernia. 4.  Postoperative changes, as above.   Original Report Authenticated By: Trudie Reed, M.D.    Scheduled Meds: .  antiseptic oral rinse  15 mL Mouth Rinse BID  . atorvastatin  40 mg Oral QPM  . ceFEPime (MAXIPIME) IV  1 g Intravenous Q12H  . chlordiazePOXIDE  10 mg Oral TID  . Chlorhexidine Gluconate Cloth  6 each Topical Q0600  . DULoxetine  60 mg Oral QHS  . insulin aspart  0-15 Units Subcutaneous TID WC  . insulin glargine  20 Units Subcutaneous Daily  . isosorbide mononitrate  30 mg Oral Q supper  . LORazepam  0.5 mg Oral Once  . mupirocin ointment  1 application Nasal BID  . pantoprazole  40 mg Oral Daily  . senna-docusate  1 tablet Oral BID  . sodium chloride  3 mL Intravenous Q12H  . vancomycin  1,500 mg Intravenous Q24H  . warfarin  2.5 mg Oral ONCE-1800  . Warfarin - Pharmacist Dosing Inpatient   Does not apply q1800   Continuous Infusions: . sodium chloride 1,000 mL (11/16/12 2227)    Principal Problem:   Sepsis Active Problems:   MALIGNANT NEOPLASM OF KIDNEY EXCEPT PELVIS   DIABETES MELLITUS, TYPE II, ON INSULIN   HYPERTENSION   RESPIRATORY FAILURE, CHRONIC   GERD   Pulmonary embolism   UTI (urinary tract infection)   Atrial fibrillation, permanent   Coronary artery disease, last cath 2008 occluded nondom, LCX, wth moderate ostial RCA disease, normal LV function   Warfarin anticoagulation, chronic    Time spent: >30 minutes   Frederick [name]  [facility] Pager [phone]. If 7PM-7AM, please contact night-coverage at [email or web address], password [facility] 11/17/2012, 1:20 PM  LOS: 2 days

## 2012-11-17 NOTE — Progress Notes (Signed)
Patient received as transfer from ICU.  Patient's telemetry box placed on patient and verified with central telemetry at [id number].  Agree with previous RN's assessment of patient.  Patient's wife at bedside.  Will continue to monitor.

## 2012-11-17 NOTE — Progress Notes (Signed)
Urology Progress Note  Subjective:     No acute urologic events overnight. Wife at bedside. Had fever last night. Positive UTI.   ROS: Negative: chest pain or nausea.  Objective:  Patient Vitals for the past 24 hrs:  BP Temp Temp src Pulse Resp SpO2 Weight  11/17/12 1449 120/73 mmHg 98.1 F (36.7 C) Oral 103 16 100 % -  11/17/12 1200 122/48 mmHg 98.2 F (36.8 C) Axillary 105 13 100 % -  11/17/12 0800 116/47 mmHg 99 F (37.2 C) Axillary 95 17 95 % -  11/17/12 0600 - - - 92 13 96 % -  11/17/12 0500 - - - 94 15 88 % 106.3 kg (234 lb 5.6 oz)  11/17/12 0400 91/47 mmHg 97.9 F (36.6 C) - 91 15 95 % -  11/17/12 0300 - - - 97 16 94 % -  11/17/12 0200 - - - 98 15 94 % -  11/17/12 0100 - - - 112 18 88 % -  11/17/12 0000 - 101 F (38.3 C) Oral 106 19 95 % -  11/16/12 2300 - - - 96 24 95 % -  11/16/12 2200 - - - 98 26 86 % -  11/16/12 2100 - - - 95 23 96 % -  11/16/12 2000 109/60 mmHg 100.5 F (38.1 C) Oral 95 22 83 % -  11/16/12 1900 - - - 89 23 99 % -    Physical Exam: General:  No acute distress, awake Cardiovascular:    []   S1/S2 present, RRR  [x]   Irregularly irregular Chest:  CTA-B Abdomen:               []  Soft, appropriately TTP  [x]  Soft, NTTP  []  Soft, appropriately TTP, incision(s) clean/dry/intact  Genitourinary: Foley cath in place. Negative GU edema. Foley:  Clear yellow urine.    I/O last 3 completed shifts: In: 5192.5 [I.V.:2842.5; Other:1200; IV Piggyback:1150] Out: 900 [Urine:900]  Recent Labs     11/16/12  0330  11/17/12  0330  HGB  10.2*  9.7*  WBC  38.1*  28.6*  PLT  298  289    Recent Labs     11/16/12  0330  11/17/12  0330  NA  136  138  K  3.2*  3.7  CL  93*  97  CO2  39*  38*  BUN  38*  30*  CREATININE  1.43*  1.38*  CALCIUM  8.6  8.7  GFRNONAA  44*  46*  GFRAA  51*  54*     Recent Labs     11/16/12  0330  11/17/12  0330  INR  1.63*  2.40*     No components found with this basename: ABG,     Length of stay: 2  days.  Assessment: UTI. SIRS (likely sepsis vs bacteremia).   Plan: -I don't think he is well enough to complete a voiding trial at this time. Will continue to watch and see how he does. -Continue antibiotics and await culture results from blood.   Natalia Leatherwood, MD [phone]

## 2012-11-17 NOTE — Progress Notes (Signed)
ANTICOAGULATION CONSULT NOTE - Follow Up Consult  Pharmacy Consult for Coumadin/Lovenox Indication: h/o PE, atrial fibrillation  Allergies  Allergen Reactions  . Hydromorphone Hcl Other (See Comments)    Dilaudid caused Confusion   . Morphine Other (See Comments)    confusion  . Pregabalin Other (See Comments)    lyrica - caused hallucinations   Labs:  Recent Labs  11/15/12 1926 11/15/12 2010 11/16/12 0330 11/17/12 0330  HGB 11.9*  --  10.2* 9.7*  HCT 37.7*  --  33.1* 31.9*  PLT 342  --  298 289  LABPROT  --  18.7* 18.9* 25.4*  INR  --  1.61* 1.63* 2.40*  CREATININE 1.29  --  1.43* 1.38*    Estimated Creatinine Clearance: 51.2 ml/min (by C-G formula based on Cr of 1.38).    Assessment: 64 yom with h/o PE on chronic Coumadin for Afib and hx PE. Patient was transitioned off Coumadin 7/24 and started Lovenox 40 mg sq daily 7/25 for resection of bladder tumor 7/30. Post-operatively, patient was resumed on both Coumadin and Lovenox 40 mg sq daily 7/31.  INR on 8/4 at [facility] was 1.6, patient was given Coumadin 7.5mg .  Confirmed with Virgina Evener, PharmD - patient's usual dose of Coumadin was 5mg  po daily.  Coumadin and Lovenox resumed inpatient given subtherapeutic INR on admit.   Today, INR jumped up to 2.4 given two doses of Coumadin 7.5mg  the past couple of days.  Will d/c Lovenox given therapeutic INR.  No significant drug-drug interaction noted. Hgb 9.7, plts okay, no bleeding.    Goal of Therapy:  INR 2-3 Monitor platelets by anticoagulation protocol: Yes   Plan:   D/C Lovenox and Lovenox consult given therapeutic INR  Coumadin 2.5 mg po x 1 tonight   Daily PT/INR  Pharmacy will f/u  Geoffry Paradise, PharmD, BCPS Pager: [phone] 9:04 AM Pharmacy #: [id number]

## 2012-11-18 DIAGNOSIS — C679 Malignant neoplasm of bladder, unspecified: Secondary | ICD-10-CM

## 2012-11-18 LAB — PROTIME-INR: INR: 3.73 — ABNORMAL HIGH (ref 0.00–1.49)

## 2012-11-18 LAB — BASIC METABOLIC PANEL
Calcium: 9.4 mg/dL (ref 8.4–10.5)
Creatinine, Ser: 1.11 mg/dL (ref 0.50–1.35)
GFR calc non Af Amer: 60 mL/min — ABNORMAL LOW (ref >90)
Glucose, Bld: 206 mg/dL — ABNORMAL HIGH (ref 70–99)
Sodium: 141 mEq/L (ref 135–145)

## 2012-11-18 LAB — GLUCOSE, CAPILLARY: Glucose-Capillary: 176 mg/dL — ABNORMAL HIGH (ref 70–99)

## 2012-11-18 MED ORDER — FUROSEMIDE 40 MG PO TABS
40.0000 mg | ORAL_TABLET | Freq: Every day | ORAL | Status: DC
  Administered 2012-11-18: 40 mg via ORAL
  Filled 2012-11-18 (×2): qty 1

## 2012-11-18 MED ORDER — DEXTROSE 5 % IV SOLN
2.0000 g | Freq: Two times a day (BID) | INTRAVENOUS | Status: DC
  Administered 2012-11-18 (×2): 2 g via INTRAVENOUS
  Filled 2012-11-18 (×3): qty 2

## 2012-11-18 NOTE — Progress Notes (Signed)
Frederick [name]  Frederick [name] VOZ:[id number] DOB: 1930/01/04 DOA: 11/15/2012 PCP: Georgianne Fick, MD  Assessment/Plan: 1-SIRS/Sepsis: secondary to pseudomonas UTI -blood cx's w/o growth so far -WBC's trending down -urine cx positive for pseudomonas, sensitive to cefepime -will discontinue vancomycin -no further fever -appreciated urology assistance and rec's  2-DM type 2: -A1C 6.9 -continue SSI and lantus -will adjust as needed -continue holding glipizide while inpatient  3-Hx of DVT/PE: continue coumadin per pharmacy. No more bridging needed  4-HTN: stable and with SBP in the 130's now. Will continue current dose of imdur, continue holding ARB and will restart low dose lasix.  5-Chronic resp failure: per patient at baseline -base on CXR findings 2-D echo ordered (preserved EF, no wall motion abnormalities and no valvular disease) -given atrial fibrillation most likely diastolic dysfunction, but compensated. -continue oxygen supplementation -strict I's and O's  6-renal insufficiency: acute and most likely due to low BP, UTI and continue use of lasix and ARB. -continue holding ARB -IVF's will be changed to maintenance on 8/13 -patient encourage to keep himself hydrated by mouth. -continue treatment for UTI -Cr 1.11 today. -will resume lasix.  7-GERD: continue PPI  8-Bladder tumor: per urology  9-Atrial fibrillation: rate controlled. Continue metoprolol and also coumadin per pharmacy. INR 3.73 today   Code Status: Full Family Communication: wife at bedside Disposition Plan: home when medically stable   Consultants:  Urology (Dr. Margarita Grizzle)  Procedures:  See below for x-ray reports  Antibiotics:  vanc 8/11--8/14  cefepime  HPI/Subjective: No Fever in the last 24 hours. Denies CP, SOB.   Objective: Filed Vitals:   11/18/12 1341  BP: 137/95  Pulse: 102  Temp: 97.3 F (36.3 C)  Resp: 17    Intake/Output Summary (Last 24  hours) at 11/18/12 1615 Last data filed at 11/18/12 1500  Gross per 24 hour  Intake 1260.42 ml  Output   2725 ml  Net -1464.58 ml   Filed Weights   11/16/12 0010 11/17/12 0500  Weight: 102.6 kg (226 lb 3.1 oz) 106.3 kg (234 lb 5.6 oz)    Exam:   General:  Afebrile this morning, NAD; feeling better.   Cardiovascular: rate controlled; no rubs or gallops  Respiratory: good air movement, no wheezing  Abdomen: soft, NT, ND; slight discomfort with palpation over suprapubic area (but better according to patient); positive BS  Musculoskeletal: no edema, no cyanosis or clubbing  Data Reviewed: Basic Metabolic Panel:  Recent Labs Lab 11/15/12 1926 11/16/12 0330 11/17/12 0330 11/18/12 0540  NA 138 136 138 141  K 3.3* 3.2* 3.7 3.6  CL 89* 93* 97 99  CO2 42* 39* 38* 36*  GLUCOSE 67* 193* 141* 206*  BUN 36* 38* 30* 22  CREATININE 1.29 1.43* 1.38* 1.11  CALCIUM 10.0 8.6 8.7 9.4   Liver Function Tests:  Recent Labs Lab 11/15/12 1926  AST 23  ALT 12  ALKPHOS 94  BILITOT 0.3  PROT 7.6  ALBUMIN 3.4*   CBC:  Recent Labs Lab 11/15/12 1926 11/16/12 0330 11/17/12 0330  WBC 30.6* 38.1* 28.6*  NEUTROABS 27.6* 35.5*  --   HGB 11.9* 10.2* 9.7*  HCT 37.7* 33.1* 31.9*  MCV 99.0 99.1 100.3*  PLT 342 298 289   BNP (last 3 results)  Recent Labs  11/15/12 1926  PROBNP 663.1*   CBG:  Recent Labs Lab 11/17/12 1709 11/17/12 2131 11/18/12 0349 11/18/12 0814 11/18/12 1204  GLUCAP 182* 231* 189* 176* 285*    Recent Results (from the past 240 hour(s))  URINE CULTURE     Status: None   Collection Time    11/15/12  8:17 PM      Result Value Range Status   Specimen Description URINE, RANDOM   Final   Special Requests NONE   Final   Culture  Setup Time     Final   Value: 11/16/2012 06:12     Performed at [facility] Count     Final   Value: >=100,000 COLONIES/ML     Performed at [facility]   Culture     Final   Value: PSEUDOMONAS  AERUGINOSA     Performed at [facility]   Report Status 11/17/2012 FINAL   Final   Organism ID, Bacteria PSEUDOMONAS AERUGINOSA   Final  CULTURE, BLOOD (ROUTINE X 2)     Status: None   Collection Time    11/15/12  8:28 PM      Result Value Range Status   Specimen Description BLOOD RIGHT HAND   Final   Special Requests BOTTLES DRAWN AEROBIC AND ANAEROBIC   Final   Culture  Setup Time     Final   Value: 11/16/2012 06:10     Performed at [facility]   Culture     Final   Value:        BLOOD CULTURE RECEIVED NO GROWTH TO DATE CULTURE WILL BE HELD FOR 5 DAYS BEFORE ISSUING A FINAL NEGATIVE REPORT     Performed at [facility]   Report Status PENDING   Incomplete  CULTURE, BLOOD (ROUTINE X 2)     Status: None   Collection Time    11/15/12  8:44 PM      Result Value Range Status   Specimen Description BLOOD LEFT HAND   Final   Special Requests BOTTLES DRAWN AEROBIC AND ANAEROBIC   Final   Culture  Setup Time     Final   Value: 11/16/2012 06:11     Performed at [facility]   Culture     Final   Value:        BLOOD CULTURE RECEIVED NO GROWTH TO DATE CULTURE WILL BE HELD FOR 5 DAYS BEFORE ISSUING A FINAL NEGATIVE REPORT     Performed at [facility]   Report Status PENDING   Incomplete  MRSA PCR SCREENING     Status: Abnormal   Collection Time    11/16/12 12:11 AM      Result Value Range Status   MRSA by PCR POSITIVE (*) NEGATIVE Final   Comment:            The GeneXpert MRSA Assay (FDA     approved for NASAL specimens     only), is one component of a     comprehensive MRSA colonization     surveillance program. It is not     intended to diagnose MRSA     infection nor to guide or     monitor treatment for     MRSA infections.     RESULT CALLED TO, READ BACK BY AND VERIFIED WITH:     STOCKS,M/2W @0252  ON 11/16/12 BY KARCZEWSKI,S.     Studies: No results found.  Scheduled Meds: . antiseptic oral rinse  15 mL Mouth Rinse BID   . atorvastatin  40 mg Oral QPM  . ceFEPime (MAXIPIME) IV  2 g Intravenous Q12H  . chlordiazePOXIDE  10 mg Oral TID  . Chlorhexidine  Gluconate Cloth  6 each Topical [id number]  . DULoxetine  60 mg Oral QHS  . insulin aspart  0-15 Units Subcutaneous TID WC  . insulin glargine  20 Units Subcutaneous Daily  . isosorbide mononitrate  30 mg Oral Q supper  . LORazepam  0.5 mg Oral Once  . mupirocin ointment  1 application Nasal BID  . pantoprazole  40 mg Oral Daily  . senna-docusate  1 tablet Oral BID  . sodium chloride  3 mL Intravenous Q12H  . Warfarin - Pharmacist Dosing Inpatient   Does not apply q1800   Continuous Infusions: . sodium chloride 50 mL/hr at 11/18/12 1108    Principal Problem:   Sepsis Active Problems:   MALIGNANT NEOPLASM OF KIDNEY EXCEPT PELVIS   DIABETES MELLITUS, TYPE II, ON INSULIN   HYPERTENSION   RESPIRATORY FAILURE, CHRONIC   GERD   Pulmonary embolism   UTI (urinary tract infection)   Atrial fibrillation, permanent   Coronary artery disease, last cath 2008 occluded nondom, LCX, wth moderate ostial RCA disease, normal LV function   Warfarin anticoagulation, chronic    Time spent: >30 minutes   Lanell Dubie  [facility] Pager [phone]. If 7PM-7AM, please contact night-coverage at [email or web address], password [facility] 11/18/2012, 4:15 PM  LOS: 3 days

## 2012-11-18 NOTE — Progress Notes (Signed)
ANTICOAGULATION CONSULT NOTE - Follow Up Consult  Pharmacy Consult for Coumadin/Lovenox Indication: h/o PE, atrial fibrillation  Allergies  Allergen Reactions  . Hydromorphone Hcl Other (See Comments)    Dilaudid caused Confusion   . Morphine Other (See Comments)    confusion  . Pregabalin Other (See Comments)    lyrica - caused hallucinations   Labs:  Recent Labs  11/15/12 1926  11/16/12 0330 11/17/12 0330 11/18/12 0540  HGB 11.9*  --  10.2* 9.7*  --   HCT 37.7*  --  33.1* 31.9*  --   PLT 342  --  298 289  --   LABPROT  --   < > 18.9* 25.4* 35.5*  INR  --   < > 1.63* 2.40* 3.73*  CREATININE 1.29  --  1.43* 1.38* 1.11  < > = values in this interval not displayed.  Estimated Creatinine Clearance: 63.7 ml/min (by C-G formula based on Cr of 1.11).    Assessment: 57 yom with h/o PE on chronic Coumadin for Afib and hx PE. Patient was transitioned off Coumadin 7/24 and started Lovenox 40 mg sq daily 7/25 for resection of bladder tumor 7/30. Post-operatively, patient was resumed on both Coumadin and Lovenox 40 mg sq daily 7/31.  INR on 8/4 at [facility] was 1.6, patient was given Coumadin 7.5mg .  Confirmed with Virgina Evener, PharmD - patient's usual dose of Coumadin was 5mg  po daily.  Coumadin and Lovenox resumed inpatient given subtherapeutic INR on admit.   INR now supratherpeutic and rising after boosted doses and limited po intake  No CBC today, no visible bleeding reported  No drug interactions noted  Goal of Therapy:  INR 2-3 Monitor platelets by anticoagulation protocol: Yes   Plan:   Hold warfarin tonight  Daily PT/INR  Loralee Pacas, PharmD, BCPS Pager: [phone] 11/18/2012 7:48 AM

## 2012-11-18 NOTE — Progress Notes (Signed)
Urology Progress Note  Subjective:     No acute urologic events overnight. Wife not at bedside. No fever last night. Positive UTI.   ROS: Negative: chest pain or nausea.  Objective:  Patient Vitals for the past 24 hrs:  BP Temp Temp src Pulse Resp SpO2  11/18/12 0500 134/79 mmHg 98.6 F (37 C) Oral 95 16 95 %  11/17/12 2132 118/66 mmHg 98.1 F (36.7 C) Oral 102 16 97 %  11/17/12 1449 120/73 mmHg 98.1 F (36.7 C) Oral 103 16 100 %  11/17/12 1200 122/48 mmHg 98.2 F (36.8 C) Axillary 105 13 100 %    Physical Exam: General:  No acute distress, awake Cardiovascular:    []   S1/S2 present, RRR  [x]   Irregularly irregular Chest:  CTA-B Abdomen:               []  Soft, appropriately TTP  [x]  Soft, NTTP  []  Soft, appropriately TTP, incision(s) clean/dry/intact  Genitourinary: Foley cath in place. Negative GU edema. Foley:  Clear yellow urine.    I/O last 3 completed shifts: In: 2987.9 [P.O.:680; I.V.:2207.9; IV Piggyback:100] Out: 2950 [Urine:2950]  Recent Labs     11/16/12  0330  11/17/12  0330  HGB  10.2*  9.7*  WBC  38.1*  28.6*  PLT  298  289    Recent Labs     11/17/12  0330  11/18/12  0540  NA  138  141  K  3.7  3.6  CL  97  99  CO2  38*  36*  BUN  30*  22  CREATININE  1.38*  1.11  CALCIUM  8.7  9.4  GFRNONAA  46*  60*  GFRAA  54*  70*     Recent Labs     11/17/12  0330  11/18/12  0540  INR  2.40*  3.73*     No components found with this basename: ABG,     Length of stay: 3 days.  Assessment: UTI. SIRS (likely sepsis vs bacteremia).   Plan: -I still don't think he is well enough to complete a voiding trial at this time.   -If he recovers significantly while in the hospital, consider voiding trial. If not, have him f/u in my clinic in 1 week for voiding trial. I will be out of town for the next week beginning tomorrow. Please call urologist on call if any problems arise. I will ask my office to schedule f/u for voiding trial with a NP in  1 week.  -Continue antibiotics and await culture results from blood.   Natalia Leatherwood, MD [phone]

## 2012-11-18 NOTE — Progress Notes (Signed)
ANTIBIOTIC CONSULT NOTE - FOLLOW UP  Pharmacy Consult for Vancomycin and Cefepime Indication: SIRS/Sepsis, Pseudomonas UTI  Allergies  Allergen Reactions  . Hydromorphone Hcl Other (See Comments)    Dilaudid caused Confusion   . Morphine Other (See Comments)    confusion  . Pregabalin Other (See Comments)    lyrica - caused hallucinations    Patient Measurements: Height: 5\' 10"  (177.8 cm) Weight: 234 lb 5.6 oz (106.3 kg) IBW/kg (Calculated) : 73  Vital Signs: Temp: 98.6 F (37 C) (08/14 0500) Temp src: Oral (08/14 0500) BP: 134/79 mmHg (08/14 0500) Pulse Rate: 95 (08/14 0500) Intake/Output from previous day: 08/13 0701 - 08/14 0700 In: 2037.9 [P.O.:680; I.V.:1307.9; IV Piggyback:50] Out: 2350 [Urine:2350] Intake/Output from this shift:    Labs:  Recent Labs  11/15/12 1926 11/16/12 0330 11/17/12 0330 11/18/12 0540  WBC 30.6* 38.1* 28.6*  --   HGB 11.9* 10.2* 9.7*  --   PLT 342 298 289  --   CREATININE 1.29 1.43* 1.38* 1.11   Estimated Creatinine Clearance: 63.7 ml/min (by C-G formula based on Cr of 1.11). No results found for this basename: VANCOTROUGH, Leodis Binet, VANCORANDOM, GENTTROUGH, GENTPEAK, GENTRANDOM, TOBRATROUGH, TOBRAPEAK, TOBRARND, AMIKACINPEAK, AMIKACINTROU, AMIKACIN,  in the last 72 hours   Microbiology: Recent Results (from the past 720 hour(s))  URINE CULTURE     Status: None   Collection Time    11/15/12  8:17 PM      Result Value Range Status   Specimen Description URINE, RANDOM   Final   Special Requests NONE   Final   Culture  Setup Time     Final   Value: 11/16/2012 06:12     Performed at [facility] Count     Final   Value: >=100,000 COLONIES/ML     Performed at [facility]   Culture     Final   Value: PSEUDOMONAS AERUGINOSA     Performed at [facility]   Report Status 11/17/2012 FINAL   Final   Organism ID, Bacteria PSEUDOMONAS AERUGINOSA   Final  CULTURE, BLOOD (ROUTINE X 2)     Status:  None   Collection Time    11/15/12  8:28 PM      Result Value Range Status   Specimen Description BLOOD RIGHT HAND   Final   Special Requests BOTTLES DRAWN AEROBIC AND ANAEROBIC   Final   Culture  Setup Time     Final   Value: 11/16/2012 06:10     Performed at [facility]   Culture     Final   Value:        BLOOD CULTURE RECEIVED NO GROWTH TO DATE CULTURE WILL BE HELD FOR 5 DAYS BEFORE ISSUING A FINAL NEGATIVE REPORT     Performed at [facility]   Report Status PENDING   Incomplete  CULTURE, BLOOD (ROUTINE X 2)     Status: None   Collection Time    11/15/12  8:44 PM      Result Value Range Status   Specimen Description BLOOD LEFT HAND   Final   Special Requests BOTTLES DRAWN AEROBIC AND ANAEROBIC   Final   Culture  Setup Time     Final   Value: 11/16/2012 06:11     Performed at [facility]   Culture     Final   Value:        BLOOD CULTURE RECEIVED NO GROWTH TO DATE CULTURE WILL  BE HELD FOR 5 DAYS BEFORE ISSUING A FINAL NEGATIVE REPORT     Performed at [facility]   Report Status PENDING   Incomplete  MRSA PCR SCREENING     Status: Abnormal   Collection Time    11/16/12 12:11 AM      Result Value Range Status   MRSA by PCR POSITIVE (*) NEGATIVE Final   Comment:            The GeneXpert MRSA Assay (FDA     approved for NASAL specimens     only), is one component of a     comprehensive MRSA colonization     surveillance program. It is not     intended to diagnose MRSA     infection nor to guide or     monitor treatment for     MRSA infections.     RESULT CALLED TO, READ BACK BY AND VERIFIED WITH:     STOCKS,M/2W @0252  ON 11/16/12 BY KARCZEWSKI,S.    Anti-Infectives: PTA >> Keflex 8/11 Rocephin x1 8/12 >> Vanc >> 8/12 >> Cefepime >>  Assessment: 83 yom s/p bladder tumor resection & and L ureteral stent 11/03/12 by Urology. Patient admitted 8/11 with sepsis (fever, tachy, hypotension, leukocytosis. Started on broad spectrum  antibiotics as above.  Urine culture growing Pseudomonas - sensitive to all agents tested including cefepime  SCr improving daily, current CrCl 63 ml/min  WBC elevated but improved on 8/13  Afebrile since 8/12  Can vancomycin be dc'd since >48hr with no MRSA isolated?   Goal of Therapy:  Vancomycin trough level 15-20 mcg/ml  Cefepime dose for renal function  Plan:   Check vancomycin trough tonight if continues  Increase cefepime to 2g IV q12h given improved renal function, obesity and +cultures  Loralee Pacas, PharmD, BCPS Pager: [phone] 11/18/2012,8:06 AM

## 2012-11-19 LAB — BASIC METABOLIC PANEL
CO2: 41 mEq/L (ref 19–32)
Calcium: 9.7 mg/dL (ref 8.4–10.5)
Creatinine, Ser: 1.26 mg/dL (ref 0.50–1.35)
Glucose, Bld: 192 mg/dL — ABNORMAL HIGH (ref 70–99)

## 2012-11-19 LAB — CBC
Hemoglobin: 9.9 g/dL — ABNORMAL LOW (ref 13.0–17.0)
MCH: 30.1 pg (ref 26.0–34.0)
MCV: 100.3 fL — ABNORMAL HIGH (ref 78.0–100.0)
RBC: 3.29 MIL/uL — ABNORMAL LOW (ref 4.22–5.81)

## 2012-11-19 LAB — GLUCOSE, CAPILLARY: Glucose-Capillary: 195 mg/dL — ABNORMAL HIGH (ref 70–99)

## 2012-11-19 MED ORDER — WARFARIN 0.5 MG HALF TABLET
0.5000 mg | ORAL_TABLET | Freq: Once | ORAL | Status: AC
  Administered 2012-11-19: 0.5 mg via ORAL
  Filled 2012-11-19: qty 1

## 2012-11-19 MED ORDER — HYDROCODONE-ACETAMINOPHEN 5-325 MG PO TABS: 1.0000 | ORAL_TABLET | Freq: Four times a day (QID) | ORAL | Status: DC | PRN

## 2012-11-19 MED ORDER — POTASSIUM CHLORIDE CRYS ER 20 MEQ PO TBCR
40.0000 meq | EXTENDED_RELEASE_TABLET | ORAL | Status: AC
  Administered 2012-11-19 (×2): 40 meq via ORAL
  Filled 2012-11-19 (×3): qty 2

## 2012-11-19 MED ORDER — CIPROFLOXACIN HCL 500 MG PO TABS
500.0000 mg | ORAL_TABLET | Freq: Two times a day (BID) | ORAL | Status: DC
  Administered 2012-11-19 – 2012-11-20 (×3): 500 mg via ORAL
  Filled 2012-11-19 (×5): qty 1

## 2012-11-19 NOTE — Progress Notes (Signed)
Inpatient Diabetes Program Recommendations  AACE/ADA: New Consensus Statement on Inpatient Glycemic Control (2013)  Target Ranges:  Prepandial:   less than 140 mg/dL      Peak postprandial:   less than 180 mg/dL (1-2 hours)      Critically ill patients:  140 - 180 mg/dL   Reason for Visit: Hyperglycemia  Results for Frederick [name], Frederick [name] (MRN [id number]) as of 11/19/2012 18:39  Ref. Range 11/18/2012 12:04 11/18/2012 16:54 11/19/2012 07:42 11/19/2012 11:54 11/19/2012 16:49  Glucose-Capillary Latest Range: 70-99 mg/dL 366 (H) 440 (H) 347 (H) 177 (H) 174 (H)   Hyperglycemia improving.  May consider increasing Novolog correction to resistant tidwc.

## 2012-11-19 NOTE — Progress Notes (Signed)
CRITICAL VALUE ALERT  Critical value received:  CO2 41  Date of notification:  11/19/2012   Time of notification:  0628 AM  Critical value read back:yes  Nurse who received alert:  Aviva Wolfer, Estanislado Emms RN  MD notified (1st page):  Craige Cotta, NP  Time of first page:  0628 AM  MD notified (2nd page):  Time of second page: 0636 AM  Responding MD:  Craige Cotta, NP  Time MD responded:  [phone] AM

## 2012-11-19 NOTE — Progress Notes (Signed)
Chart reviewed. Spoke with pt's wife at bedside. She will take pt home with Home Health. Pt is active with [place] and will continue with [facility] at discharge. There are no other needs at present time/pt's wife.

## 2012-11-19 NOTE — Progress Notes (Signed)
ANTICOAGULATION CONSULT NOTE - Follow Up Consult  Pharmacy Consult for Coumadin Indication: h/o PE, atrial fibrillation  Allergies  Allergen Reactions  . Hydromorphone Hcl Other (See Comments)    Dilaudid caused Confusion   . Morphine Other (See Comments)    confusion  . Pregabalin Other (See Comments)    lyrica - caused hallucinations   Labs:  Recent Labs  11/17/12 0330 11/18/12 0540 11/19/12 0500  HGB 9.7*  --  9.9*  HCT 31.9*  --  33.0*  PLT 289  --  354  LABPROT 25.4* 35.5* 30.4*  INR 2.40* 3.73* 3.04*  CREATININE 1.38* 1.11 1.26    Estimated Creatinine Clearance: 56.1 ml/min (by C-G formula based on Cr of 1.26).    Assessment: 34 yom with h/o PE on chronic Coumadin for Afib and hx PE. Patient was transitioned off Coumadin 7/24 and started Lovenox 40 mg sq daily 7/25 for resection of bladder tumor 7/30. Post-operatively, patient was resumed on both Coumadin and Lovenox 40 mg sq daily 7/31.  INR on 8/4 at [facility] was 1.6, patient was given Coumadin 7.5mg .  Confirmed with Virgina Evener, PharmD - patient's usual dose of Coumadin was 5mg  po daily.  Coumadin and Lovenox resumed inpatient given subtherapeutic INR on admit.   INR essentially therapeutic - supratherpeutic yesterday after boosted doses and limited po intake  CBC stable, no visible bleeding reported  No drug interactions noted, po intake improving  Goal of Therapy:  INR 2-3 Monitor platelets by anticoagulation protocol: Yes   Plan:   Resume small dose warfarin tonight - 0.5mg   Daily PT/INR  Loralee Pacas, PharmD, BCPS Pager: [phone] 11/19/2012 8:03 AM

## 2012-11-19 NOTE — Progress Notes (Signed)
[facility] PROGRESS NOTE  Frederick [name] ZOX:[id number] DOB: 04/01/1930 DOA: 11/15/2012 PCP: Georgianne Fick, MD  Assessment/Plan: 1-SIRS/Sepsis: secondary to pseudomonas UTI -blood cx's w/o growth so far -WBC's trending down -urine cx positive for pseudomonas, sensitive to cefepime and ciprofloxacin -now that patient is doing better and tolerating well PO/afebrile will transition to cipro and monitor. If remains stable will discharge home in am. -will discontinue vancomycin -no further fever -appreciated urology assistance and rec's  2-DM type 2: -A1C 6.9 -continue SSI and lantus -will adjust as needed -continue holding glipizide while inpatient  3-Hx of DVT/PE: continue coumadin per pharmacy. No more bridging needed  4-HTN: stable and with SBP in the 130's now. Will continue current dose of imdur, continue holding ARB and lasix.  5-Chronic resp failure: per patient at baseline -base on CXR findings 2-D echo ordered (preserved EF, no wall motion abnormalities and no valvular disease) -given atrial fibrillation most likely diastolic dysfunction, but compensated. -continue oxygen supplementation -strict I's and O's  6-renal insufficiency: acute and most likely due to low BP, UTI and continue use of lasix and ARB. -continue holding ARB -IVF's will be changed to maintenance on 8/13 -patient encourage to keep himself hydrated by mouth. -continue treatment for UTI -Cr 1.2 today. -will hold lasix; as Cr increase some and his Co2 is also up. To be resume later as an outpatient.  7-GERD: continue PPI  8-Bladder tumor/urinary retention: per urology. Will leave foley in place and he will follow as an outpatient with   9-Atrial fibrillation: rate controlled. Continue metoprolol and also coumadin per pharmacy. INR 3.04 today   Code Status: Full Family Communication: wife at bedside Disposition Plan: home when medically stable   Consultants:  Urology (Dr.  Margarita Grizzle)  Procedures:  See below for x-ray reports  Antibiotics:  vanc 8/11--8/14  cefepime  HPI/Subjective: Sleepy this morning, but otherwise feeling better. Remains afebrile and without CP or SOB.  Objective: Filed Vitals:   11/19/12 0618  BP: 146/99  Pulse: 97  Temp: 98.1 F (36.7 C)  Resp: 18    Intake/Output Summary (Last 24 hours) at 11/19/12 1058 Last data filed at 11/19/12 0900  Gross per 24 hour  Intake   1760 ml  Output   3645 ml  Net  -1885 ml   Filed Weights   11/16/12 0010 11/17/12 0500  Weight: 102.6 kg (226 lb 3.1 oz) 106.3 kg (234 lb 5.6 oz)    Exam:   General:  Afebrile this morning, NAD; feeling better overall.   Cardiovascular: rate controlled; no rubs or gallops  Respiratory: good air movement, no wheezing  Abdomen: soft, NT, ND; slight discomfort with palpation over suprapubic area (but better according to patient); positive BS  Musculoskeletal: no edema, no cyanosis or clubbing  Data Reviewed: Basic Metabolic Panel:  Recent Labs Lab 11/15/12 1926 11/16/12 0330 11/17/12 0330 11/18/12 0540 11/19/12 0500  NA 138 136 138 141 144  K 3.3* 3.2* 3.7 3.6 2.8*  CL 89* 93* 97 99 98  CO2 42* 39* 38* 36* 41*  GLUCOSE 67* 193* 141* 206* 192*  BUN 36* 38* 30* 22 18  CREATININE 1.29 1.43* 1.38* 1.11 1.26  CALCIUM 10.0 8.6 8.7 9.4 9.7   Liver Function Tests:  Recent Labs Lab 11/15/12 1926  AST 23  ALT 12  ALKPHOS 94  BILITOT 0.3  PROT 7.6  ALBUMIN 3.4*   CBC:  Recent Labs Lab 11/15/12 1926 11/16/12 0330 11/17/12 0330 11/19/12 0500  WBC 30.6* 38.1* 28.6* 14.8*  NEUTROABS  27.6* 35.5*  --   --   HGB 11.9* 10.2* 9.7* 9.9*  HCT 37.7* 33.1* 31.9* 33.0*  MCV 99.0 99.1 100.3* 100.3*  PLT 342 298 289 354   BNP (last 3 results)  Recent Labs  11/15/12 1926  PROBNP 663.1*   CBG:  Recent Labs Lab 11/18/12 0349 11/18/12 0814 11/18/12 1204 11/18/12 1654 11/19/12 0742  GLUCAP 189* 176* 285* 203* 167*    Recent  Results (from the past 240 hour(s))  URINE CULTURE     Status: None   Collection Time    11/15/12  8:17 PM      Result Value Range Status   Specimen Description URINE, RANDOM   Final   Special Requests NONE   Final   Culture  Setup Time     Final   Value: 11/16/2012 06:12     Performed at [facility] Count     Final   Value: >=100,000 COLONIES/ML     Performed at [facility]   Culture     Final   Value: PSEUDOMONAS AERUGINOSA     Performed at [facility]   Report Status 11/17/2012 FINAL   Final   Organism ID, Bacteria PSEUDOMONAS AERUGINOSA   Final  CULTURE, BLOOD (ROUTINE X 2)     Status: None   Collection Time    11/15/12  8:28 PM      Result Value Range Status   Specimen Description BLOOD RIGHT HAND   Final   Special Requests BOTTLES DRAWN AEROBIC AND ANAEROBIC   Final   Culture  Setup Time     Final   Value: 11/16/2012 06:10     Performed at [facility]   Culture     Final   Value:        BLOOD CULTURE RECEIVED NO GROWTH TO DATE CULTURE WILL BE HELD FOR 5 DAYS BEFORE ISSUING A FINAL NEGATIVE REPORT     Performed at [facility]   Report Status PENDING   Incomplete  CULTURE, BLOOD (ROUTINE X 2)     Status: None   Collection Time    11/15/12  8:44 PM      Result Value Range Status   Specimen Description BLOOD LEFT HAND   Final   Special Requests BOTTLES DRAWN AEROBIC AND ANAEROBIC   Final   Culture  Setup Time     Final   Value: 11/16/2012 06:11     Performed at [facility]   Culture     Final   Value:        BLOOD CULTURE RECEIVED NO GROWTH TO DATE CULTURE WILL BE HELD FOR 5 DAYS BEFORE ISSUING A FINAL NEGATIVE REPORT     Performed at [facility]   Report Status PENDING   Incomplete  MRSA PCR SCREENING     Status: Abnormal   Collection Time    11/16/12 12:11 AM      Result Value Range Status   MRSA by PCR POSITIVE (*) NEGATIVE Final   Comment:            The GeneXpert MRSA Assay (FDA      approved for NASAL specimens     only), is one component of a     comprehensive MRSA colonization     surveillance program. It is not     intended to diagnose MRSA     infection nor to guide or  monitor treatment for     MRSA infections.     RESULT CALLED TO, READ BACK BY AND VERIFIED WITH:     STOCKS,M/2W @0252  ON 11/16/12 BY KARCZEWSKI,S.     Studies: No results found.  Scheduled Meds: . antiseptic oral rinse  15 mL Mouth Rinse BID  . atorvastatin  40 mg Oral QPM  . chlordiazePOXIDE  10 mg Oral TID  . Chlorhexidine Gluconate Cloth  6 each Topical Q0600  . ciprofloxacin  500 mg Oral BID  . DULoxetine  60 mg Oral QHS  . insulin aspart  0-15 Units Subcutaneous TID WC  . insulin glargine  20 Units Subcutaneous Daily  . isosorbide mononitrate  30 mg Oral Q supper  . LORazepam  0.5 mg Oral Once  . mupirocin ointment  1 application Nasal BID  . pantoprazole  40 mg Oral Daily  . potassium chloride  40 mEq Oral Q4H  . senna-docusate  1 tablet Oral BID  . sodium chloride  3 mL Intravenous Q12H  . warfarin  0.5 mg Oral ONCE-1800  . Warfarin - Pharmacist Dosing Inpatient   Does not apply q1800   Continuous Infusions: . sodium chloride 50 mL/hr at 11/19/12 0849    Principal Problem:   Sepsis Active Problems:   MALIGNANT NEOPLASM OF KIDNEY EXCEPT PELVIS   DIABETES MELLITUS, TYPE II, ON INSULIN   HYPERTENSION   RESPIRATORY FAILURE, CHRONIC   GERD   Pulmonary embolism   UTI (urinary tract infection)   Atrial fibrillation, permanent   Coronary artery disease, last cath 2008 occluded nondom, LCX, wth moderate ostial RCA disease, normal LV function   Warfarin anticoagulation, chronic    Time spent: >30 minutes   Armstrong Creasy  [facility] Pager [phone]. If 7PM-7AM, please contact night-coverage at [email or web address], password [facility] 11/19/2012, 10:58 AM  LOS: 4 days

## 2012-11-19 NOTE — Evaluation (Signed)
Physical Therapy Evaluation Patient Details Name: Frederick [name] MRN: [id number] DOB: July 21, 1929 Today's Date: 11/19/2012 Time: 4098-1191 PT Time Calculation (min): 21 min  PT Assessment / Plan / Recommendation History of Present Illness  79 year old obese  male with a history of diabetes mellitus on insulin, hypertension, coronary artery disease, A. fib and history of PE on chronic Coumadin, GERD, history of peripheral vascular disease, depression, hard of hearing, history of pancreatic tumor status post resection and his recent transurethral bladder tumor resection and left ureteral stent placed by Dr Margarita Grizzle on 11/03/2012 and discharged home with a foley was brought to the ED by his wife after she noticed him to have sudden onset chills and shaking this off and on. She quoted his temperature and was about 100F. Patient also complained of some infraumbilical pain and appeared confused. Patient's wife called Dr Hilario Quarry office and was instructed to work with the ED.  Clinical Impression  On eval, pt required +2 assist for mobility-able to perform stand pivot bed>chair with increased time/assist/cueing from wife/therapist. Pt was very lethargic/drowsy during session. Discussed d/c plan with pt/wife-explained pt may need to consider ST rehab at SNF if mobility level does not progress well enough for wife to manage with pt at home. Both pt/wife hesitant to agree to SNF placement.     PT Assessment  Patient needs continued PT services    Follow Up Recommendations  SNF (unless mobility progresses significantly to +1 assist for all tasks so wife can manage at home. However, pt/wife not agreeable to SNF at time of eval.)   Does the patient have the potential to tolerate intense rehabilitation      Barriers to Discharge        Equipment Recommendations  None recommended by PT  Recommendations for Other Services OT consult   Frequency Min 3X/week    Precautions / Restrictions  Precautions Precautions: Fall Restrictions Weight Bearing Restrictions: No Other Position/Activity Restrictions: limited mobility at baseline-stand pivot transfers only (due to chronic R hip issues)   Pertinent Vitals/Pain Facial grimacing with movement of R LE at times      Mobility  Bed Mobility Bed Mobility: Supine to Sit Supine to Sit: HOB elevated;1: +2 Total assist Supine to Sit: Patient Percentage: 20% Details for Bed Mobility Assistance: Pt very drowsy-having difficulty keeping eyes open. assist for bil LEs off bed and trunk to upright. Utilized bedpad for scooting, positioning Transfers Transfers: Sit to Stand;Stand to [employer] Transfers Sit to Stand: 1: +2 Total assist;From bed;From elevated surface Sit to Stand: Patient Percentage: 50% Stand to Sit: 1: +2 Total assist;To chair/3-in-1 Stand to Sit: Patient Percentage: 50% Stand Pivot Transfers: 1: +2 Total assist Stand Pivot Transfers: Patient Percentage: 50% Details for Transfer Assistance: Increased time to complet all tasks. Multimodal cues for hand placement, technique, sequencing. Difficulty moving R LE noted-external assist needed. Assist to stabilize pt and maneuver with RW Ambulation/Gait Ambulation/Gait Assistance: Not tested (comment) Ambulation/Gait Assistance Details: Pt non ambulatory    Exercises General Exercises - Lower Extremity Long Arc Quad: AROM;Left;Right;10 reps;AAROM;Seated (AA RLE)   PT Diagnosis: Difficulty walking;Generalized weakness  PT Problem List: Decreased strength;Decreased activity tolerance;Decreased balance;Decreased mobility;Decreased knowledge of use of DME;Obesity PT Treatment Interventions: DME instruction;Functional mobility training;Therapeutic activities;Therapeutic exercise;Patient/family education;Balance training     PT Goals(Current goals can be found in the care plan section) Acute Rehab PT Goals Patient Stated Goal: home PT Goal Formulation: With  patient/family Time For Goal Achievement: 12/03/12 Potential to Achieve Goals: Fair  Visit Information  Last PT Received On: 11/19/12 Assistance Needed: +2 History of Present Illness: 79 year old obese  male with a history of diabetes mellitus on insulin, hypertension, coronary artery disease, A. fib and history of PE on chronic Coumadin, GERD, history of peripheral vascular disease, depression, hard of hearing, history of pancreatic tumor status post resection and his recent transurethral bladder tumor resection and left ureteral stent placed by Dr Margarita Grizzle on 11/03/2012 and discharged home with a foley was brought to the ED by his wife after she noticed him to have sudden onset chills and shaking this off and on. She quoted his temperature and was about 100F. Patient also complained of some infraumbilical pain and appeared confused. Patient's wife called Dr Hilario Quarry office and was instructed to work with the ED.       Prior Functioning  Home Living Family/patient expects to be discharged to:: Private residence Living Arrangements: Spouse/significant other Available Help at Discharge: Family Type of Home: House Home Layout: One level Home Equipment: Environmental consultant - 2 wheels;Wheelchair - manual Prior Function Level of Independence: Needs assistance Gait / Transfers Assistance Needed: +1 assist with RW-stand pivot transfers Communication Communication: HOH    Cognition  Cognition Arousal/Alertness: Lethargic Behavior During Therapy: Flat affect Overall Cognitive Status: History of cognitive impairments - at baseline    Extremity/Trunk Assessment Upper Extremity Assessment Upper Extremity Assessment: Generalized weakness Lower Extremity Assessment Lower Extremity Assessment: LLE deficits/detail;RLE deficits/detail RLE Deficits / Details: unable to test R hip due to discomfort with movement. R knee ext 3-/5 LLE Deficits / Details: Strength at least 3+/5 throughout Cervical / Trunk  Assessment Cervical / Trunk Assessment: Kyphotic   Balance    End of Session PT - End of Session Activity Tolerance: Patient limited by fatigue;Patient limited by lethargy Patient left: in chair;with call bell/phone within reach;with family/visitor present Nurse Communication: Mobility status (written on white board)  GP     Rebeca Alert, MPT Pager: [phone]

## 2012-11-20 DIAGNOSIS — R5381 Other malaise: Secondary | ICD-10-CM

## 2012-11-20 LAB — BASIC METABOLIC PANEL
Chloride: 98 mEq/L (ref 96–112)
GFR calc Af Amer: 68 mL/min — ABNORMAL LOW (ref >90)
Potassium: 3.6 mEq/L (ref 3.5–5.1)
Sodium: 144 mEq/L (ref 135–145)

## 2012-11-20 LAB — PROTIME-INR
INR: 2.55 — ABNORMAL HIGH (ref 0.00–1.49)
Prothrombin Time: 26.6 seconds — ABNORMAL HIGH (ref 11.6–15.2)

## 2012-11-20 LAB — GLUCOSE, CAPILLARY: Glucose-Capillary: 206 mg/dL — ABNORMAL HIGH (ref 70–99)

## 2012-11-20 MED ORDER — IRBESARTAN 150 MG PO TABS: 150.0000 mg | ORAL_TABLET | Freq: Every day | ORAL | Status: DC

## 2012-11-20 MED ORDER — POTASSIUM CHLORIDE CRYS ER 20 MEQ PO TBCR: 20.0000 meq | EXTENDED_RELEASE_TABLET | Freq: Every day | ORAL | Status: DC

## 2012-11-20 MED ORDER — METOLAZONE 5 MG PO TABS: 7.5000 mg | ORAL_TABLET | Freq: Every day | ORAL | Status: DC

## 2012-11-20 MED ORDER — WARFARIN SODIUM 2.5 MG PO TABS
2.5000 mg | ORAL_TABLET | Freq: Once | ORAL | Status: DC
  Filled 2012-11-20: qty 1

## 2012-11-20 MED ORDER — HYDROCODONE-ACETAMINOPHEN 5-325 MG PO TABS: 1.0000 | ORAL_TABLET | ORAL | Status: DC | PRN

## 2012-11-20 MED ORDER — CIPROFLOXACIN HCL 500 MG PO TABS: 500.0000 mg | ORAL_TABLET | Freq: Two times a day (BID) | ORAL | Status: AC

## 2012-11-20 MED ORDER — FUROSEMIDE 40 MG PO TABS: 40.0000 mg | ORAL_TABLET | Freq: Two times a day (BID) | ORAL | Status: DC

## 2012-11-20 NOTE — Progress Notes (Signed)
11/20/2012 1310 NCM spoke to [place] rep and made aware of orders for [facility]. Isidoro Donning RN CCM Case Mgmt phone [phone]

## 2012-11-20 NOTE — Progress Notes (Signed)
Physical Therapy Treatment Patient Details Name: Frederick [name] MRN: [id number] DOB: [birth year] Today's Date: 11/20/2012 Time: 8295-6213 PT Time Calculation (min): 20 min  PT Assessment / Plan / Recommendation  History of Present Illness 79 year old obese  male with a history of diabetes mellitus on insulin, hypertension, coronary artery disease, A. fib and history of PE on chronic Coumadin, GERD, history of peripheral vascular disease, depression, hard of hearing, history of pancreatic tumor status post resection and his recent transurethral bladder tumor resection and left ureteral stent placed by Dr Margarita Grizzle on 11/03/2012 and discharged home with a foley was brought to the ED by his wife after she noticed him to have sudden onset chills and shaking this off and on. She quoted his temperature and was about 100F. Patient also complained of some infraumbilical pain and appeared confused. Patient's wife called Dr Hilario Quarry office and was instructed to work with the ED.   PT Comments   Pt more awake today. Wife present. Pt improved with transfer from bed to recliner with 2 person assist. Pt required total assist to get to sitting position on the bed.  Follow Up Recommendations  Home health PT;Supervision/Assistance - 24 hour (unless  wife cannot provide care.)     Does the patient have the potential to tolerate intense rehabilitation     Barriers to Discharge        Equipment Recommendations  None recommended by PT    Recommendations for Other Services    Frequency Min 3X/week   Progress towards PT Goals Progress towards PT goals: Progressing toward goals  Plan Current plan remains appropriate    Precautions / Restrictions Precautions Precautions: Fall   Pertinent Vitals/Pain No c/o    Mobility  Bed Mobility Bed Mobility: Supine to Sit Supine to Sit: HOB elevated;1: +2 Total assist Supine to Sit: Patient Percentage: 20% Details for Bed Mobility Assistance: pt awake but is not  able to really assist, bed pad used to slide pt . Transfers Sit to Stand: 1: +2 Total assist;From bed;From elevated surface Sit to Stand: Patient Percentage: 60% Stand to Sit: 1: +2 Total assist;To chair/3-in-1 Stand to Sit: Patient Percentage: 60% Stand Pivot Transfers: 1: +2 Total assist Stand Pivot Transfers: Patient Percentage: 60% Details for Transfer Assistance: inreased time, multimodal cues, frequntly, step by step  for turning  and backing up to recliner., reaching back. Pt was able to take steps to move to recliner. much imptoved than previous session. Ambulation/Gait Ambulation/Gait Assistance: Not tested (comment)    Exercises     PT Diagnosis:    PT Problem List:   PT Treatment Interventions:     PT Goals (current goals can now be found in the care plan section)    Visit Information  Last PT Received On: 11/20/12 Assistance Needed: +2 History of Present Illness: 79 year old obese  male with a history of diabetes mellitus on insulin, hypertension, coronary artery disease, A. fib and history of PE on chronic Coumadin, GERD, history of peripheral vascular disease, depression, hard of hearing, history of pancreatic tumor status post resection and his recent transurethral bladder tumor resection and left ureteral stent placed by Dr Margarita Grizzle on 11/03/2012 and discharged home with a foley was brought to the ED by his wife after she noticed him to have sudden onset chills and shaking this off and on. She quoted his temperature and was about 100F. Patient also complained of some infraumbilical pain and appeared confused. Patient's wife called Dr Hilario Quarry office and was instructed to work with  the ED.    Subjective Data      Cognition  Cognition Arousal/Alertness: Awake/alert Behavior During Therapy: Flat affect Overall Cognitive Status: History of cognitive impairments - at baseline    Balance  Balance Balance Assessed: Yes Static Sitting Balance Static Sitting - Balance  Support: Bilateral upper extremity supported Static Sitting - Level of Assistance: 5: Stand by assistance  End of Session PT - End of Session Equipment Utilized During Treatment: Gait belt Activity Tolerance: Patient tolerated treatment well Patient left: in chair;with call bell/phone within reach;with family/visitor present Nurse Communication: Mobility status;Need for lift equipment   GP     Rada Hay 11/20/2012, 11:29 AM Blanchard Kelch PT [phone]

## 2012-11-20 NOTE — Discharge Summary (Signed)
Physician Discharge Summary  Frederick [name]:[id number] DOB: December 06, 1929 DOA: 11/15/2012  PCP: Georgianne Fick, MD  Admit date: 11/15/2012 Discharge date: 11/20/2012  Time spent: >30 minutes  Recommendations for Outpatient Follow-up:  CBC to follow WBC's trend and Hgb BMET to follow electrolytes, CO2 and renal function Reassess BP and adjust medications as need if appropriate (especially resumption of diuretics and Avapro)  Discharge Diagnoses:  Principal Problem:   Sepsis Active Problems:   MALIGNANT NEOPLASM OF KIDNEY EXCEPT PELVIS   DIABETES MELLITUS, TYPE II, ON INSULIN   HYPERTENSION   RESPIRATORY FAILURE, CHRONIC   GERD   Pulmonary embolism   UTI (urinary tract infection)   Atrial fibrillation, permanent   Coronary artery disease, last cath 2008 occluded nondom, LCX, wth moderate ostial RCA disease, normal LV function   Warfarin anticoagulation, chronic   Discharge Condition: stable and improved. Will discharge home with [facility] services.  Diet recommendation: heart healthy and low carb diet  Filed Weights   11/16/12 0010 11/17/12 0500  Weight: 102.6 kg (226 lb 3.1 oz) 106.3 kg (234 lb 5.6 oz)    History of present illness:  79 year old obese male with a history of diabetes mellitus on insulin, hypertension, coronary artery disease, A. fib and history of PE on chronic Coumadin, GERD, history of peripheral vascular disease, depression, hard of hearing, history of pancreatic tumor status post resection and his recent transurethral bladder tumor resection and left ureteral stent placed by Dr Margarita Grizzle on 11/03/2012 and discharged home with a foley was brought to the ED by his wife after she noticed him to have sudden onset chills and shaking this off and on. She quoted his temperature and was about 100F. Patient also complained of some infraumbilical pain and appeared confused. Patient's wife called Dr Hilario Quarry office and was instructed to work with the ED.  She denied any  nausea, vomiting, chest pain, palpitations, worsened shortness of breath, diarrhea, hematuria. At baseline the patient has shortness of breath with exertion and is on 2 L oxygen via nasal cannula at home. He is wheelchair bound due to rt hip fracture and limited mobilityand has off-and-on leg edema.   Hospital Course:  1-SIRS/Sepsis: secondary to pseudomonas UTI  -blood cx's w/o growth  -patient received 4 days of IV broad spectrum antibiotics; once microorganism identified, sensitivity reported and patient able to tolerate PO's. Transition to oral regimen was made to ciprofloxacin (see below). -urine cx positive for pseudomonas, sensitive to cefepime and ciprofloxacin  -now that patient is doing better and tolerating well PO/afebrile has been transition to cipro PO to complete antibiotic therapy.  -no further fever and WBC's 14,000 from 38,000 at discharge -appreciated urology assistance and rec's   2-DM type 2:  -A1C 6.9  -continue SSI, glipizide and lantus   3-Hx of DVT/PE: continue coumadin per pharmacy. No more bridging needed. INR 2.5  4-HTN: stable and with SBP in the 130's to 140's now. Will continue  imdur, continue holding ARB and lasix until follow up with PCP. -advise to follow heart healthy diet  5-Chronic resp failure: per patient at baseline  -base on CXR findings 2-D echo ordered (preserved EF, no wall motion abnormalities and no valvular disease)  -given atrial fibrillation most likely diastolic dysfunction, but compensated.  -continue oxygen supplementation  -follow heart healthy diet  6-renal insufficiency: acute and most likely due to low BP, UTI and continue use of lasix and ARB.  -continue holding ARB and lasix until he follow with PCP -patient encourage to keep himself well  hydrated by mouth.  -continue treatment for UTI using ciprofloxacin BID  -Cr 1.13 at discharge.   7-GERD: continue PPI   8-Bladder tumor/urinary retention: per urology. Will leave foley in  place and he will follow as an outpatient with urology service for voiding trials and further treatment.  9-Atrial fibrillation: rate controlled. Continue metoprolol and also coumadin per pharmacy. INR 2.5  10-decubitus pressure ulcers: stage 1-2; continue skin care and prevention.  Procedures:  See below for x-ray reports  Consultations:  urology  Discharge Exam: Filed Vitals:   11/20/12 0520  BP: 122/70  Pulse: 95  Temp: 98.2 F (36.8 C)  Resp: 22   General: Afebrile this morning, NAD; feeling better overall.  Cardiovascular: rate controlled; no rubs or gallops  Respiratory: good air movement, no wheezing  Abdomen: soft, NT, ND; slight discomfort with palpation over suprapubic area (but better according to patient); positive BS  Musculoskeletal: no edema, no cyanosis or clubbing   Discharge Instructions  Discharge Orders   Future Appointments Provider Department Dept Phone   12/15/2012 10:15 AM Runell Gess, MD [facility] [place] [phone]   Future Orders Complete By Expires   Diet - low sodium heart healthy  As directed    Discharge instructions  As directed    Comments:     -KEEP YOURSELF HYDRATED -STOP ZAROXOLYN, LASIX AND AVAPRO UNTIL FOLLOW UP WITH PRIMARY CARE DOCTOR -ARRANGE VISIT WITH PRIMARY CARE DOCTOR IN 1 WEEK -FOLLOW A LOW SODIUM DIET -TAKE MEDICATIONS AS PRESCRIBED -CONTACT UROLOGY SERVICE OFFICE FOR FOLLOW UP APPOINTMENT OVER THE NEXT 2 WEEKS.   Home Health  As directed    Scheduling Instructions:     PLEASE RESUME HOME HEALTH SERVICES AS PRIOR TO ADMISSION, WITH INCLUSION OF PT, OT AND SOCIAL WORK   Questions:     To provide the following care/treatments:  PT   OT   Social work   Charity fundraiser       Medication List    STOP taking these medications       celecoxib 100 MG capsule  Commonly known as:  CELEBREX     cephALEXin 500 MG capsule  Commonly known as:  KEFLEX     enoxaparin 40 MG/0.4ML injection   Commonly known as:  LOVENOX      TAKE these medications       atorvastatin 40 MG tablet  Commonly known as:  LIPITOR  Take 40 mg by mouth every evening.     chlordiazePOXIDE 10 MG capsule  Commonly known as:  LIBRIUM  Take 10 mg by mouth 3 (three) times daily.     ciprofloxacin 500 MG tablet  Commonly known as:  CIPRO  Take 1 tablet (500 mg total) by mouth 2 (two) times daily.     DULoxetine 60 MG capsule  Commonly known as:  CYMBALTA  Take 60 mg by mouth at bedtime.     furosemide 40 MG tablet  Commonly known as:  LASIX  Take 1-2 tablets (40-80 mg total) by mouth 2 (two) times daily. -HOLD UNTIL FOLLOW UP WITH PRIMARY CARE DOCTOR     glipiZIDE 10 MG tablet  Commonly known as:  GLUCOTROL  Take 10 mg by mouth daily.     HYDROcodone-acetaminophen 5-325 MG per tablet  Commonly known as:  NORCO/VICODIN  Take 1-2 tablets by mouth every 4 (four) hours as needed for pain.     hyoscyamine 0.125 MG tablet  Commonly known as:  LEVSIN, ANASPAZ  Take 1 tablet (  0.125 mg total) by mouth every 4 (four) hours as needed for cramping (bladder spasms).     insulin glargine 100 UNIT/ML injection  Commonly known as:  LANTUS  Inject 46 Units into the skin daily. Daily with breakfast     insulin lispro 100 UNIT/ML injection  Commonly known as:  HUMALOG  Inject 2-4 Units into the skin daily before breakfast. Sliding scale     irbesartan 150 MG tablet  Commonly known as:  AVAPRO  Take 1 tablet (150 mg total) by mouth daily with breakfast. -HOLD UNTIL FOLLOW UP WITH PRIMARY CARE DOCTOR     isosorbide mononitrate 60 MG 24 hr tablet  Commonly known as:  IMDUR  Take 60 mg by mouth daily with supper.     lansoprazole 30 MG capsule  Commonly known as:  PREVACID  Take 1 capsule (30 mg total) by mouth daily.     liver oil-zinc oxide 40 % ointment  Commonly known as:  DESITIN  Apply 1 application topically daily as needed for dry skin. Apply to bottom     metolazone 5 MG tablet   Commonly known as:  ZAROXOLYN  Take 1.5 tablets (7.5 mg total) by mouth daily. -HOLD UNTIL FOLLOW UP WITH PRIMARY CARE DOCTOR     phenazopyridine 100 MG tablet  Commonly known as:  PYRIDIUM  Take 1 tablet (100 mg total) by mouth every 8 (eight) hours as needed for pain (Burning urination.  Will turn urine and body fluids orange.).     potassium chloride SA 20 MEQ tablet  Commonly known as:  K-DUR,KLOR-CON  Take 1 tablet (20 mEq total) by mouth daily.     senna-docusate 8.6-50 MG per tablet  Commonly known as:  SENOKOT S  Take 1 tablet by mouth 2 (two) times daily.     warfarin 5 MG tablet  Commonly known as:  COUMADIN  Take 5 mg by mouth daily.       Allergies  Allergen Reactions  . Hydromorphone Hcl Other (See Comments)    Dilaudid caused Confusion   . Morphine Other (See Comments)    confusion  . Pregabalin Other (See Comments)    lyrica - caused hallucinations       Follow-up Information   Follow up with Runell Gess, MD On 12/15/2012. (10:15 AM)    Specialty:  Cardiology   Contact information:   [address] [place] [address] [phone]       Follow up with [facility], MD. Schedule an appointment as soon as possible for a visit in 1 week.   Specialty:  Internal Medicine   Contact information:   [address] [place] [address] [place] [address] [phone]       Schedule an appointment as soon as possible for a visit with Milford Cage, MD. (CALL OFFICE FOR APPOINTMENT DETAILS)    Specialty:  Urology   Contact information:   [address] [place] FLO[address] [place] [address] [phone]        The results of significant diagnostics from this hospitalization (including imaging, microbiology, ancillary and laboratory) are listed below for reference.    Significant Diagnostic Studies: Ct Abdomen Pelvis Wo Contrast  11/16/2012   *RADIOLOGY REPORT*  Clinical Data: Urinary tract  infection with sepsis.  Evaluate for potential pyelonephritis.  Fever.  CT ABDOMEN AND PELVIS WITHOUT CONTRAST  Technique:  Multidetector CT imaging of the abdomen and pelvis was performed following the standard protocol without intravenous contrast.  Comparison: CT of the abdomen and pelvis 10/14/2012.  Findings:  Lung Bases: Probable scarring in the left lung base.  Cardiomegaly. Atherosclerotic calcifications in the left main, left anterior descending, left circumflex and right coronary arteries. Calcifications of the aortic valve.  Moderate sized hiatal hernia. Calcified pleural plaques in the posterior aspect of the left hemithorax.  Abdomen/Pelvis:  Small calcified gallstones layering dependently in the gallbladder.  No current findings to suggest acute cholecystitis at this time.  Severe pancreatic atrophy.  Status post splenectomy.  Surgical clips near the gastroesophageal junction.  The unenhanced appearance of the right kidney and bilateral adrenal glands is unremarkable.  An exophytic low attenuation lesion measuring 3.7 cm in diameter extending from the posterior aspect of the left kidney is incompletely characterized on today's noncontrast CT examination, but was previously characterized as a simple cyst.  A left-sided double J ureteral stent with the proximal loop properly reformed within the left renal pelvis and the distal loop properly reformed within the lumen of the urinary bladder.  Numerous colonic diverticula, without surrounding inflammatory changes to suggest acute diverticulitis at this time.  No significant volume of ascites.  No pneumoperitoneum.  No pathologic distension of small bowel.  No definite pathologic lymphadenopathy identified within the abdomen or pelvis.  Foley balloon catheter in place with tip in the lumen of the urinary bladder.  Small amount of gas, dependently in the lumen of the urinary bladder presumably iatrogenic. Urinary bladder wall appears thickened, however, this  is likely accentuated by under distension of the urinary bladder. Small amount of stranding in the fat adjacent to the urinary bladder.  Musculoskeletal: There are no aggressive appearing lytic or blastic lesions noted in the visualized portions of the skeleton.  IMPRESSION: 1. Thickening of the urinary bladder wall with a small amount of soft tissue stranding in the adjacent fat surrounding the urinary bladder may suggest cystitis.  Clinical correlation is recommended.  2.  No imaging findings on today's noncontrast CT examination to strongly suggest presence of pyelonephritis, however, clinical correlation and urinalysis is recommended if there is suspicion for pyelonephritis. 3.  Left-sided double J ureteral stent appears properly located. 4.  Cholelithiasis without evidence to suggest acute cholecystitis at this time. 5.  Colonic diverticulosis without findings to suggest acute diverticulitis at this time. 6.  Status post splenectomy. 7.  Moderate hiatal hernia. 8.  Atherosclerosis, including left main and three post coronary artery disease. 9.  Additional incidental findings, as above.   Original Report Authenticated By: Trudie Reed, M.D.   Dg Chest 2 View  11/15/2012   *RADIOLOGY REPORT*  Clinical Data: Chest pain.  Shortness of breath.  Weakness.  CHEST - 2 VIEW  Comparison: Chest x-ray 10/12/2012.  Findings: Lung volumes are low.  Film is under penetrated, which limits the Diagnostic sensitivity and specificity of this examination.  With these limitations in mind, there are bibasilar opacities (left greater than right), favored to represent subsegmental atelectasis.  No definite pleural effusions. Cephalization of the pulmonary vasculature, with evidence of very mild interstitial pulmonary edema.  Mild cardiomegaly. Retrocardiac density compatible with a moderate sized hiatal hernia. The patient is rotated to the left on today's exam, resulting in distortion of the mediastinal contours and reduced  diagnostic sensitivity and specificity for mediastinal pathology. Atherosclerosis of the thoracic aorta. Multiple surgical clips are noted projecting over the upper abdomen.  There appears to be a left-sided double J ureteral stent in position, however this is incompletely visualized.  IMPRESSION: 1.  Findings, as above, concerning for early congestive heart failure. 2.  Atherosclerosis. 3.  Moderate hiatal hernia. 4.  Postoperative changes, as above.   Original Report Authenticated By: Trudie Reed, M.D.    Microbiology: Recent Results (from the past 240 hour(s))  URINE CULTURE     Status: None   Collection Time    11/15/12  8:17 PM      Result Value Range Status   Specimen Description URINE, RANDOM   Final   Special Requests NONE   Final   Culture  Setup Time     Final   Value: 11/16/2012 06:12     Performed at [facility] Count     Final   Value: >=100,000 COLONIES/ML     Performed at [facility]   Culture     Final   Value: PSEUDOMONAS AERUGINOSA     Performed at [facility]   Report Status 11/17/2012 FINAL   Final   Organism ID, Bacteria PSEUDOMONAS AERUGINOSA   Final  CULTURE, BLOOD (ROUTINE X 2)     Status: None   Collection Time    11/15/12  8:28 PM      Result Value Range Status   Specimen Description BLOOD RIGHT HAND   Final   Special Requests BOTTLES DRAWN AEROBIC AND ANAEROBIC   Final   Culture  Setup Time     Final   Value: 11/16/2012 06:10     Performed at [facility]   Culture     Final   Value:        BLOOD CULTURE RECEIVED NO GROWTH TO DATE CULTURE WILL BE HELD FOR 5 DAYS BEFORE ISSUING A FINAL NEGATIVE REPORT     Performed at [facility]   Report Status PENDING   Incomplete  CULTURE, BLOOD (ROUTINE X 2)     Status: None   Collection Time    11/15/12  8:44 PM      Result Value Range Status   Specimen Description BLOOD LEFT HAND   Final   Special Requests BOTTLES DRAWN AEROBIC AND ANAEROBIC    Final   Culture  Setup Time     Final   Value: 11/16/2012 06:11     Performed at [facility]   Culture     Final   Value:        BLOOD CULTURE RECEIVED NO GROWTH TO DATE CULTURE WILL BE HELD FOR 5 DAYS BEFORE ISSUING A FINAL NEGATIVE REPORT     Performed at [facility]   Report Status PENDING   Incomplete  MRSA PCR SCREENING     Status: Abnormal   Collection Time    11/16/12 12:11 AM      Result Value Range Status   MRSA by PCR POSITIVE (*) NEGATIVE Final   Comment:            The GeneXpert MRSA Assay (FDA     approved for NASAL specimens     only), is one component of a     comprehensive MRSA colonization     surveillance program. It is not     intended to diagnose MRSA     infection nor to guide or     monitor treatment for     MRSA infections.     RESULT CALLED TO, READ BACK BY AND VERIFIED WITH:     STOCKS,M/2W @0252  ON 11/16/12 BY KARCZEWSKI,S.     Labs: Basic Metabolic  Panel:  Recent Labs Lab 11/16/12 0330 11/17/12 0330 11/18/12 0540 11/19/12 0500 11/20/12 0532  NA 136 138 141 144 144  K 3.2* 3.7 3.6 2.8* 3.6  CL 93* 97 99 98 98  CO2 39* 38* 36* 41* 40*  GLUCOSE 193* 141* 206* 192* 186*  BUN 38* 30* 22 18 20   CREATININE 1.43* 1.38* 1.11 1.26 1.13  CALCIUM 8.6 8.7 9.4 9.7 9.6   Liver Function Tests:  Recent Labs Lab 11/15/12 1926  AST 23  ALT 12  ALKPHOS 94  BILITOT 0.3  PROT 7.6  ALBUMIN 3.4*   CBC:  Recent Labs Lab 11/15/12 1926 11/16/12 0330 11/17/12 0330 11/19/12 0500  WBC 30.6* 38.1* 28.6* 14.8*  NEUTROABS 27.6* 35.5*  --   --   HGB 11.9* 10.2* 9.7* 9.9*  HCT 37.7* 33.1* 31.9* 33.0*  MCV 99.0 99.1 100.3* 100.3*  PLT 342 298 289 354   BNP: BNP (last 3 results)  Recent Labs  11/15/12 1926  PROBNP 663.1*   CBG:  Recent Labs Lab 11/19/12 1154 11/19/12 1649 11/19/12 2111 11/20/12 0718 11/20/12 1159  GLUCAP 177* 174* 195* 158* 206*     Signed:  Maleek Craver  [facility] 11/20/2012, 12:54  PM

## 2012-11-20 NOTE — Progress Notes (Signed)
ANTICOAGULATION CONSULT NOTE - Follow Up Consult  Pharmacy Consult for Coumadin Indication: h/o PE, atrial fibrillation  Allergies  Allergen Reactions  . Hydromorphone Hcl Other (See Comments)    Dilaudid caused Confusion   . Morphine Other (See Comments)    confusion  . Pregabalin Other (See Comments)    lyrica - caused hallucinations   Labs:  Recent Labs  11/18/12 0540 11/19/12 0500 11/20/12 0532  HGB  --  9.9*  --   HCT  --  33.0*  --   PLT  --  354  --   LABPROT 35.5* 30.4* 26.6*  INR 3.73* 3.04* 2.55*  CREATININE 1.11 1.26 1.13    Estimated Creatinine Clearance: 62.6 ml/min (by C-G formula based on Cr of 1.13).    Assessment: 37 yom with h/o PE on chronic Coumadin for Afib and hx PE. Patient was transitioned off Coumadin 7/24 and started Lovenox 40 mg sq daily 7/25 for resection of bladder tumor 7/30. Post-operatively, patient was resumed on both Coumadin and Lovenox 40 mg sq daily 7/31.  INR on 8/4 at [facility] was 1.6, patient was given Coumadin 7.5mg .  Confirmed with Virgina Evener, PharmD - patient's usual dose of Coumadin was 5mg  po daily.  Coumadin and Lovenox resumed inpatient given subtherapeutic INR on admit.   INR = 2.55 today, INR trending down after high of 3.73  CBC stable, no visible bleeding reported  Antibiotics changed to PO cipro - need to watch INR closely for potential drug interaction  Goal of Therapy:  INR 2-3 Monitor platelets by anticoagulation protocol: Yes   Plan:   Coumadin 2.5mg  po tonight, use lower dose d/t recent INR elevation (likely from 7.5mg  x2) and now that on ciprofloxacin  Daily PT/INR  Juliette Alcide, PharmD, BCPS.   Pager: [phone] 11/20/2012 12:22 PM

## 2012-11-22 LAB — CULTURE, BLOOD (ROUTINE X 2): Culture: NO GROWTH

## 2012-11-25 ENCOUNTER — Emergency Department (HOSPITAL_COMMUNITY): Payer: Medicare Other

## 2012-11-25 ENCOUNTER — Encounter (HOSPITAL_COMMUNITY): Payer: Self-pay | Admitting: Emergency Medicine

## 2012-11-25 ENCOUNTER — Inpatient Hospital Stay (HOSPITAL_COMMUNITY)
Admission: EM | Admit: 2012-11-25 | Discharge: 2012-12-07 | DRG: 291 | Disposition: A | Discharge status: Other Acute Inpatient Hospital | Payer: Medicare Other | Attending: Internal Medicine | Admitting: Internal Medicine

## 2012-11-25 DIAGNOSIS — G92 Toxic encephalopathy: Secondary | ICD-10-CM | POA: Diagnosis present

## 2012-11-25 DIAGNOSIS — I2699 Other pulmonary embolism without acute cor pulmonale: Secondary | ICD-10-CM

## 2012-11-25 DIAGNOSIS — C679 Malignant neoplasm of bladder, unspecified: Secondary | ICD-10-CM | POA: Diagnosis present

## 2012-11-25 DIAGNOSIS — R627 Adult failure to thrive: Secondary | ICD-10-CM | POA: Diagnosis present

## 2012-11-25 DIAGNOSIS — A419 Sepsis, unspecified organism: Secondary | ICD-10-CM

## 2012-11-25 DIAGNOSIS — I10 Essential (primary) hypertension: Secondary | ICD-10-CM | POA: Diagnosis present

## 2012-11-25 DIAGNOSIS — Z794 Long term (current) use of insulin: Secondary | ICD-10-CM

## 2012-11-25 DIAGNOSIS — I5033 Acute on chronic diastolic (congestive) heart failure: Principal | ICD-10-CM | POA: Diagnosis present

## 2012-11-25 DIAGNOSIS — E119 Type 2 diabetes mellitus without complications: Secondary | ICD-10-CM | POA: Diagnosis present

## 2012-11-25 DIAGNOSIS — E669 Obesity, unspecified: Secondary | ICD-10-CM | POA: Diagnosis present

## 2012-11-25 DIAGNOSIS — H9190 Unspecified hearing loss, unspecified ear: Secondary | ICD-10-CM | POA: Diagnosis present

## 2012-11-25 DIAGNOSIS — I5030 Unspecified diastolic (congestive) heart failure: Secondary | ICD-10-CM

## 2012-11-25 DIAGNOSIS — J9600 Acute respiratory failure, unspecified whether with hypoxia or hypercapnia: Secondary | ICD-10-CM | POA: Diagnosis not present

## 2012-11-25 DIAGNOSIS — J9811 Atelectasis: Secondary | ICD-10-CM | POA: Diagnosis not present

## 2012-11-25 DIAGNOSIS — I2510 Atherosclerotic heart disease of native coronary artery without angina pectoris: Secondary | ICD-10-CM | POA: Diagnosis present

## 2012-11-25 DIAGNOSIS — F329 Major depressive disorder, single episode, unspecified: Secondary | ICD-10-CM | POA: Diagnosis present

## 2012-11-25 DIAGNOSIS — E871 Hypo-osmolality and hyponatremia: Secondary | ICD-10-CM | POA: Diagnosis present

## 2012-11-25 DIAGNOSIS — F411 Generalized anxiety disorder: Secondary | ICD-10-CM | POA: Diagnosis present

## 2012-11-25 DIAGNOSIS — R4789 Other speech disturbances: Secondary | ICD-10-CM | POA: Diagnosis present

## 2012-11-25 DIAGNOSIS — J90 Pleural effusion, not elsewhere classified: Secondary | ICD-10-CM

## 2012-11-25 DIAGNOSIS — N390 Urinary tract infection, site not specified: Secondary | ICD-10-CM | POA: Diagnosis present

## 2012-11-25 DIAGNOSIS — E876 Hypokalemia: Secondary | ICD-10-CM | POA: Diagnosis not present

## 2012-11-25 DIAGNOSIS — I739 Peripheral vascular disease, unspecified: Secondary | ICD-10-CM | POA: Diagnosis present

## 2012-11-25 DIAGNOSIS — I4891 Unspecified atrial fibrillation: Secondary | ICD-10-CM | POA: Diagnosis present

## 2012-11-25 DIAGNOSIS — K219 Gastro-esophageal reflux disease without esophagitis: Secondary | ICD-10-CM | POA: Diagnosis present

## 2012-11-25 DIAGNOSIS — R410 Disorientation, unspecified: Secondary | ICD-10-CM | POA: Diagnosis present

## 2012-11-25 DIAGNOSIS — R0989 Other specified symptoms and signs involving the circulatory and respiratory systems: Secondary | ICD-10-CM | POA: Diagnosis present

## 2012-11-25 DIAGNOSIS — Z7901 Long term (current) use of anticoagulants: Secondary | ICD-10-CM

## 2012-11-25 DIAGNOSIS — J811 Chronic pulmonary edema: Secondary | ICD-10-CM

## 2012-11-25 DIAGNOSIS — E785 Hyperlipidemia, unspecified: Secondary | ICD-10-CM | POA: Diagnosis present

## 2012-11-25 DIAGNOSIS — J9601 Acute respiratory failure with hypoxia: Secondary | ICD-10-CM

## 2012-11-25 DIAGNOSIS — Z515 Encounter for palliative care: Secondary | ICD-10-CM

## 2012-11-25 DIAGNOSIS — R531 Weakness: Secondary | ICD-10-CM

## 2012-11-25 DIAGNOSIS — R5381 Other malaise: Secondary | ICD-10-CM | POA: Diagnosis present

## 2012-11-25 DIAGNOSIS — K222 Esophageal obstruction: Secondary | ICD-10-CM | POA: Diagnosis present

## 2012-11-25 DIAGNOSIS — R1310 Dysphagia, unspecified: Secondary | ICD-10-CM

## 2012-11-25 DIAGNOSIS — J690 Pneumonitis due to inhalation of food and vomit: Secondary | ICD-10-CM

## 2012-11-25 DIAGNOSIS — Z86711 Personal history of pulmonary embolism: Secondary | ICD-10-CM

## 2012-11-25 DIAGNOSIS — F3289 Other specified depressive episodes: Secondary | ICD-10-CM | POA: Diagnosis present

## 2012-11-25 DIAGNOSIS — G929 Unspecified toxic encephalopathy: Secondary | ICD-10-CM | POA: Diagnosis present

## 2012-11-25 DIAGNOSIS — D126 Benign neoplasm of colon, unspecified: Secondary | ICD-10-CM

## 2012-11-25 DIAGNOSIS — J9610 Chronic respiratory failure, unspecified whether with hypoxia or hypercapnia: Secondary | ICD-10-CM

## 2012-11-25 DIAGNOSIS — Z993 Dependence on wheelchair: Secondary | ICD-10-CM

## 2012-11-25 DIAGNOSIS — R1312 Dysphagia, oropharyngeal phase: Secondary | ICD-10-CM | POA: Diagnosis present

## 2012-11-25 DIAGNOSIS — R4182 Altered mental status, unspecified: Secondary | ICD-10-CM

## 2012-11-25 DIAGNOSIS — N179 Acute kidney failure, unspecified: Secondary | ICD-10-CM | POA: Diagnosis present

## 2012-11-25 NOTE — ED Notes (Signed)
Wife states that the pt has had a rattling cough today  Home health nurse thought maybe he had aspirated at some point  Pt earlier today was coughing up white sputum per wife  Pt in no acute distress at this time  Pt on oxygen at 2 liters/min via [place]

## 2012-11-25 NOTE — ED Provider Notes (Signed)
CSN: [id number]     Arrival date & time 11/25/12  2127 History     First MD Initiated Contact with Patient 11/25/12 2234     Chief Complaint  Patient presents with  . Cough   (Consider location/radiation/quality/duration/timing/severity/associated sxs/prior Treatment) Patient is a 79 y.o. male presenting with cough. The history is provided by the patient and the spouse. No language interpreter was used.  Cough Cough characteristics:  Productive Sputum characteristics:  White Severity:  Mild Onset quality:  Sudden Timing:  Unable to specify Progression:  Unable to specify Chronicity:  New Smoker: no   Associated symptoms: no chest pain and no fever     Past Medical History  Diagnosis Date  . Diabetes mellitus   . Hypertension   . Hypercholesterolemia   . Esophageal stricture   . Pulmonary embolism 2008    chronic coumadin   . Coronary artery disease     DR. BERRY IS PT'S CARDIOLOGIST  . Atrial fibrillation     CHRONIC COUMADIN-pemanent atrial fib  . Anxiety     SINCE 1975   . Depression     SINCE 1975    . Shortness of breath     NOT SURE WHAT CAUSES SOB - BUT HE IS NOT ABLE TO BE ACTIVE - AND IS SOB WITH ANY ACTIVITY - USES OXYGEN 2 L NASAL CANNULA - ALL THE TIME -EXCEPT WHEN IN SHOWER OR CHANGING CLOTHES on home 02  . Peripheral vascular disease     TOLD SOME SMALL AMOUNT OF BLOCKAGE IN LEG WHEN HEART CATH DONE 2008  . Bladder tumor     HAS HAD HEMATURIA OFF AND ON - HAS FOLEY CATH THAT WAS PLACED JUNE 20TH, 2014  . GERD (gastroesophageal reflux disease)   . Edema     FEET AND LEGS MOST DAYS - SOMETIMES WEARS COMPRESSION HOSE  . Cancer     SKIN CANCERS  . Arthritis     S/P BILATERAL TOTAL KNEE REPLACEMENTS - BUT BOTH KNEES PAINFUL AND JOINTS WORN OUT; BAD RIGHT HIP - BUT NOT A CANDIDATE FOR HIP PREPLACMENT;  HAS SEVERE LOWER  BACK AND LEG PAIN - HAS SPINAL STENOSIS AND BULGING DISC; CURVATURE OF UPPER SPINE - UNABLE TO LAY HIS HEAD FLAT.  Marland Kitchen Pneumonia 2005  .  Difficult intubation     DUE TO LIMITED NECK FLEXION AND SHORT NECK--ANESTHESIA RECORD FROM 2007 VATS SURGERY AT [facility] OBTAINED AND ON PT'S CHART.  Marland Kitchen Problems with hearing     WEARS BILATERAL HEARING AIDS   Past Surgical History  Procedure Laterality Date  . Shoulder surgery      bilateral  . Pancreas surgery  2001    TUMOR REMOVED FROM PANCREAS AND SPLEEN ALSO REMOVED  . Total knee arthroplasty      bilateral  . Lung surgery  2007    non-cancer  VIDEO ASSISTED THORCOTOMY TO REMOVE FLUID / DECORTICATION. DR. Edwyna Shell  . Basil cell    . Cardiac catheterization  2008    totally occluded nondominant LCX wth mod. ostial RCA disease and nl. EF  . Esophageal stretch    . Skin cancer removed from left side of head - required skin graft from left leg    . Cartilage repair 198- left knee    . Joint replacement    . Transurethral resection of bladder tumor with gyrus (turbt-gyrus) N/A 11/03/2012    Procedure: TRANSURETHRAL RESECTION OF BLADDER TUMOR WITH GYRUS (TURBT-GYRUS);  Surgeon: Milford Cage, MD;  Location: [facility];  Service: Urology;  Laterality: N/A;  . Cystoscopy/retrograde/ureteroscopy  11/03/2012    Procedure: CYSTOSCOPY/RETROGRADE/ LEFT URETEROSCOPY AND STENT PLACEMENT;  Surgeon: Milford Cage, MD;  Location: [facility];  Service: Urology;;  . Theador Hawthorne N/A 11/03/2012    Procedure: CYSTOGRAM;  Surgeon: Milford Cage, MD;  Location: [facility];  Service: Urology;  Laterality: N/A;   Family History  Problem Relation Age of Onset  . Heart disease Mother   . Melanoma Father    History  Substance Use Topics  . Smoking status: Never Smoker   . Smokeless tobacco: Never Used  . Alcohol Use: No    Review of Systems  Constitutional: Negative for fever.  Respiratory: Positive for cough.   Cardiovascular: Negative for chest pain.  All other systems reviewed and are negative.    Allergies  Hydromorphone hcl; Morphine; and Pregabalin  Home Medications   Current  Outpatient Rx  Name  Route  Sig  Dispense  Refill  . atorvastatin (LIPITOR) 40 MG tablet   Oral   Take 40 mg by mouth every evening.           . chlordiazePOXIDE (LIBRIUM) 10 MG capsule   Oral   Take 10 mg by mouth 3 (three) times daily.           . ciprofloxacin (CIPRO) 500 MG tablet   Oral   Take 1 tablet (500 mg total) by mouth 2 (two) times daily.   20 tablet   0   . DULoxetine (CYMBALTA) 60 MG capsule   Oral   Take 60 mg by mouth at bedtime.           . furosemide (LASIX) 40 MG tablet   Oral   Take 1-2 tablets (40-80 mg total) by mouth 2 (two) times daily. -HOLD UNTIL FOLLOW UP WITH PRIMARY CARE DOCTOR   30 tablet      . glipiZIDE (GLUCOTROL) 10 MG tablet   Oral   Take 10 mg by mouth daily.          Marland Kitchen HYDROcodone-acetaminophen (NORCO/VICODIN) 5-325 MG per tablet   Oral   Take 1-2 tablets by mouth every 4 (four) hours as needed for pain.   30 tablet   0   . hyoscyamine (LEVSIN, ANASPAZ) 0.125 MG tablet   Oral   Take 1 tablet (0.125 mg total) by mouth every 4 (four) hours as needed for cramping (bladder spasms).   40 tablet   4   . insulin glargine (LANTUS) 100 UNIT/ML injection   Subcutaneous   Inject 46 Units into the skin daily. Daily with breakfast         . insulin lispro (HUMALOG) 100 UNIT/ML injection   Subcutaneous   Inject 2-4 Units into the skin daily before breakfast. Sliding scale         . irbesartan (AVAPRO) 150 MG tablet   Oral   Take 1 tablet (150 mg total) by mouth daily with breakfast. -HOLD UNTIL FOLLOW UP WITH PRIMARY CARE DOCTOR         . isosorbide mononitrate (IMDUR) 60 MG 24 hr tablet   Oral   Take 60 mg by mouth daily with supper.          Marland Kitchen EXPIRED: lansoprazole (PREVACID) 30 MG capsule   Oral   Take 1 capsule (30 mg total) by mouth daily.   90 capsule   4   . liver oil-zinc oxide (DESITIN) 40 % ointment   Topical   Apply 1  application topically daily as needed for dry skin. Apply to bottom         .  metolazone (ZAROXOLYN) 5 MG tablet   Oral   Take 1.5 tablets (7.5 mg total) by mouth daily. -HOLD UNTIL FOLLOW UP WITH PRIMARY CARE DOCTOR         . phenazopyridine (PYRIDIUM) 100 MG tablet   Oral   Take 1 tablet (100 mg total) by mouth every 8 (eight) hours as needed for pain (Burning urination.  Will turn urine and body fluids orange.).   30 tablet   1   . potassium chloride SA (K-DUR,KLOR-CON) 20 MEQ tablet   Oral   Take 1 tablet (20 mEq total) by mouth daily.         Marland Kitchen senna-docusate (SENOKOT S) 8.6-50 MG per tablet   Oral   Take 1 tablet by mouth 2 (two) times daily.   60 tablet   0   . warfarin (COUMADIN) 5 MG tablet   Oral   Take 5 mg by mouth daily.           BP 136/73  Pulse 99  Temp(Src) 98.4 F (36.9 C)  Resp 22  SpO2 94% Physical Exam  Nursing note and vitals reviewed. Constitutional: He appears well-developed and well-nourished.  HENT:  Head: Normocephalic.  Eyes: Pupils are equal, round, and reactive to light.  Cardiovascular: Normal rate and regular rhythm.   Pulmonary/Chest: Effort normal.  Abdominal: Soft. Bowel sounds are normal.  Lymphadenopathy:    He has no cervical adenopathy.  Neurological: He is alert.  Skin: Skin is warm and dry.  Psychiatric: He has a normal mood and affect.    ED Course   Procedures (including critical care time)  Labs Reviewed - No data to display No results found. No diagnosis found. Patient recently discharged to home after inpatient treatment for UTI/AMS/sepsis.  Patient is normally wheelchair bound.  Patient currently undergoing PT/rehab at home.  Patient has had some increasing difficulty swallowing according to wife. Concern that patient may have aspirated today.    Chest film indicates pulmonary vascular congestion and perihilar edema, LLL infiltrate/atelectasis with superimposed pleural effusion.  Patient has not been taking his zaroxolyn or lasix since discharge due to hypokalemia.  No current  fever/chills, but mild dyspnea and leukocytosis.  Discussed with Dr. Jeraldine Loots.  Will request admission to hospitalist. MDM    Jimmye Norman, NP 11/26/12 [phone]

## 2012-11-26 ENCOUNTER — Encounter (HOSPITAL_COMMUNITY): Payer: Self-pay | Admitting: Cardiology

## 2012-11-26 DIAGNOSIS — E119 Type 2 diabetes mellitus without complications: Secondary | ICD-10-CM

## 2012-11-26 DIAGNOSIS — J90 Pleural effusion, not elsewhere classified: Secondary | ICD-10-CM

## 2012-11-26 DIAGNOSIS — J811 Chronic pulmonary edema: Secondary | ICD-10-CM | POA: Insufficient documentation

## 2012-11-26 DIAGNOSIS — I4891 Unspecified atrial fibrillation: Secondary | ICD-10-CM

## 2012-11-26 LAB — BASIC METABOLIC PANEL
CO2: 42 mEq/L (ref 19–32)
Calcium: 9.7 mg/dL (ref 8.4–10.5)
Creatinine, Ser: 1.06 mg/dL (ref 0.50–1.35)
GFR calc Af Amer: 74 mL/min — ABNORMAL LOW (ref >90)
GFR calc non Af Amer: 64 mL/min — ABNORMAL LOW (ref >90)
Sodium: 142 mEq/L (ref 135–145)

## 2012-11-26 LAB — CBC WITH DIFFERENTIAL/PLATELET
Basophils Absolute: 0 10*3/uL (ref 0.0–0.1)
Basophils Relative: 0 % (ref 0–1)
Eosinophils Relative: 2 % (ref 0–5)
Lymphocytes Relative: 12 % (ref 12–46)
MCHC: 29.5 g/dL — ABNORMAL LOW (ref 30.0–36.0)
MCV: 100.8 fL — ABNORMAL HIGH (ref 78.0–100.0)
Platelets: 474 10*3/uL — ABNORMAL HIGH (ref 150–400)
RDW: 14 % (ref 11.5–15.5)
WBC: 17 10*3/uL — ABNORMAL HIGH (ref 4.0–10.5)

## 2012-11-26 LAB — GLUCOSE, CAPILLARY
Glucose-Capillary: 107 mg/dL — ABNORMAL HIGH (ref 70–99)
Glucose-Capillary: 67 mg/dL — ABNORMAL LOW (ref 70–99)
Glucose-Capillary: 90 mg/dL (ref 70–99)
Glucose-Capillary: 92 mg/dL (ref 70–99)

## 2012-11-26 LAB — TROPONIN I: Troponin I: <0.3 ng/mL (ref <0.30)

## 2012-11-26 LAB — MAGNESIUM: Magnesium: 1.9 mg/dL (ref 1.5–2.5)

## 2012-11-26 LAB — MRSA PCR SCREENING: MRSA by PCR: POSITIVE — AB

## 2012-11-26 LAB — PROTIME-INR: Prothrombin Time: 25.2 seconds — ABNORMAL HIGH (ref 11.6–15.2)

## 2012-11-26 MED ORDER — FUROSEMIDE 10 MG/ML IJ SOLN
80.0000 mg | Freq: Three times a day (TID) | INTRAMUSCULAR | Status: DC
  Administered 2012-11-26 (×2): 80 mg via INTRAVENOUS
  Filled 2012-11-26: qty 8

## 2012-11-26 MED ORDER — FUROSEMIDE 10 MG/ML IJ SOLN
40.0000 mg | Freq: Three times a day (TID) | INTRAMUSCULAR | Status: DC
  Administered 2012-11-26 – 2012-11-27 (×2): 40 mg via INTRAVENOUS
  Filled 2012-11-26 (×5): qty 4

## 2012-11-26 MED ORDER — WARFARIN - PHARMACIST DOSING INPATIENT: Freq: Every day | Status: DC

## 2012-11-26 MED ORDER — SODIUM CHLORIDE 0.9 % IJ SOLN: 3.0000 mL | INTRAMUSCULAR | Status: DC | PRN

## 2012-11-26 MED ORDER — ISOSORBIDE MONONITRATE ER 60 MG PO TB24
60.0000 mg | ORAL_TABLET | Freq: Every day | ORAL | Status: DC
  Filled 2012-11-26 (×3): qty 1

## 2012-11-26 MED ORDER — CHLORDIAZEPOXIDE HCL 10 MG PO CAPS
10.0000 mg | ORAL_CAPSULE | Freq: Three times a day (TID) | ORAL | Status: DC
  Administered 2012-11-28 – 2012-11-30 (×9): 10 mg via ORAL
  Filled 2012-11-26 (×10): qty 1
  Filled 2012-11-26: qty 2

## 2012-11-26 MED ORDER — IRBESARTAN 150 MG PO TABS: 150.0000 mg | ORAL_TABLET | Freq: Every day | ORAL | Status: DC

## 2012-11-26 MED ORDER — ACETAMINOPHEN 325 MG PO TABS
650.0000 mg | ORAL_TABLET | ORAL | Status: DC | PRN
  Administered 2012-11-28: 650 mg via ORAL
  Filled 2012-11-26: qty 2

## 2012-11-26 MED ORDER — SODIUM CHLORIDE 0.9 % IJ SOLN
3.0000 mL | Freq: Two times a day (BID) | INTRAMUSCULAR | Status: DC
  Administered 2012-11-28: 3 mL via INTRAVENOUS

## 2012-11-26 MED ORDER — POTASSIUM CHLORIDE CRYS ER 20 MEQ PO TBCR
20.0000 meq | EXTENDED_RELEASE_TABLET | Freq: Every day | ORAL | Status: DC
  Filled 2012-11-26 (×3): qty 1

## 2012-11-26 MED ORDER — HYDROCODONE-ACETAMINOPHEN 5-325 MG PO TABS: 1.0000 | ORAL_TABLET | ORAL | Status: DC | PRN

## 2012-11-26 MED ORDER — POTASSIUM CHLORIDE CRYS ER 20 MEQ PO TBCR: 40.0000 meq | EXTENDED_RELEASE_TABLET | Freq: Once | ORAL | Status: DC

## 2012-11-26 MED ORDER — JUVEN PO PACK
1.0000 | PACK | Freq: Two times a day (BID) | ORAL | Status: DC
  Administered 2012-11-29: 1 via ORAL
  Filled 2012-11-26 (×6): qty 1

## 2012-11-26 MED ORDER — ONDANSETRON HCL 4 MG/2ML IJ SOLN: 4.0000 mg | Freq: Four times a day (QID) | INTRAMUSCULAR | Status: DC | PRN

## 2012-11-26 MED ORDER — FUROSEMIDE 10 MG/ML IJ SOLN
40.0000 mg | Freq: Once | INTRAMUSCULAR | Status: AC
  Administered 2012-11-26: 40 mg via INTRAVENOUS

## 2012-11-26 MED ORDER — PANTOPRAZOLE SODIUM 40 MG PO TBEC
40.0000 mg | DELAYED_RELEASE_TABLET | Freq: Every day | ORAL | Status: DC
  Filled 2012-11-26 (×2): qty 1

## 2012-11-26 MED ORDER — IRBESARTAN 150 MG PO TABS
150.0000 mg | ORAL_TABLET | Freq: Every day | ORAL | Status: DC
  Administered 2012-11-29 – 2012-12-01 (×3): 150 mg via ORAL
  Filled 2012-11-26 (×7): qty 1

## 2012-11-26 MED ORDER — SODIUM CHLORIDE 0.9 % IV SOLN
250.0000 mL | INTRAVENOUS | Status: DC | PRN
  Administered 2012-11-27: 250 mL via INTRAVENOUS

## 2012-11-26 MED ORDER — CIPROFLOXACIN IN D5W 400 MG/200ML IV SOLN
400.0000 mg | Freq: Two times a day (BID) | INTRAVENOUS | Status: DC
  Administered 2012-11-26 – 2012-11-27 (×2): 400 mg via INTRAVENOUS
  Filled 2012-11-26 (×3): qty 200

## 2012-11-26 MED ORDER — ADULT MULTIVITAMIN W/MINERALS CH
1.0000 | ORAL_TABLET | Freq: Every day | ORAL | Status: DC
  Filled 2012-11-26 (×3): qty 1

## 2012-11-26 MED ORDER — CIPROFLOXACIN HCL 500 MG PO TABS
500.0000 mg | ORAL_TABLET | Freq: Two times a day (BID) | ORAL | Status: DC
  Filled 2012-11-26 (×5): qty 1

## 2012-11-26 MED ORDER — HYOSCYAMINE SULFATE 0.125 MG PO TABS
0.1250 mg | ORAL_TABLET | ORAL | Status: DC | PRN
  Filled 2012-11-26: qty 1

## 2012-11-26 MED ORDER — DEXTROSE 50 % IV SOLN
50.0000 mL | Freq: Once | INTRAVENOUS | Status: AC | PRN
  Administered 2012-11-26: 50 mL via INTRAVENOUS

## 2012-11-26 MED ORDER — INSULIN ASPART 100 UNIT/ML ~~LOC~~ SOLN
0.0000 [IU] | SUBCUTANEOUS | Status: DC
  Administered 2012-11-27: 4 [IU] via SUBCUTANEOUS
  Administered 2012-11-28: 11 [IU] via SUBCUTANEOUS

## 2012-11-26 MED ORDER — WARFARIN SODIUM 5 MG PO TABS
5.0000 mg | ORAL_TABLET | Freq: Every day | ORAL | Status: DC
  Filled 2012-11-26: qty 1

## 2012-11-26 MED ORDER — ATORVASTATIN CALCIUM 40 MG PO TABS
40.0000 mg | ORAL_TABLET | Freq: Every evening | ORAL | Status: DC
  Filled 2012-11-26 (×3): qty 1

## 2012-11-26 MED ORDER — POTASSIUM CHLORIDE 10 MEQ/100ML IV SOLN
10.0000 meq | INTRAVENOUS | Status: AC
  Administered 2012-11-26 (×2): 10 meq via INTRAVENOUS
  Filled 2012-11-26 (×4): qty 100

## 2012-11-26 MED ORDER — SENNOSIDES-DOCUSATE SODIUM 8.6-50 MG PO TABS
1.0000 | ORAL_TABLET | Freq: Two times a day (BID) | ORAL | Status: DC
  Administered 2012-11-26 – 2012-12-07 (×15): 1 via ORAL
  Filled 2012-11-26 (×25): qty 1

## 2012-11-26 MED ORDER — WARFARIN SODIUM 5 MG IV SOLR
5.0000 mg | Freq: Once | INTRAVENOUS | Status: AC
  Administered 2012-11-26: 5 mg via INTRAVENOUS
  Filled 2012-11-26: qty 2.5

## 2012-11-26 MED ORDER — DEXTROSE 50 % IV SOLN
INTRAVENOUS | Status: AC
  Administered 2012-11-26: 25 mL
  Filled 2012-11-26: qty 50

## 2012-11-26 MED ORDER — ZINC OXIDE 40 % EX OINT
1.0000 "application " | TOPICAL_OINTMENT | Freq: Every day | CUTANEOUS | Status: DC | PRN
  Filled 2012-11-26: qty 114

## 2012-11-26 MED ORDER — DULOXETINE HCL 60 MG PO CPEP
60.0000 mg | ORAL_CAPSULE | Freq: Every day | ORAL | Status: DC
  Administered 2012-11-29 – 2012-11-30 (×2): 60 mg via ORAL
  Filled 2012-11-26 (×6): qty 1

## 2012-11-26 MED ORDER — FUROSEMIDE 10 MG/ML IJ SOLN
20.0000 mg | Freq: Once | INTRAMUSCULAR | Status: DC
  Filled 2012-11-26: qty 4

## 2012-11-26 MED ORDER — PHENAZOPYRIDINE HCL 100 MG PO TABS
100.0000 mg | ORAL_TABLET | Freq: Three times a day (TID) | ORAL | Status: DC | PRN
  Filled 2012-11-26: qty 1

## 2012-11-26 NOTE — ED Notes (Signed)
Onalee Hua PA notified of critical CO2 42

## 2012-11-26 NOTE — ED Provider Notes (Signed)
  This was a shared visit with a mid-level provided (NP or PA).  Throughout the patient's course I was available for consultation/collaboration.  I saw the ECG (if appropriate), relevant labs and studies - I agree with the interpretation.  On my exam the patient was in no distress.    On my exam the patient was generally unwell appearing, though in no distress.  Given his recent hospitalization with prolonged his stay, his new pulmonary congestion visible on x-ray, his concern of dyspnea, cough, and concern for pneumonia, the patient was admitted for further evaluation and management.   Gerhard Munch, MD 11/26/12 1530

## 2012-11-26 NOTE — ED Notes (Signed)
Dr Gardner in room with pt.  

## 2012-11-26 NOTE — Progress Notes (Addendum)
Patient ID: Frederick [name], male   DOB: February 08, 1930, 79 y.o.   MRN: [id number] [facility] PROGRESS NOTE  Frederick [name] WUJ:[id number] DOB: [birth year] DOA: 11/25/2012 PCP: Georgianne Fick, MD  Brief narrative: Py is 79 yo male with a history of diabetes mellitus on insulin, hypertension, coronary artery disease, Permanent A. fib and history of PE on chronic Coumadin, GERD, history of peripheral vascular disease, history of pancreatic tumor status post resection and recent transurethral bladder tumor resection and left ureteral stent placed by Dr Margarita Grizzle on 11/03/2012 and discharged home with a foley.earlier in August. He now presented to [facility] ED with main concern of progressively worsening shortness of breath initially noted with exertion and has progressed to dyspnea and rest, 2 pillow orthopnea and slightly worsening LE edema. In ED, CXR consistent with pulmonary vascular congestion. [facility] asked to admit for further evaluation and management of ? Diastolic CHF exacerbation.   Please see earlier note by Dr. Julian Reil.  Principal Problem:   Pulmonary edema - will continue Lasix as already started in ED - will ask cardiologist for further recommendations - I will lower the dose of Lasix form 80 mg TID to 40 mg TID and will follow up on cardiology recommendations  - monitor I's and O's, daily weights - monitor renal function closely  Active Problems:   DIABETES MELLITUS, TYPE II, ON INSULIN - continue Insulin as per home medical regimen - also continue SSI   Atrial fibrillation, permanent - monitor on telemetry - continue Coumadin per pharmacy   Objective: Filed Vitals:   11/25/12 2149 11/26/12 0318  BP: 136/73 131/71  Pulse: 99 91  Temp: 98.4 F (36.9 C) 98 F (36.7 C)  TempSrc:  Oral  Resp: 22 20  Height:  5\' 8"  (1.727 m)  Weight:  98.34 kg (216 lb 12.8 oz)  SpO2: 94% 99%    Data Reviewed: Basic Metabolic Panel:  Recent Labs Lab 11/20/12 0532 11/26/12 0014  11/26/12 0218  NA 144 142  --   K 3.6 3.7  --   CL 98 95*  --   CO2 40* 42*  --   GLUCOSE 186* 71  --   BUN 20 18  --   CREATININE 1.13 1.06  --   CALCIUM 9.6 9.7  --   MG  --   --  1.9   CBC:  Recent Labs Lab 11/26/12 0014  WBC 17.0*  NEUTROABS 13.7*  HGB 11.4*  HCT 38.7*  MCV 100.8*  PLT 474*   Cardiac Enzymes:  Recent Labs Lab 11/26/12 0218 11/26/12 0810  TROPONINI <0.30 <0.30   Debbora Presto, MD  [facility] Pager [phone]  If 7PM-7AM, please contact night-coverage [email or web address] Password [email or web address] 11/26/2012, 10:00 AM   LOS: 1 day

## 2012-11-26 NOTE — Progress Notes (Signed)
BS 56.... Hypoglycemic protocol; initiated. Pt is asymptomatic

## 2012-11-26 NOTE — Evaluation (Addendum)
Clinical/Bedside Swallow Evaluation Patient Details  Name: Frederick [name] MRN: [id number] Date of Birth: March 01, 1930  Today's Date: 11/26/2012 Time: 4098-1191 SLP Time Calculation (min): 36 min  Past Medical History:  Past Medical History  Diagnosis Date  . Diabetes mellitus   . Hypertension   . Hypercholesterolemia   . Esophageal stricture   . Pulmonary embolism 2008    chronic coumadin   . Coronary artery disease     DR. BERRY IS PT'S CARDIOLOGIST  . Atrial fibrillation     CHRONIC COUMADIN-pemanent atrial fib  . Anxiety     SINCE 1975   . Depression     SINCE 1975    . Shortness of breath     NOT SURE WHAT CAUSES SOB - BUT HE IS NOT ABLE TO BE ACTIVE - AND IS SOB WITH ANY ACTIVITY - USES OXYGEN 2 L NASAL CANNULA - ALL THE TIME -EXCEPT WHEN IN SHOWER OR CHANGING CLOTHES on home 02  . Peripheral vascular disease     TOLD SOME SMALL AMOUNT OF BLOCKAGE IN LEG WHEN HEART CATH DONE 2008  . Bladder tumor     HAS HAD HEMATURIA OFF AND ON - HAS FOLEY CATH THAT WAS PLACED JUNE 20TH, 2014  . GERD (gastroesophageal reflux disease)   . Edema     FEET AND LEGS MOST DAYS - SOMETIMES WEARS COMPRESSION HOSE  . Cancer     SKIN CANCERS  . Arthritis     S/P BILATERAL TOTAL KNEE REPLACEMENTS - BUT BOTH KNEES PAINFUL AND JOINTS WORN OUT; BAD RIGHT HIP - BUT NOT A CANDIDATE FOR HIP PREPLACMENT;  HAS SEVERE LOWER  BACK AND LEG PAIN - HAS SPINAL STENOSIS AND BULGING DISC; CURVATURE OF UPPER SPINE - UNABLE TO LAY HIS HEAD FLAT.  Marland Kitchen Pneumonia 2005  . Difficult intubation     DUE TO LIMITED NECK FLEXION AND SHORT NECK--ANESTHESIA RECORD FROM 2007 VATS SURGERY AT [facility] OBTAINED AND ON PT'S CHART.  Marland Kitchen Problems with hearing     WEARS BILATERAL HEARING AIDS   Past Surgical History:  Past Surgical History  Procedure Laterality Date  . Shoulder surgery      bilateral  . Pancreas surgery  2001    TUMOR REMOVED FROM PANCREAS AND SPLEEN ALSO REMOVED  . Total knee arthroplasty      bilateral  . Lung  surgery  2007    non-cancer  VIDEO ASSISTED THORCOTOMY TO REMOVE FLUID / DECORTICATION. DR. Edwyna Shell  . Basil cell    . Cardiac catheterization  2008    totally occluded nondominant LCX wth mod. ostial RCA disease and nl. EF  . Esophageal stretch    . Skin cancer removed from left side of head - required skin graft from left leg    . Cartilage repair 198- left knee    . Joint replacement    . Transurethral resection of bladder tumor with gyrus (turbt-gyrus) N/A 11/03/2012    Procedure: TRANSURETHRAL RESECTION OF BLADDER TUMOR WITH GYRUS (TURBT-GYRUS);  Surgeon: Milford Cage, MD;  Location: [facility];  Service: Urology;  Laterality: N/A;  . Cystoscopy/retrograde/ureteroscopy  11/03/2012    Procedure: CYSTOSCOPY/RETROGRADE/ LEFT URETEROSCOPY AND STENT PLACEMENT;  Surgeon: Milford Cage, MD;  Location: [facility];  Service: Urology;;  . Theador Hawthorne N/A 11/03/2012    Procedure: CYSTOGRAM;  Surgeon: Milford Cage, MD;  Location: [facility];  Service: Urology;  Laterality: N/A;   HPI:  79 yo male adm to [facility] 8/21 with AMS, found to have pna.  PMH + for CAD, respiratory problems on oxygen at home 2 liters, GERD s/p esophageal dilatations, edema.  Pt recently admitted with UTI.  Pt per notes is a difficult intubation due to neck posture.  Wife present reports pt with poor intake, increased coughing with and without po intake, and frequently sleeping throughout the day.     Assessment / Plan / Recommendation Clinical Impression  Pt presents with indications concerning for oropharyngeal on top of known GERD/hiatal hernia issues.  Suspect pt's gross weakness and current cognitive difficulties contribute to dysphagia.  Spouse reports pt has been orally holding food at home, requiring someone to feed him and been having coughing with and without intake.  Skilled intervention includes educating pt and family to findings, recommendations, importance of alertness level with po and concerns.  Rec MBS due  to pt overtly coughing during intake concerning for aspirating.  Cough was not observed at all prior to po given-however pt was asleep.  Cannot rule out pharyngeal deficits at bedside.   Note pt has been sleeping a lot more at home per spouse sincee admitted to the hospital last week.      Aspiration Risk  Severe    Diet Recommendation NPO;NPO except meds        Other  Recommendations Recommended Consults: MBS   Follow Up Recommendations       Frequency and Duration        Pertinent Vitals/Pain Afebrile, decreased, WBC high    SLP Swallow Goals Patient will utilize recommended strategies during swallow to increase swallowing safety with: Total assistance   Swallow Study Prior Functional Status   Oral holding at home, pt needing to be fed, poor intake.      General Date of Onset: 11/26/12 HPI: 78 yo male adm to [facility] 8/21 with AMS, found to have pna.  PMH + for CAD, respiratory problems on oxygen at home 2 liters, GERD s/p esophageal dilatations, edema.  Pt recently admitted with UTI.  Pt per notes is a difficult intubation due to neck posture.  Wife present reports pt with poor intake, increased coughing with and without po intake, and frequently sleeping throughout the day.   Type of Study: Bedside swallow evaluation Previous Swallow Assessment: dilatations completed Diet Prior to this Study: NPO Temperature Spikes Noted: No Respiratory Status: Room air History of Recent Intubation: No Behavior/Cognition: Cooperative;Confused;Doesn't follow directions;Decreased sustained attention;Requires cueing;Hard of hearing;Lethargic (sleepy but participative) Oral Cavity - Dentition: Adequate natural dentition Self-Feeding Abilities: Total assist Patient Positioning: Upright in bed Baseline Vocal Quality: Low vocal intensity Volitional Cough: Strong Volitional Swallow: Able to elicit    Oral/Motor/Sensory Function Overall Oral Motor/Sensory Function:  (gross weakness)   Ice Chips Ice  chips: Impaired Oral Phase Impairments: Reduced lingual movement/coordination;Impaired anterior to posterior transit Oral Phase Functional Implications: Prolonged oral transit;Oral holding Pharyngeal Phase Impairments: Suspected delayed Swallow   Thin Liquid Thin Liquid: Impaired Presentation: Cup;Straw Oral Phase Impairments: Reduced lingual movement/coordination;Impaired anterior to posterior transit Pharyngeal  Phase Impairments: Cough - Immediate;Throat Clearing - Delayed;Decreased hyoid-laryngeal movement;Multiple swallows    Nectar Thick Nectar Thick Liquid: Impaired Presentation: Cup;Spoon;Straw Oral Phase Impairments: Reduced lingual movement/coordination;Impaired anterior to posterior transit Pharyngeal Phase Impairments: Suspected delayed Swallow;Decreased hyoid-laryngeal movement;Cough - Delayed;Multiple swallows   Honey Thick Honey Thick Liquid: Not tested   Puree Puree: Impaired Presentation: Self Fed;Spoon Oral Phase Impairments: Reduced lingual movement/coordination;Impaired anterior to posterior transit Oral Phase Functional Implications: Prolonged oral transit Pharyngeal Phase Impairments: Decreased hyoid-laryngeal movement;Multiple swallows   Solid  GO    Solid: Impaired Oral Phase Impairments: Impaired anterior to posterior transit;Reduced lingual movement/coordination Oral Phase Functional Implications: Oral holding;Oral residue Pharyngeal Phase Impairments: Suspected delayed Swallow;Cough - Immediate       Donavan Burnet, MS [facility] SLP [phone]    SLP paged MD for order for MBS, no order received as of 1505. Will have SLP follow up next date.  TK

## 2012-11-26 NOTE — Progress Notes (Signed)
ANTICOAGULATION CONSULT NOTE - Follow Up Consult  Pharmacy Consult for Coumadin Indication: h/o pulmonary embolism, afib  Allergies  Allergen Reactions  . Hydromorphone Hcl Other (See Comments)    Dilaudid caused Confusion   . Morphine Other (See Comments)    confusion  . Pregabalin Other (See Comments)    lyrica - caused hallucinations    Labs:  Recent Labs  11/26/12 0014 11/26/12 0218 11/26/12 0353  HGB 11.4*  --   --   HCT 38.7*  --   --   PLT 474*  --   --   LABPROT  --   --  25.2*  INR  --   --  2.38*  CREATININE 1.06  --   --   TROPONINI  --  <0.30  --     Assessment: 83 yom recently discharged from [facility] 8/16 for sepsis 2/2 pseudomonas UTI presented 8/21 with progressive SOB, productive cough. Patient with h/o afib and PE on chronic Coumadin. Patient was reportedly on Coumadin 5mg  po daily with LD 8/21  INR 2.38 earlier this AM. CBC okay, no bleeding  DDI with Ciprofloxacin noted - patient was started on Ciprofloxacin 8/16, anticipated to continue until 8/26.   Goal of Therapy:  INR 2-3 Monitor platelets by anticoagulation protocol: Yes   Plan:   Coumadin 5mg  daily as per home regimen given therapeutic INR  Daily PT/INR  Pharmacy will f/u daily   Geoffry Paradise, PharmD, BCPS Pager: [phone] 8:12 AM Pharmacy #: [phone]

## 2012-11-26 NOTE — Progress Notes (Signed)
ANTICOAGULATION CONSULT NOTE - Initial Consult  Pharmacy Consult for Warfarin Indication: A-fib/hx PE  Allergies  Allergen Reactions  . Hydromorphone Hcl Other (See Comments)    Dilaudid caused Confusion   . Morphine Other (See Comments)    confusion  . Pregabalin Other (See Comments)    lyrica - caused hallucinations    Patient Measurements:   Wt=106 kg  Vital Signs: Temp: 98.4 F (36.9 C) (08/21 2149) BP: 136/73 mmHg (08/21 2149) Pulse Rate: 99 (08/21 2149)  Labs:  Recent Labs  11/26/12 0014  HGB 11.4*  HCT 38.7*  PLT 474*  CREATININE 1.06    The CrCl is unknown because both a height and weight (above a minimum accepted value) are required for this calculation.   Medical History: Past Medical History  Diagnosis Date  . Diabetes mellitus   . Hypertension   . Hypercholesterolemia   . Esophageal stricture   . Pulmonary embolism 2008    chronic coumadin   . Coronary artery disease     DR. BERRY IS PT'S CARDIOLOGIST  . Atrial fibrillation     CHRONIC COUMADIN-pemanent atrial fib  . Anxiety     SINCE 1975   . Depression     SINCE 1975    . Shortness of breath     NOT SURE WHAT CAUSES SOB - BUT HE IS NOT ABLE TO BE ACTIVE - AND IS SOB WITH ANY ACTIVITY - USES OXYGEN 2 L NASAL CANNULA - ALL THE TIME -EXCEPT WHEN IN SHOWER OR CHANGING CLOTHES on home 02  . Peripheral vascular disease     TOLD SOME SMALL AMOUNT OF BLOCKAGE IN LEG WHEN HEART CATH DONE 2008  . Bladder tumor     HAS HAD HEMATURIA OFF AND ON - HAS FOLEY CATH THAT WAS PLACED JUNE 20TH, 2014  . GERD (gastroesophageal reflux disease)   . Edema     FEET AND LEGS MOST DAYS - SOMETIMES WEARS COMPRESSION HOSE  . Cancer     SKIN CANCERS  . Arthritis     S/P BILATERAL TOTAL KNEE REPLACEMENTS - BUT BOTH KNEES PAINFUL AND JOINTS WORN OUT; BAD RIGHT HIP - BUT NOT A CANDIDATE FOR HIP PREPLACMENT;  HAS SEVERE LOWER  BACK AND LEG PAIN - HAS SPINAL STENOSIS AND BULGING DISC; CURVATURE OF UPPER SPINE - UNABLE  TO LAY HIS HEAD FLAT.  Marland Kitchen Pneumonia 2005  . Difficult intubation     DUE TO LIMITED NECK FLEXION AND SHORT NECK--ANESTHESIA RECORD FROM 2007 VATS SURGERY AT [facility] OBTAINED AND ON PT'S CHART.  Marland Kitchen Problems with hearing     WEARS BILATERAL HEARING AIDS    Medications:  Scheduled:  . atorvastatin  40 mg Oral QPM  . chlordiazePOXIDE  10 mg Oral TID  . ciprofloxacin  500 mg Oral BID  . DULoxetine  60 mg Oral QHS  . furosemide  80 mg Intravenous Q8H  . insulin aspart  0-20 Units Subcutaneous Q4H  . irbesartan  150 mg Oral Q breakfast  . isosorbide mononitrate  60 mg Oral Q supper  . pantoprazole  40 mg Oral Daily  . potassium chloride SA  20 mEq Oral Daily  . potassium chloride  40 mEq Oral Once  . senna-docusate  1 tablet Oral BID  . sodium chloride  3 mL Intravenous Q12H   Infusions:  . sodium chloride      Assessment: 79 yo c/o cough on chronic coumadin for A-fib and hx of PE. Home regimen is 5mg  daily. LD 8/21  Goal of Therapy:  INR 2-3    Plan:   Daily PT/INR  Education  Susanne Greenhouse R 11/26/2012,2:17 AM

## 2012-11-26 NOTE — Progress Notes (Signed)
8/22  CBGs  57-183-82-67/122 mg/dl Patient with low CBGs on admission.  Patient takes Lantus 46 units daily and Humalog 2-4 units sliding scale with breakfast for home regimen.  Recommend that when patient's blood sugars are more within normal range, start Lantus 20 units (half of home dosage and 0.2 units/kg.based on weight)  and Novolog SENSITIVE correction scale every 4 hours if NPO and AC & HS when eating.  Will continue to follow while in hospital.  Smith Mince RN BSN CDE

## 2012-11-26 NOTE — Consult Note (Signed)
Reason for Consult:Pulmonary edema  Referring Physician: Dr. Julian Reil PCP: Georgianne Fick, MD Primary Cardiologist:  Frederick [name] is an 79 y.o. male.    Chief Complaint: admitted today with progressive SOB, cough with white sputum,  just discharged 6 days ago after an admission for a UTI with pseudomonas.  HPI: 79 year old obese male with a history of diabetes mellitus on insulin, hypertension, coronary artery disease, Permanent A. fib and history of PE on chronic Coumadin, GERD, history of peripheral vascular disease, depression, hard of hearing, history of pancreatic tumor status post resection and his recent transurethral bladder tumor resection and left ureteral stent placed by Dr Margarita Grizzle on 11/03/2012 and discharged home with a foley.earlier in August.  Was readmitted with sudden onset chills and shaking.  At baseline the patient has shortness of breath with exertion and is on 2 L oxygen via nasal cannula at home. He is wheelchair bound due to rt hip fracture and limited mobilityand has off-and-on leg edema.  Treated for sepsis and discharged 11/20/12.  Since discharge he has getting progressively short of breath until today he developed cough, productive of a whitish sputum. He has had no fever, no chills, no sweating since Monday of this week. In the ED while his WBC is elevated at 17k this is significantly down from the >30k it was at discharge 6 days ago. At the time of discharge he had been taken off of his Zaroxylyn and had not yet restarted this, he did restart his lasix on Tuesday.   In the ED CXR demonstrated pulmonary edema, and was suggestive of a L sided pleural effusion, there was some question of wether or not he had L sided atelectasis vs infiltrate. He was given Lasix IV and hospitalist asked to admit for decompensation of heart failure. INR 2.38.  No EKG.    Past Medical History  Diagnosis Date  . Diabetes mellitus   . Hypertension   . Hypercholesterolemia     . Esophageal stricture   . Pulmonary embolism 2008    chronic coumadin   . Coronary artery disease     DR. BERRY IS PT'S CARDIOLOGIST  . Atrial fibrillation     CHRONIC COUMADIN-pemanent atrial fib  . Anxiety     SINCE 1975   . Depression     SINCE 1975    . Shortness of breath     NOT SURE WHAT CAUSES SOB - BUT HE IS NOT ABLE TO BE ACTIVE - AND IS SOB WITH ANY ACTIVITY - USES OXYGEN 2 L NASAL CANNULA - ALL THE TIME -EXCEPT WHEN IN SHOWER OR CHANGING CLOTHES on home 02  . Peripheral vascular disease     TOLD SOME SMALL AMOUNT OF BLOCKAGE IN LEG WHEN HEART CATH DONE 2008  . Bladder tumor     HAS HAD HEMATURIA OFF AND ON - HAS FOLEY CATH THAT WAS PLACED JUNE 20TH, 2014  . GERD (gastroesophageal reflux disease)   . Edema     FEET AND LEGS MOST DAYS - SOMETIMES WEARS COMPRESSION HOSE  . Cancer     SKIN CANCERS  . Arthritis     S/P BILATERAL TOTAL KNEE REPLACEMENTS - BUT BOTH KNEES PAINFUL AND JOINTS WORN OUT; BAD RIGHT HIP - BUT NOT A CANDIDATE FOR HIP PREPLACMENT;  HAS SEVERE LOWER  BACK AND LEG PAIN - HAS SPINAL STENOSIS AND BULGING DISC; CURVATURE OF UPPER SPINE - UNABLE TO LAY HIS HEAD FLAT.  Marland Kitchen Pneumonia 2005  . Difficult intubation  DUE TO LIMITED NECK FLEXION AND SHORT NECK--ANESTHESIA RECORD FROM 2007 VATS SURGERY AT [facility] OBTAINED AND ON PT'S CHART.  Marland Kitchen Problems with hearing     WEARS BILATERAL HEARING AIDS    Past Surgical History  Procedure Laterality Date  . Shoulder surgery      bilateral  . Pancreas surgery  2001    TUMOR REMOVED FROM PANCREAS AND SPLEEN ALSO REMOVED  . Total knee arthroplasty      bilateral  . Lung surgery  2007    non-cancer  VIDEO ASSISTED THORCOTOMY TO REMOVE FLUID / DECORTICATION. DR. Edwyna Shell  . Basil cell    . Cardiac catheterization  2008    totally occluded nondominant LCX wth mod. ostial RCA disease and nl. EF  . Esophageal stretch    . Skin cancer removed from left side of head - required skin graft from left leg    . Cartilage  repair 198- left knee    . Joint replacement    . Transurethral resection of bladder tumor with gyrus (turbt-gyrus) N/A 11/03/2012    Procedure: TRANSURETHRAL RESECTION OF BLADDER TUMOR WITH GYRUS (TURBT-GYRUS);  Surgeon: Milford Cage, MD;  Location: [facility];  Service: Urology;  Laterality: N/A;  . Cystoscopy/retrograde/ureteroscopy  11/03/2012    Procedure: CYSTOSCOPY/RETROGRADE/ LEFT URETEROSCOPY AND STENT PLACEMENT;  Surgeon: Milford Cage, MD;  Location: [facility];  Service: Urology;;  . Theador Hawthorne N/A 11/03/2012    Procedure: CYSTOGRAM;  Surgeon: Milford Cage, MD;  Location: [facility];  Service: Urology;  Laterality: N/A;    Family History  Problem Relation Age of Onset  . Heart disease Mother   . Melanoma Father    Social History:  reports that he has never smoked. He has never used smokeless tobacco. He reports that he does not drink alcohol or use illicit drugs.  Allergies:  Allergies  Allergen Reactions  . Hydromorphone Hcl Other (See Comments)    Dilaudid caused Confusion   . Morphine Other (See Comments)    confusion  . Pregabalin Other (See Comments)    lyrica - caused hallucinations    Medications Prior to Admission  Medication Sig Dispense Refill  . atorvastatin (LIPITOR) 40 MG tablet Take 40 mg by mouth every evening.        . chlordiazePOXIDE (LIBRIUM) 10 MG capsule Take 10 mg by mouth 3 (three) times daily.        . ciprofloxacin (CIPRO) 500 MG tablet Take 1 tablet (500 mg total) by mouth 2 (two) times daily.  20 tablet  0  . DULoxetine (CYMBALTA) 60 MG capsule Take 60 mg by mouth at bedtime.        . furosemide (LASIX) 40 MG tablet Take 40-80 mg by mouth 2 (two) times daily. 2 tablets at breakfast and 1 at dinner      . glipiZIDE (GLUCOTROL) 10 MG tablet Take 10 mg by mouth daily.       Marland Kitchen HYDROcodone-acetaminophen (NORCO/VICODIN) 5-325 MG per tablet Take 1-2 tablets by mouth every 4 (four) hours as needed for pain.  30 tablet  0  . hyoscyamine  (LEVSIN, ANASPAZ) 0.125 MG tablet Take 1 tablet (0.125 mg total) by mouth every 4 (four) hours as needed for cramping (bladder spasms).  40 tablet  4  . insulin glargine (LANTUS) 100 UNIT/ML injection Inject 46 Units into the skin daily. Daily with breakfast      . insulin lispro (HUMALOG) 100 UNIT/ML injection Inject 2-4 Units into the skin daily before breakfast.  Sliding scale      . isosorbide mononitrate (IMDUR) 60 MG 24 hr tablet Take 60 mg by mouth daily with supper.       . lansoprazole (PREVACID) 30 MG capsule Take 30 mg by mouth daily.      Marland Kitchen liver oil-zinc oxide (DESITIN) 40 % ointment Apply 1 application topically daily as needed for dry skin. Apply to bottom      . phenazopyridine (PYRIDIUM) 100 MG tablet Take 1 tablet (100 mg total) by mouth every 8 (eight) hours as needed for pain (Burning urination.  Will turn urine and body fluids orange.).  30 tablet  1  . potassium chloride SA (K-DUR,KLOR-CON) 20 MEQ tablet Take 1 tablet (20 mEq total) by mouth daily.      Marland Kitchen senna-docusate (SENOKOT S) 8.6-50 MG per tablet Take 1 tablet by mouth 2 (two) times daily.  60 tablet  0  . warfarin (COUMADIN) 5 MG tablet Take 5 mg by mouth daily.       . irbesartan (AVAPRO) 150 MG tablet Take 150 mg by mouth daily with breakfast. -HOLD UNTIL FOLLOW UP WITH PRIMARY CARE DOCTOR      . metolazone (ZAROXOLYN) 5 MG tablet Take 1.5 tablets (7.5 mg total) by mouth daily. -HOLD UNTIL FOLLOW UP WITH PRIMARY CARE DOCTOR        Results for orders placed during the hospital encounter of 11/25/12 (from the past 48 hour(s))  CBC WITH DIFFERENTIAL     Status: Abnormal   Collection Time    11/26/12 12:14 AM      Result Value Range   WBC 17.0 (*) 4.0 - 10.5 K/uL   RBC 3.84 (*) 4.22 - 5.81 MIL/uL   Hemoglobin 11.4 (*) 13.0 - 17.0 g/dL   HCT 21.3 (*) 08.6 - 57.8 %   MCV 100.8 (*) 78.0 - 100.0 fL   MCH 29.7  26.0 - 34.0 pg   MCHC 29.5 (*) 30.0 - 36.0 g/dL   RDW 46.9  62.9 - 52.8 %   Platelets 474 (*) 150 - 400 K/uL    Neutrophils Relative % 81 (*) 43 - 77 %   Neutro Abs 13.7 (*) 1.7 - 7.7 K/uL   Lymphocytes Relative 12  12 - 46 %   Lymphs Abs 2.0  0.7 - 4.0 K/uL   Monocytes Relative 6  3 - 12 %   Monocytes Absolute 1.0  0.1 - 1.0 K/uL   Eosinophils Relative 2  0 - 5 %   Eosinophils Absolute 0.3  0.0 - 0.7 K/uL   Basophils Relative 0  0 - 1 %   Basophils Absolute 0.0  0.0 - 0.1 K/uL  BASIC METABOLIC PANEL     Status: Abnormal   Collection Time    11/26/12 12:14 AM      Result Value Range   Sodium 142  135 - 145 mEq/L   Potassium 3.7  3.5 - 5.1 mEq/L   Chloride 95 (*) 96 - 112 mEq/L   CO2 42 (*) 19 - 32 mEq/L   Comment: CRITICAL RESULT CALLED TO, READ BACK BY AND VERIFIED WITH:     CAMPBELL,T/ED @0055  ON 11/26/12 BY KARCZEWSKI,S.   Glucose, Bld 71  70 - 99 mg/dL   BUN 18  6 - 23 mg/dL   Creatinine, Ser 4.13  0.50 - 1.35 mg/dL   Calcium 9.7  8.4 - 24.4 mg/dL   GFR calc non Af Amer 64 (*) >90 mL/min   GFR calc Af Amer 74 (*) >  90 mL/min   Comment: (NOTE)     The eGFR has been calculated using the CKD EPI equation.     This calculation has not been validated in all clinical situations.     eGFR's persistently <90 mL/min signify possible Chronic Kidney     Disease.  TROPONIN I     Status: None   Collection Time    11/26/12  2:18 AM      Result Value Range   Troponin I <0.30  <0.30 ng/mL   Comment:            Due to the release kinetics of cTnI,     a negative result within the first hours     of the onset of symptoms does not rule out     myocardial infarction with certainty.     If myocardial infarction is still suspected,     repeat the test at appropriate intervals.  MAGNESIUM     Status: None   Collection Time    11/26/12  2:18 AM      Result Value Range   Magnesium 1.9  1.5 - 2.5 mg/dL  PROTIME-INR     Status: Abnormal   Collection Time    11/26/12  3:53 AM      Result Value Range   Prothrombin Time 25.2 (*) 11.6 - 15.2 seconds   INR 2.38 (*) 0.00 - 1.49  GLUCOSE, CAPILLARY      Status: Abnormal   Collection Time    11/26/12  4:06 AM      Result Value Range   Glucose-Capillary 57 (*) 70 - 99 mg/dL  MRSA PCR SCREENING     Status: Abnormal   Collection Time    11/26/12  4:51 AM      Result Value Range   MRSA by PCR POSITIVE (*) NEGATIVE   Comment:            The GeneXpert MRSA Assay (FDA     approved for NASAL specimens     only), is one component of a     comprehensive MRSA colonization     surveillance program. It is not     intended to diagnose MRSA     infection nor to guide or     monitor treatment for     MRSA infections.     RESULT CALLED TO, READ BACK BY AND VERIFIED WITH:     A. MOORE RN AT 1610 ON 08.22.14 BY SHUEA  GLUCOSE, CAPILLARY     Status: Abnormal   Collection Time    11/26/12  5:04 AM      Result Value Range   Glucose-Capillary 183 (*) 70 - 99 mg/dL  GLUCOSE, CAPILLARY     Status: None   Collection Time    11/26/12  7:49 AM      Result Value Range   Glucose-Capillary 82  70 - 99 mg/dL   Comment 1 Documented in Chart     Comment 2 Notify RN    TROPONIN I     Status: None   Collection Time    11/26/12  8:10 AM      Result Value Range   Troponin I <0.30  <0.30 ng/mL   Comment:            Due to the release kinetics of cTnI,     a negative result within the first hours     of the onset of symptoms does not rule out  myocardial infarction with certainty.     If myocardial infarction is still suspected,     repeat the test at appropriate intervals.  GLUCOSE, CAPILLARY     Status: Abnormal   Collection Time    11/26/12 11:47 AM      Result Value Range   Glucose-Capillary 67 (*) 70 - 99 mg/dL   Comment 1 Documented in Chart     Comment 2 Notify RN    GLUCOSE, CAPILLARY     Status: Abnormal   Collection Time    11/26/12 12:23 PM      Result Value Range   Glucose-Capillary 122 (*) 70 - 99 mg/dL   Comment 1 Documented in Chart     Comment 2 Notify RN    TROPONIN I     Status: None   Collection Time    11/26/12  1:49 PM       Result Value Range   Troponin I <0.30  <0.30 ng/mL   Comment:            Due to the release kinetics of cTnI,     a negative result within the first hours     of the onset of symptoms does not rule out     myocardial infarction with certainty.     If myocardial infarction is still suspected,     repeat the test at appropriate intervals.   Dg Chest 2 View  11/25/2012   *RADIOLOGY REPORT*  Clinical Data: Cough and chest congestion.  CHEST - 2 VIEW  Comparison: 11/15/2012  Findings: Shallow inspiration.  Mild cardiac enlargement with pulmonary vascular congestion and central interstitial changes suggesting edema.  This is progressing since the previous study. There is superimposed infiltration or atelectasis in the left lung base.  Small left pleural effusion is not excluded.  No pneumothorax.  Calcified and tortuous aorta.  Degenerative changes in the spine and shoulders.  Surgical clips in the left upper quadrant.  Left ureteral stent.  IMPRESSION: Cardiac enlargement with developing pulmonary vascular congestion and perihilar edema.  Infiltration or atelectasis in the left lung base.  Possible left pleural effusion.   Original Report Authenticated By: Burman Nieves, M.D.    ROS: General:no colds or fevers, no weight changes Skin:no rashes or ulcers HEENT:no blurred vision, no congestion CV:see HPI PUL:see HPI GI:no diarrhea constipation or melena, no indigestion GU:no hematuria, no dysuria, + foley MS:no joint pain, no claudication Neuro:no syncope, no lightheadedness Endo:+ diabetes, no thyroid disease   Blood pressure 131/71, pulse 91, temperature 98 F (36.7 C), temperature source Oral, resp. rate 20, height 5\' 8"  (1.727 m), weight 216 lb 12.8 oz (98.34 kg), SpO2 99.00%. PE: General:Pleasant affect, NAD, hard of hearing  Skin:Warm and dry, brisk capillary refill HEENT:normocephalic, sclera clear, mucus membranes moist Neck:supple, no JVD, no bruits  Heart:irreg irreg with  2/6 systolic murmur, no gallup, rub or click Lungs:decreased breath sounds,  without rales, rhonchi, or wheezes BMW:UXLKG, soft, non tender, + BS, do not palpate liver spleen or masses Ext:no lower ext edema,  2+ radial pulses Neuro:alert has trouble hearing, not answering questions., MAE, follows some commands, + facial symmetry    Assessment/Plan Principal Problem:   Pulmonary edema Active Problems:   DIABETES MELLITUS, TYPE II, ON INSULIN   PLEURAL EFFUSION, LEFT   Atrial fibrillation, permanent  PLAN: Continue IV Lasix., see Dr. Hazle Coca note   [id number] R  Nurse Practitioner Certified [facility] Pager [phone] 11/26/2012, 3:31 PM  Agree with note written by Nada Boozer RNP  Pt just D/Cd home a week ago after hospitalization for urosepsis. He has nl LV fn by 2D. He was D/Cd off of his Zaroxolyn and has had progressive increased SOB and pulm edema on CXR. Exam c/w this. Rec cont IV diuresis and D/C home on more aggressive PO diuretic regimen. He is clearly somewhat debilitated and to weak to go home. I suspect he will require at least short term SNF.  Runell Gess 11/26/2012 6:57 PM

## 2012-11-26 NOTE — H&P (Addendum)
[facility] History and Physical  Taft Worthing ZOX:[id number] DOB: 03-03-1930 DOA: 11/25/2012  Referring physician: ED PCP: Georgianne Fick, MD   Chief Complaint: Cough  HPI: Frederick [name] is a 79 y.o. male who was just discharged from our service 6 days ago after an admission for a UTI with pseudomonas.  Since discharge he has getting progressively short of breath until today he developed cough, productive of a whitish sputum.  He has had no fever, no chills, no sweating since Monday of this week.  In the ED while his WBC is elevated at 17k this is significantly down from the >30k it was at discharge 6 days ago.  At the time of discharge he had been taken off of his Zaroxylyn and had not yet restarted this, he did restart his lasix on Tuesday.  In the ED CXR demonstrated pulmonary edema, and was suggestive of a L sided pleural effusion, there was some question of wether or not he had L sided atelectasis vs infiltrate.  He was given Lasix IV and hospitalist asked to admit for decompensation of heart failure.  Review of Systems: 12 systems reviewed and otherwise negative.  Past Medical History  Diagnosis Date  . Diabetes mellitus   . Hypertension   . Hypercholesterolemia   . Esophageal stricture   . Pulmonary embolism 2008    chronic coumadin   . Coronary artery disease     DR. BERRY IS PT'S CARDIOLOGIST  . Atrial fibrillation     CHRONIC COUMADIN-pemanent atrial fib  . Anxiety     SINCE 1975   . Depression     SINCE 1975    . Shortness of breath     NOT SURE WHAT CAUSES SOB - BUT HE IS NOT ABLE TO BE ACTIVE - AND IS SOB WITH ANY ACTIVITY - USES OXYGEN 2 L NASAL CANNULA - ALL THE TIME -EXCEPT WHEN IN SHOWER OR CHANGING CLOTHES on home 02  . Peripheral vascular disease     TOLD SOME SMALL AMOUNT OF BLOCKAGE IN LEG WHEN HEART CATH DONE 2008  . Bladder tumor     HAS HAD HEMATURIA OFF AND ON - HAS FOLEY CATH THAT WAS PLACED JUNE 20TH, 2014  . GERD (gastroesophageal  reflux disease)   . Edema     FEET AND LEGS MOST DAYS - SOMETIMES WEARS COMPRESSION HOSE  . Cancer     SKIN CANCERS  . Arthritis     S/P BILATERAL TOTAL KNEE REPLACEMENTS - BUT BOTH KNEES PAINFUL AND JOINTS WORN OUT; BAD RIGHT HIP - BUT NOT A CANDIDATE FOR HIP PREPLACMENT;  HAS SEVERE LOWER  BACK AND LEG PAIN - HAS SPINAL STENOSIS AND BULGING DISC; CURVATURE OF UPPER SPINE - UNABLE TO LAY HIS HEAD FLAT.  Marland Kitchen Pneumonia 2005  . Difficult intubation     DUE TO LIMITED NECK FLEXION AND SHORT NECK--ANESTHESIA RECORD FROM 2007 VATS SURGERY AT [facility] OBTAINED AND ON PT'S CHART.  Marland Kitchen Problems with hearing     WEARS BILATERAL HEARING AIDS   Past Surgical History  Procedure Laterality Date  . Shoulder surgery      bilateral  . Pancreas surgery  2001    TUMOR REMOVED FROM PANCREAS AND SPLEEN ALSO REMOVED  . Total knee arthroplasty      bilateral  . Lung surgery  2007    non-cancer  VIDEO ASSISTED THORCOTOMY TO REMOVE FLUID / DECORTICATION. DR. Edwyna Shell  . Basil cell    . Cardiac catheterization  2008    totally occluded  nondominant LCX wth mod. ostial RCA disease and nl. EF  . Esophageal stretch    . Skin cancer removed from left side of head - required skin graft from left leg    . Cartilage repair 198- left knee    . Joint replacement    . Transurethral resection of bladder tumor with gyrus (turbt-gyrus) N/A 11/03/2012    Procedure: TRANSURETHRAL RESECTION OF BLADDER TUMOR WITH GYRUS (TURBT-GYRUS);  Surgeon: Milford Cage, MD;  Location: [facility];  Service: Urology;  Laterality: N/A;  . Cystoscopy/retrograde/ureteroscopy  11/03/2012    Procedure: CYSTOSCOPY/RETROGRADE/ LEFT URETEROSCOPY AND STENT PLACEMENT;  Surgeon: Milford Cage, MD;  Location: [facility];  Service: Urology;;  . Theador Hawthorne N/A 11/03/2012    Procedure: CYSTOGRAM;  Surgeon: Milford Cage, MD;  Location: [facility];  Service: Urology;  Laterality: N/A;   Social History:  reports that he has never smoked. He has never  used smokeless tobacco. He reports that he does not drink alcohol or use illicit drugs.   Allergies  Allergen Reactions  . Hydromorphone Hcl Other (See Comments)    Dilaudid caused Confusion   . Morphine Other (See Comments)    confusion  . Pregabalin Other (See Comments)    lyrica - caused hallucinations    Family History  Problem Relation Age of Onset  . Heart disease Mother   . Melanoma Father     Prior to Admission medications   Medication Sig Start Date End Date Taking? Authorizing Provider  atorvastatin (LIPITOR) 40 MG tablet Take 40 mg by mouth every evening.     Yes Historical Provider, MD  chlordiazePOXIDE (LIBRIUM) 10 MG capsule Take 10 mg by mouth 3 (three) times daily.     Yes Historical Provider, MD  ciprofloxacin (CIPRO) 500 MG tablet Take 1 tablet (500 mg total) by mouth 2 (two) times daily. 11/20/12 11/30/12 Yes Vassie Loll, MD  DULoxetine (CYMBALTA) 60 MG capsule Take 60 mg by mouth at bedtime.     Yes Historical Provider, MD  furosemide (LASIX) 40 MG tablet Take 40-80 mg by mouth 2 (two) times daily. 2 tablets at breakfast and 1 at dinner 11/20/12  Yes Vassie Loll, MD  glipiZIDE (GLUCOTROL) 10 MG tablet Take 10 mg by mouth daily.    Yes Historical Provider, MD  HYDROcodone-acetaminophen (NORCO/VICODIN) 5-325 MG per tablet Take 1-2 tablets by mouth every 4 (four) hours as needed for pain. 11/20/12  Yes Vassie Loll, MD  hyoscyamine (LEVSIN, ANASPAZ) 0.125 MG tablet Take 1 tablet (0.125 mg total) by mouth every 4 (four) hours as needed for cramping (bladder spasms). 11/03/12  Yes Milford Cage, MD  insulin glargine (LANTUS) 100 UNIT/ML injection Inject 46 Units into the skin daily. Daily with breakfast   Yes Historical Provider, MD  insulin lispro (HUMALOG) 100 UNIT/ML injection Inject 2-4 Units into the skin daily before breakfast. Sliding scale   Yes Historical Provider, MD  isosorbide mononitrate (IMDUR) 60 MG 24 hr tablet Take 60 mg by mouth daily with  supper.    Yes Historical Provider, MD  lansoprazole (PREVACID) 30 MG capsule Take 30 mg by mouth daily. 02/14/11 11/15/13 Yes Louis Meckel, MD  liver oil-zinc oxide (DESITIN) 40 % ointment Apply 1 application topically daily as needed for dry skin. Apply to bottom   Yes Historical Provider, MD  phenazopyridine (PYRIDIUM) 100 MG tablet Take 1 tablet (100 mg total) by mouth every 8 (eight) hours as needed for pain (Burning urination.  Will turn urine and body  fluids orange.). 11/03/12  Yes Milford Cage, MD  potassium chloride SA (K-DUR,KLOR-CON) 20 MEQ tablet Take 1 tablet (20 mEq total) by mouth daily. 11/20/12  Yes Vassie Loll, MD  senna-docusate (SENOKOT S) 8.6-50 MG per tablet Take 1 tablet by mouth 2 (two) times daily. 11/03/12  Yes Milford Cage, MD  warfarin (COUMADIN) 5 MG tablet Take 5 mg by mouth daily.    Yes Historical Provider, MD  irbesartan (AVAPRO) 150 MG tablet Take 150 mg by mouth daily with breakfast. -HOLD UNTIL FOLLOW UP WITH PRIMARY CARE DOCTOR 11/20/12   Vassie Loll, MD  metolazone (ZAROXOLYN) 5 MG tablet Take 1.5 tablets (7.5 mg total) by mouth daily. -HOLD UNTIL FOLLOW UP WITH PRIMARY CARE DOCTOR 11/20/12   Vassie Loll, MD   Physical Exam: Filed Vitals:   11/25/12 2149  BP: 136/73  Pulse: 99  Temp: 98.4 F (36.9 C)  Resp: 22     General:  NAD, resting comfortably in bed  Eyes: PEERLA EOMI  ENT: mucous membranes moist  Neck: supple w/o JVD  Cardiovascular: RRR w/o MRG  Respiratory: Few crackles diffusely in lungs  Abdomen: soft, nt, nd, bs+  Skin: no rash nor lesion  Musculoskeletal: MAE, full ROM all 4 extremities  Psychiatric: normal tone and affect  Neurologic: AAOx3, not talking much but per his wife at bedside this is not new since discharge (not acute alteration) and he is currently at his baseline mental status for some time now.   Labs on Admission:  Basic Metabolic Panel:  Recent Labs Lab 11/19/12 0500 11/20/12 0532  11/26/12 0014  NA 144 144 142  K 2.8* 3.6 3.7  CL 98 98 95*  CO2 41* 40* 42*  GLUCOSE 192* 186* 71  BUN 18 20 18   CREATININE 1.26 1.13 1.06  CALCIUM 9.7 9.6 9.7   Liver Function Tests: No results found for this basename: AST, ALT, ALKPHOS, BILITOT, PROT, ALBUMIN,  in the last 168 hours No results found for this basename: LIPASE, AMYLASE,  in the last 168 hours No results found for this basename: AMMONIA,  in the last 168 hours CBC:  Recent Labs Lab 11/19/12 0500 11/26/12 0014  WBC 14.8* 17.0*  NEUTROABS  --  13.7*  HGB 9.9* 11.4*  HCT 33.0* 38.7*  MCV 100.3* 100.8*  PLT 354 474*   Cardiac Enzymes: No results found for this basename: CKTOTAL, CKMB, CKMBINDEX, TROPONINI,  in the last 168 hours  BNP (last 3 results)  Recent Labs  11/15/12 1926  PROBNP 663.1*   CBG:  Recent Labs Lab 11/19/12 1154 11/19/12 1649 11/19/12 2111 11/20/12 0718 11/20/12 1159  GLUCAP 177* 174* 195* 158* 206*    Radiological Exams on Admission: Dg Chest 2 View  11/25/2012   *RADIOLOGY REPORT*  Clinical Data: Cough and chest congestion.  CHEST - 2 VIEW  Comparison: 11/15/2012  Findings: Shallow inspiration.  Mild cardiac enlargement with pulmonary vascular congestion and central interstitial changes suggesting edema.  This is progressing since the previous study. There is superimposed infiltration or atelectasis in the left lung base.  Small left pleural effusion is not excluded.  No pneumothorax.  Calcified and tortuous aorta.  Degenerative changes in the spine and shoulders.  Surgical clips in the left upper quadrant.  Left ureteral stent.  IMPRESSION: Cardiac enlargement with developing pulmonary vascular congestion and perihilar edema.  Infiltration or atelectasis in the left lung base.  Possible left pleural effusion.   Original Report Authenticated By: Burman Nieves, M.D.  EKG: Independently reviewed.  Assessment/Plan Principal Problem:   Pulmonary edema Active Problems:    DIABETES MELLITUS, TYPE II, ON INSULIN   PLEURAL EFFUSION, LEFT   Atrial fibrillation, permanent   1. Pulmonary edema - fluid overload likely from resuscitation during recent visit for UTI with sepsis and keeping the patient off of his diuretics, clinically it sounds as though his sepsis is resolved, unfortunately although his CHF is well controlled at baseline it has decompensated as a result of the above.  Admitting to tele on CHF pathway, Lasix 40mg  IV q8h, strict intake and output, resume home ARB.  2D echo ordered.  Given the clinical picture, I do not believe that this patient has a new onset  HCAP at this time, specifically he has had no fevers nor chills (which is a major improvement since discharge), his WBC while elevated is actually down since discharge, no hypoxia, no other systemic symptoms new onset to suggest HCAP despite the unclear CXR findings.  On the other hand, given the aggressive fluid resuscitation and holding of his baseline diuretics at home post discharge, he has every reason to be fluid overloaded.  If he develops fever or other sign of infection will broaden ABx from their current CIPRO that is being used to treat his pseudomonas UTI. 2. IDDM - Q4H SSI for now 3. Concern for choking with swallowing food today - NPO and have ordered SLP bedside swallow eval. 4. A.Fib - tele monitor, continue coumadin    Code Status: Full Code (must indicate code status--if unknown or must be presumed, indicate so) Family Communication: Spoke with wife at bedside (indicate person spoken with, if applicable, with phone number if by telephone) Disposition Plan: Admit to inpatient (indicate anticipated LOS)  Time spent: 70 min  GARDNER, JARED M. [facility] Pager [phone]  If 7PM-7AM, please contact night-coverage [email or web address] Password [facility] 11/26/2012, 2:14 AM

## 2012-11-26 NOTE — Progress Notes (Signed)
SLP has not received call from MD with order for MBS.  Rec continue NPO except medications with applesauce - crushed if large and not contraindicated.  Rec aggressive oral care until MBS can be conducted with pt fully alert. SLP to follow up next date.   Donavan Burnet, MS [facility] SLP [phone]

## 2012-11-26 NOTE — Progress Notes (Signed)
INITIAL NUTRITION ASSESSMENT  DOCUMENTATION CODES Per approved criteria  -Obesity Unspecified   INTERVENTION: Diet advancement per MD discretion Provide Multivitamin with minerals daily Provide Juven BID  NUTRITION DIAGNOSIS: Inadequate oral intake related to swallowing difficulty as evidenced by pt's wife report of poor PO intake for 3 days and 3% wt loss in less than one month.   Goal: Pt to meet >/= 90% of their estimated nutrition needs   Monitor:  Diet advancement PO intake Weight Labs  Reason for Assessment: Low Braden  79 y.o. male  Admitting Dx: Pulmonary edema  ASSESSMENT: 79 y.o. male who was just discharged from our service 6 days ago after an admission for a UTI with pseudomonas. Since discharge he has getting progressively short of breath until today he developed cough, productive of a whitish sputum. In the ED CXR demonstrated pulmonary edema, and was suggestive of a L sided pleural effusion, there was some question of wether or not he had L sided atelectasis vs infiltrate. He was given Lasix IV and hospitalist asked to admit for decompensation of heart failure.  Pt asleep at time of visit. Discussed pt with pt's wife in room. Wife reports that pt usually has a great appetite and eats too much but, for the past 3 days he has had poor PO intake due to swallowing difficulty. Wife states that pt only had applesauce to eat yesterday. She reports pt usually weighs 223 lbs. Per nursing notes pt has a stage 2 sacral pressure ulcer. Per multidisciplinary rounds plan is for pt to have SLP evaluation today, then advance diet.  Height: Ht Readings from Last 1 Encounters:  11/26/12 5\' 8"  (1.727 m)    Weight: Wt Readings from Last 1 Encounters:  11/26/12 216 lb 12.8 oz (98.34 kg)    Ideal Body Weight: 154 lbs  % Ideal Body Weight: 140%  Wt Readings from Last 10 Encounters:  11/26/12 216 lb 12.8 oz (98.34 kg)  11/17/12 234 lb 5.6 oz (106.3 kg)  10/28/12 223 lb  (101.152 kg)  10/12/12 223 lb (101.152 kg)  03/03/11 222 lb (100.699 kg)  04/13/08 222 lb (100.699 kg)  03/13/08 212 lb (96.163 kg)    Usual Body Weight: 223 lbs  % Usual Body Weight: 97%  BMI:  Body mass index is 32.97 kg/(m^2).  Estimated Nutritional Needs: Kcal: 1950-2150 Protein: 140-150 grams Fluid: >/= 2.6 L/day  Skin: stage 2 pressure ulcer on sacrum, +2 RLE and LLE edema  Diet Order: NPO  EDUCATION NEEDS: -No education needs identified at this time   Intake/Output Summary (Last 24 hours) at 11/26/12 1110 Last data filed at 11/26/12 0804  Gross per 24 hour  Intake      0 ml  Output   3275 ml  Net  -3275 ml    Last BM: 8/22   Labs:   Recent Labs Lab 11/20/12 0532 11/26/12 0014 11/26/12 0218  NA 144 142  --   K 3.6 3.7  --   CL 98 95*  --   CO2 40* 42*  --   BUN 20 18  --   CREATININE 1.13 1.06  --   CALCIUM 9.6 9.7  --   MG  --   --  1.9  GLUCOSE 186* 71  --     CBG (last 3)   Recent Labs  11/26/12 0406 11/26/12 0504 11/26/12 0749  GLUCAP 57* 183* 82    Scheduled Meds: . atorvastatin  40 mg Oral QPM  . chlordiazePOXIDE  10  mg Oral TID  . ciprofloxacin  500 mg Oral BID  . DULoxetine  60 mg Oral QHS  . furosemide  80 mg Intravenous Q8H  . insulin aspart  0-20 Units Subcutaneous Q4H  . irbesartan  150 mg Oral Q breakfast  . isosorbide mononitrate  60 mg Oral Q supper  . pantoprazole  40 mg Oral Daily  . potassium chloride SA  20 mEq Oral Daily  . potassium chloride  40 mEq Oral Once  . senna-docusate  1 tablet Oral BID  . sodium chloride  3 mL Intravenous Q12H  . warfarin  5 mg Oral q1800  . Warfarin - Pharmacist Dosing Inpatient   Does not apply q1800    Continuous Infusions:   Past Medical History  Diagnosis Date  . Diabetes mellitus   . Hypertension   . Hypercholesterolemia   . Esophageal stricture   . Pulmonary embolism 2008    chronic coumadin   . Coronary artery disease     DR. BERRY IS PT'S CARDIOLOGIST  . Atrial  fibrillation     CHRONIC COUMADIN-pemanent atrial fib  . Anxiety     SINCE 1975   . Depression     SINCE 1975    . Shortness of breath     NOT SURE WHAT CAUSES SOB - BUT HE IS NOT ABLE TO BE ACTIVE - AND IS SOB WITH ANY ACTIVITY - USES OXYGEN 2 L NASAL CANNULA - ALL THE TIME -EXCEPT WHEN IN SHOWER OR CHANGING CLOTHES on home 02  . Peripheral vascular disease     TOLD SOME SMALL AMOUNT OF BLOCKAGE IN LEG WHEN HEART CATH DONE 2008  . Bladder tumor     HAS HAD HEMATURIA OFF AND ON - HAS FOLEY CATH THAT WAS PLACED JUNE 20TH, 2014  . GERD (gastroesophageal reflux disease)   . Edema     FEET AND LEGS MOST DAYS - SOMETIMES WEARS COMPRESSION HOSE  . Cancer     SKIN CANCERS  . Arthritis     S/P BILATERAL TOTAL KNEE REPLACEMENTS - BUT BOTH KNEES PAINFUL AND JOINTS WORN OUT; BAD RIGHT HIP - BUT NOT A CANDIDATE FOR HIP PREPLACMENT;  HAS SEVERE LOWER  BACK AND LEG PAIN - HAS SPINAL STENOSIS AND BULGING DISC; CURVATURE OF UPPER SPINE - UNABLE TO LAY HIS HEAD FLAT.  Marland Kitchen Pneumonia 2005  . Difficult intubation     DUE TO LIMITED NECK FLEXION AND SHORT NECK--ANESTHESIA RECORD FROM 2007 VATS SURGERY AT [facility] OBTAINED AND ON PT'S CHART.  Marland Kitchen Problems with hearing     WEARS BILATERAL HEARING AIDS    Past Surgical History  Procedure Laterality Date  . Shoulder surgery      bilateral  . Pancreas surgery  2001    TUMOR REMOVED FROM PANCREAS AND SPLEEN ALSO REMOVED  . Total knee arthroplasty      bilateral  . Lung surgery  2007    non-cancer  VIDEO ASSISTED THORCOTOMY TO REMOVE FLUID / DECORTICATION. DR. Edwyna Shell  . Basil cell    . Cardiac catheterization  2008    totally occluded nondominant LCX wth mod. ostial RCA disease and nl. EF  . Esophageal stretch    . Skin cancer removed from left side of head - required skin graft from left leg    . Cartilage repair 198- left knee    . Joint replacement    . Transurethral resection of bladder tumor with gyrus (turbt-gyrus) N/A 11/03/2012    Procedure:  TRANSURETHRAL RESECTION OF  BLADDER TUMOR WITH GYRUS (TURBT-GYRUS);  Surgeon: Milford Cage, MD;  Location: [facility];  Service: Urology;  Laterality: N/A;  . Cystoscopy/retrograde/ureteroscopy  11/03/2012    Procedure: CYSTOSCOPY/RETROGRADE/ LEFT URETEROSCOPY AND STENT PLACEMENT;  Surgeon: Milford Cage, MD;  Location: [facility];  Service: Urology;;  . Theador Hawthorne N/A 11/03/2012    Procedure: CYSTOGRAM;  Surgeon: Milford Cage, MD;  Location: [facility];  Service: Urology;  Laterality: N/A;    Ian Malkin RD, LDN Inpatient Clinical Dietitian Pager: [phone] After Hours Pager: [phone]

## 2012-11-27 ENCOUNTER — Inpatient Hospital Stay (HOSPITAL_COMMUNITY): Payer: Medicare Other

## 2012-11-27 DIAGNOSIS — I2510 Atherosclerotic heart disease of native coronary artery without angina pectoris: Secondary | ICD-10-CM

## 2012-11-27 DIAGNOSIS — Z7901 Long term (current) use of anticoagulants: Secondary | ICD-10-CM

## 2012-11-27 DIAGNOSIS — I10 Essential (primary) hypertension: Secondary | ICD-10-CM

## 2012-11-27 DIAGNOSIS — D126 Benign neoplasm of colon, unspecified: Secondary | ICD-10-CM

## 2012-11-27 LAB — BASIC METABOLIC PANEL
BUN: 23 mg/dL (ref 6–23)
CO2: 44 mEq/L (ref 19–32)
Calcium: 10 mg/dL (ref 8.4–10.5)
Creatinine, Ser: 1.25 mg/dL (ref 0.50–1.35)
Glucose, Bld: 92 mg/dL (ref 70–99)

## 2012-11-27 LAB — GLUCOSE, CAPILLARY: Glucose-Capillary: 124 mg/dL — ABNORMAL HIGH (ref 70–99)

## 2012-11-27 MED ORDER — LORAZEPAM 0.5 MG PO TABS: 0.5000 mg | ORAL_TABLET | Freq: Once | ORAL | Status: DC

## 2012-11-27 MED ORDER — KETOROLAC TROMETHAMINE 30 MG/ML IJ SOLN: 30.0000 mg | Freq: Once | INTRAMUSCULAR | Status: DC

## 2012-11-27 MED ORDER — WARFARIN SODIUM 4 MG PO TABS
4.0000 mg | ORAL_TABLET | Freq: Every day | ORAL | Status: DC
  Filled 2012-11-27: qty 1

## 2012-11-27 MED ORDER — FUROSEMIDE 10 MG/ML IJ SOLN
40.0000 mg | Freq: Two times a day (BID) | INTRAMUSCULAR | Status: DC
  Administered 2012-11-28: 40 mg via INTRAVENOUS
  Filled 2012-11-27 (×2): qty 4

## 2012-11-27 MED ORDER — POTASSIUM CHLORIDE 10 MEQ/100ML IV SOLN
10.0000 meq | INTRAVENOUS | Status: AC
  Administered 2012-11-27 (×3): 10 meq via INTRAVENOUS
  Filled 2012-11-27 (×3): qty 100

## 2012-11-27 MED ORDER — RESOURCE THICKENUP CLEAR PO POWD
ORAL | Status: DC | PRN
  Filled 2012-11-27: qty 125

## 2012-11-27 MED ORDER — STARCH (THICKENING) PO POWD: ORAL | Status: DC | PRN

## 2012-11-27 MED ORDER — WARFARIN SODIUM 5 MG IV SOLR
4.0000 mg | Freq: Once | INTRAVENOUS | Status: AC
  Administered 2012-11-27: 4 mg via INTRAVENOUS
  Filled 2012-11-27: qty 2

## 2012-11-27 NOTE — Progress Notes (Addendum)
Patient ID: Frederick [name], male   DOB: 1930-03-15, 79 y.o.   MRN: [id number] [facility] PROGRESS NOTE  Frederick [name] UUV:[id number] DOB: 1929-05-20 DOA: 11/25/2012 PCP: Georgianne Fick, MD  Brief narrative: [name] is 79 yo male with a history of diabetes mellitus on insulin, hypertension, coronary artery disease, Permanent A. fib and history of PE on chronic Coumadin, GERD, history of peripheral vascular disease, history of pancreatic tumor status post resection and recent transurethral bladder tumor resection and left ureteral stent placed by Dr Margarita Grizzle on 11/03/2012 and discharged home with a foley.earlier in August. He now presented to [facility] with main concern of progressively worsening shortness of breath initially noted with exertion and has progressed to dyspnea and rest, 2 pillow orthopnea and slightly worsening LE edema. In ED, CXR consistent with pulmonary vascular congestion. TR[facility] asked to admit for further evaluation and management of ? Diastolic CHF exacerbation.   Principal Problem:   Pulmonary edema - slight clinical improvement, weight down 216 lbs --> 213 lbs - continue Lasix IV but will decrease the frequency as Cr is elevated this AM - appreciate cardiology input - daily weights, I's and O's, BMP Active Problems:   Difficulty swallowing - failed bedside swallow test, MBS pending for today   Hypokalemia - supplemented, will repeat BMP in AM - check Mg level    DIABETES MELLITUS, TYPE II, ON INSULIN - reasonable inpatient control - monitor CBG per floor protocol   Atrial fibrillation, permanent - rate control, continue Coumadin   Leukocytosis - WBC trending down - pt remains afebrile over 24 hours - no need for ABX at this time - CBC in AM  UTI, Pseudomonas - on last admission - cultures showed pan sensitivity, will d/c Cipro today as pt completed the therapy   Consultants:  Cardiology   Procedures/Studies:  Dg Chest 2 View  11/25/2012  Cardiac enlargement  with developing pulmonary vascular congestion and perihilar edema.  Infiltration or atelectasis in the left lung base.    Antibiotics:  None  Code Status: Full Family Communication: Wife at bedside Disposition Plan: Home when medically stable  HPI/Subjective: No events overnight.   Objective: Filed Vitals:   11/26/12 0318 11/26/12 1623 11/26/12 2105 11/27/12 0430  BP: 131/71 129/78 123/68 147/76  Pulse: 91 105 103 105  Temp: 98 F (36.7 C) 98.6 F (37 C) 97.6 F (36.4 C) 97.8 F (36.6 C)  TempSrc: Oral Oral Oral Oral  Resp: 20 14 16 18   Height: 5\' 8"  (1.727 m)     Weight: 98.34 kg (216 lb 12.8 oz)   96.8 kg (213 lb 6.5 oz)  SpO2: 99% 94% 96% 95%    Intake/Output Summary (Last 24 hours) at 11/27/12 0557 Last data filed at 11/27/12 0500  Gross per 24 hour  Intake     40 ml  Output   3850 ml  Net  -3810 ml    Exam:   General:  Pt is somnolent but follows commands appropriately, very weak and HOH  Cardiovascular: Irregular rhythm, tachycardic, SEM 2/6,  no rubs, no gallops  Respiratory: Clear to auscultation bilaterally, bibasilar crackles, diminished air movement   Abdomen: Soft, non tender, non distended, bowel sounds present, no guarding  Extremities: No edema, pulses DP and PT palpable bilaterally  Data Reviewed: Basic Metabolic Panel:  Recent Labs Lab 11/26/12 0014 11/26/12 0218  NA 142  --   K 3.7  --   CL 95*  --   CO2 42*  --   GLUCOSE 71  --  BUN 18  --   CREATININE 1.06  --   CALCIUM 9.7  --   MG  --  1.9   CBC:  Recent Labs Lab 11/26/12 0014  WBC 17.0*  NEUTROABS 13.7*  HGB 11.4*  HCT 38.7*  MCV 100.8*  PLT 474*   Cardiac Enzymes:  Recent Labs Lab 11/26/12 0218 11/26/12 0810 11/26/12 1349  TROPONINI <0.30 <0.30 <0.30   CBG:  Recent Labs Lab 11/26/12 1223 11/26/12 1803 11/26/12 2001 11/26/12 2345 11/27/12 0427  GLUCAP 122* 92 107* 90 88    Recent Results (from the past 240 hour(s))  MRSA PCR SCREENING      Status: Abnormal   Collection Time    11/26/12  4:51 AM      Result Value Range Status   MRSA by PCR POSITIVE (*) NEGATIVE Final   Comment:            The GeneXpert MRSA Assay (FDA     approved for NASAL specimens     only), is one component of a     comprehensive MRSA colonization     surveillance program. It is not     intended to diagnose MRSA     infection nor to guide or     monitor treatment for     MRSA infections.     RESULT CALLED TO, READ BACK BY AND VERIFIED WITH:     A. MOORE RN AT 1191 ON 08.22.14 BY SHUEA     Scheduled Meds: . atorvastatin  40 mg Oral QPM  . chlordiazePOXIDE  10 mg Oral TID  . ciprofloxacin  400 mg Intravenous Q12H  . ciprofloxacin  500 mg Oral BID  . DULoxetine  60 mg Oral QHS  . furosemide  40 mg Intravenous Q8H  . insulin aspart  0-20 Units Subcutaneous Q4H  . irbesartan  150 mg Oral Q breakfast  . isosorbide mononitrate  60 mg Oral Q supper  . multivitamin with minerals  1 tablet Oral Daily  . nutrition supplement  1 packet Oral BID BM  . pantoprazole  40 mg Oral Daily  . potassium chloride SA  20 mEq Oral Daily  . potassium chloride  40 mEq Oral Once  . senna-docusate  1 tablet Oral BID  . sodium chloride  3 mL Intravenous Q12H  . Warfarin - Pharmacist Dosing Inpatient   Does not apply q1800   Continuous Infusions:    Debbora Presto, MD  [facility] Pager [phone]  If 7PM-7AM, please contact night-coverage [email or web address] Password [facility] 11/27/2012, 5:57 AM   LOS: 2 days

## 2012-11-27 NOTE — Progress Notes (Signed)
  Subjective:  Breathing much improved.  Very weak.  Objective:  Vital Signs in the last 24 hours: Temp:  [97.6 F (36.4 C)-98.6 F (37 C)] 97.8 F (36.6 C) (08/23 0430) Pulse Rate:  [103-105] 105 (08/23 0430) Resp:  [14-18] 18 (08/23 0430) BP: (123-147)/(68-78) 147/76 mmHg (08/23 0430) SpO2:  [94 %-96 %] 95 % (08/23 0430) Weight:  [213 lb 6.5 oz (96.8 kg)] 213 lb 6.5 oz (96.8 kg) (08/23 0430)  Intake/Output from previous day: 08/22 0701 - 08/23 0700 In: 75.2 [I.V.:75.2] Out: 3850 [Urine:3850] Intake/Output from this shift:   Physical Exam: General appearance: alert, cooperative, no distress and moderately obese - hard of hearing.  Very weak -- cannot even pull himself up in bed. Neck: no adenopathy, no carotid bruit, no JVD and supple, symmetrical, trachea midline Lungs: mild bibasal rales.  Non-labored. Heart: regular rate and rhythm, 2/6 SEM c-d @ RUSB. No R/G Abdomen: soft, non-tender; bowel sounds normal; no masses,  no organomegaly and obese Extremities: extremities normal, atraumatic, no cyanosis or edema Pulses: 2+ and symmetric Neurologic: Does not talk much - hard to determine if he does not hear or does not understand.  + follows commands.  CN 2-12 grossly intact.  Lab Results:  Recent Labs  11/26/12 0014  WBC 17.0*  HGB 11.4*  PLT 474*    Recent Labs  11/26/12 0014 11/27/12 0538  NA 142 142  K 3.7 3.1*  CL 95* 90*  CO2 42* 44*  GLUCOSE 71 92  BUN 18 23  CREATININE 1.06 1.25    Recent Labs  11/26/12 0810 11/26/12 1349  TROPONINI <0.30 <0.30   Imaging: None today  Cardiac Studies: Echo Pending  Assessment/Plan:  Principal Problem:   Pulmonary edema - with normal LV systolic function. Active Problems:   DIABETES MELLITUS, TYPE II, ON INSULIN   HYPERTENSION   Atrial fibrillation, permanent   PLEURAL EFFUSION, LEFT  Brisk diuresis overninght with Cr increasing a bit -- will back of to 40 mg BID IV for now.  -- Wgt down 3Lb despite ~6L  negative (not accurate).  Would continue IV today & likely convert to PO tomorrow if UOP continues to be as brisk.  Lungs still sound a bit wet.    I agree with Dr. Allyson Sabal - the pt is extremely debilitated & would definitely benefit from short term placement.  (they are on the waiting list @ [facility]).  This would be a great opportunity for him to get into a potential assisted living situation.  BP stable on ARB & Imdur. Relacing K+  Swallo study pending -- this needs to be resolved prior to d/c.   Afib rate controlled, INR therapeutic -- monitor closely while on warfarin.   LOS: 2 days    HARDING,DAVID W 11/27/2012, 10:40 AM

## 2012-11-27 NOTE — Progress Notes (Signed)
ANTICOAGULATION CONSULT NOTE - Follow Up Consult  Pharmacy Consult for warfarin Indication: h/o pulmonary embolism, afib  Allergies  Allergen Reactions  . Hydromorphone Hcl Other (See Comments)    Dilaudid caused Confusion   . Morphine Other (See Comments)    confusion  . Pregabalin Other (See Comments)    lyrica - caused hallucinations    Labs:  Recent Labs  11/26/12 0014 11/26/12 0218 11/26/12 0353 11/26/12 0810 11/26/12 1349 11/27/12 0538  HGB 11.4*  --   --   --   --   --   HCT 38.7*  --   --   --   --   --   PLT 474*  --   --   --   --   --   LABPROT  --   --  25.2*  --   --  27.3*  INR  --   --  2.38*  --   --  2.64*  CREATININE 1.06  --   --   --   --  1.25  TROPONINI  --  <0.30  --  <0.30 <0.30  --     Assessment: 83 yom recently discharged from [facility] 8/16 for sepsis 2/2 pseudomonas UTI presented 8/21 with progressive SOB, productive cough. Patient with h/o afib and PE on chronic warfarin. Patient was reportedly on warfarin 5mg  po daily with LD 8/21  INR (2.64) remains therapeutic today.    CBC okay (8/22), no bleeding reported.  DDI with Ciprofloxacin noted - patient was started on Ciprofloxacin 8/16, anticipated to continue until 8/26.   Diet changed to NPO starting 8/22 until cleared by swallow exam.  Goal of Therapy:  INR 2-3 Monitor platelets by anticoagulation protocol: Yes   Plan:   Warfarin 4mg  PO today.  Daily PT/INR  Pharmacy will f/u daily   Lynann Beaver PharmD, BCPS Pager [phone] 11/27/2012 8:52 AM

## 2012-11-27 NOTE — Progress Notes (Signed)
CRITICAL VALUE ALERT  Critical value received: CO2-44  Date of notification:11/27/12  Time of notification: 6:50am  Critical value read back:yes   Nurse who received alert:Tina Hyacinth Meeker and Ernesta Amble  MD notified (1st page): Artist Beach, NP  Time of first page:6:55  MD notified (2nd page):  Time of second page:  Responding MD: Jerrye Beavers, NP  Time MD responded: 7:00am

## 2012-11-27 NOTE — Procedures (Signed)
Objective Swallowing Evaluation: Modified Barium Swallowing Study  Patient Details  Name: Frederick [name] MRN: [id number] Date of Birth: 11-Jun-1929  Today's Date: 11/27/2012 Time: 1700-1730 SLP Time Calculation (min): 30 min  Past Medical History:  Past Medical History  Diagnosis Date  . Diabetes mellitus   . Hypertension   . Hypercholesterolemia   . Esophageal stricture   . Pulmonary embolism 2008    chronic coumadin   . Coronary artery disease     DR. BERRY IS PT'S CARDIOLOGIST  . Atrial fibrillation     CHRONIC COUMADIN-pemanent atrial fib  . Anxiety     SINCE 1975   . Depression     SINCE 1975    . Shortness of breath     NOT SURE WHAT CAUSES SOB - BUT HE IS NOT ABLE TO BE ACTIVE - AND IS SOB WITH ANY ACTIVITY - USES OXYGEN 2 L NASAL CANNULA - ALL THE TIME -EXCEPT WHEN IN SHOWER OR CHANGING CLOTHES on home 02  . Peripheral vascular disease     TOLD SOME SMALL AMOUNT OF BLOCKAGE IN LEG WHEN HEART CATH DONE 2008  . Bladder tumor     HAS HAD HEMATURIA OFF AND ON - HAS FOLEY CATH THAT WAS PLACED JUNE 20TH, 2014  . GERD (gastroesophageal reflux disease)   . Edema     FEET AND LEGS MOST DAYS - SOMETIMES WEARS COMPRESSION HOSE  . Cancer     SKIN CANCERS  . Arthritis     S/P BILATERAL TOTAL KNEE REPLACEMENTS - BUT BOTH KNEES PAINFUL AND JOINTS WORN OUT; BAD RIGHT HIP - BUT NOT A CANDIDATE FOR HIP PREPLACMENT;  HAS SEVERE LOWER  BACK AND LEG PAIN - HAS SPINAL STENOSIS AND BULGING DISC; CURVATURE OF UPPER SPINE - UNABLE TO LAY HIS HEAD FLAT.  Marland Kitchen Pneumonia 2005  . Difficult intubation     DUE TO LIMITED NECK FLEXION AND SHORT NECK--ANESTHESIA RECORD FROM 2007 VATS SURGERY AT [facility] OBTAINED AND ON PT'S CHART.  Marland Kitchen Problems with hearing     WEARS BILATERAL HEARING AIDS   Past Surgical History:  Past Surgical History  Procedure Laterality Date  . Shoulder surgery      bilateral  . Pancreas surgery  2001    TUMOR REMOVED FROM PANCREAS AND SPLEEN ALSO REMOVED  . Total knee  arthroplasty      bilateral  . Lung surgery  2007    non-cancer  VIDEO ASSISTED THORCOTOMY TO REMOVE FLUID / DECORTICATION. DR. Edwyna Shell  . Basil cell    . Cardiac catheterization  2008    totally occluded nondominant LCX wth mod. ostial RCA disease and nl. EF  . Esophageal stretch    . Skin cancer removed from left side of head - required skin graft from left leg    . Cartilage repair 198- left knee    . Joint replacement    . Transurethral resection of bladder tumor with gyrus (turbt-gyrus) N/A 11/03/2012    Procedure: TRANSURETHRAL RESECTION OF BLADDER TUMOR WITH GYRUS (TURBT-GYRUS);  Surgeon: Milford Cage, MD;  Location: [facility];  Service: Urology;  Laterality: N/A;  . Cystoscopy/retrograde/ureteroscopy  11/03/2012    Procedure: CYSTOSCOPY/RETROGRADE/ LEFT URETEROSCOPY AND STENT PLACEMENT;  Surgeon: Milford Cage, MD;  Location: [facility];  Service: Urology;;  . Theador Hawthorne N/A 11/03/2012    Procedure: CYSTOGRAM;  Surgeon: Milford Cage, MD;  Location: [facility];  Service: Urology;  Laterality: N/A;   HPI:  Frederick [name] is a 79 y.o. male who was  just discharged from our service 6 days ago after an admission for a UTI with pseudomonas.  Since discharge he has getting progressively short of breath until today he developed cough, productive of a whitish sputum.  He has had no fever, no chills, no sweating since Monday of this week.  In the ED while his WBC is elevated at 17k this is significantly down from the >30k it was at discharge 6 days ago.  At the time of discharge he had been taken off of his Zaroxylyn and had not yet restarted this, he did restart his lasix on Tuesday.  MBS indicated following results of BSE completed on 11/25/12.       Assessment / Plan / Recommendation Clinical Impression  Dysphagia Diagnosis: Moderate oral phase dysphagia;Severe pharyngeal phase dysphagia;Severe cervical esophageal phase dysphagia;Suspected primary esophageal dysphagia  MBS  completed.  Patient with head down posture with difficulty  maintaining  upright positon.  Assist required to reposition patient throughout study as view blocked by shoulders.  Evaluation indicates moderate oral dysphagia marked by weak lingual manipulation and reduced posterior propulsion.  Piecemeal swallows with all consistencies.  Severe pharyngeal phase dysphagia with suspected primary esophageal dysphagia.  Decreased TBR resulting in residuals in vallecular space with all consistencies.  Reduced pharyngeal peristalsis.  Diffuse residue from vallecular space to pyriforms s/p swallow of nectar barium by cup and spoon, puree and mechanical soft consistency.  Prominent CP segment with pooling in pyriforms with all consistencies prior to swallow with moderate amount of containment s/p swallow.  Backflow into pharynx during swallow of thin liquid barium by cup, straw and spoon with eventual penetration s/p swallow from residuals.  Unable to confirm aspiration with thins due to shoulders blocking view. Strategies of multiple effortful swallows with puree consistency and nectar thick liquids by straw  effective in clearing majority of residuals but patient quickly fatigued. No backflow to pharynx noted with puree and nectar thick barium.    Brief esophageal sweep revealed significant amount of stasis with backflow to cervical esophagus.  No radiologist present to confirm.  Patient at high risk for aspiration with any consistency due to residuals and risk of backflow of bolus into pharynx from cervical esophagus.   MD given results of evaluation by treating SLP.  MD confirmed to proceed with dysphagia 1 consistency and nectar thick liquids in limited amounts with full supervision with strict aspiration and reflux precautions.  Aspiration risk remains even with modified diet.   Diagnostic treatment completed following evaluation focusing on providing education to caregivers on swallow strategies to maximize safety.    Patient may benefit from GI consult to assess current esophageal functioning.  ST to follow in acute care setting for diet tolerance and POC.   Recommend repeat MBS following results of GI consult.         Diet Recommendation Dysphagia 1 (Puree);Nectar-thick liquid   Liquid Administration via: Straw;Cup Medication Administration: Via alternative means Supervision: Patient able to self feed;Full supervision/cueing for compensatory strategies Compensations: Slow rate;Small sips/bites;Multiple dry swallows after each bite/sip;Clear throat intermittently;Hard cough after swallow;Effortful swallow Postural Changes and/or Swallow Maneuvers: Seated upright 90 degrees;Upright 30-60 min after meal    Other  Recommendations Recommended Consults: Consider GI evaluation Oral Care Recommendations: Oral care before and after PO Other Recommendations: Order thickener from pharmacy;Prohibited food (jello, ice cream, thin soups);Have oral suction available;Clarify dietary restrictions   Follow Up Recommendations  Outpatient SLP    Frequency and Duration min 2x/week  2 weeks  SLP Swallow Goals Patient will utilize recommended strategies during swallow to increase swallowing safety with: Total assistance   General Date of Onset: 11/26/12 HPI: Frederick [name] is a 79 y.o. male who was just discharged from our service 6 days ago after an admission for a UTI with pseudomonas.  Since discharge he has getting progressively short of breath until today he developed cough, productive of a whitish sputum.  He has had no fever, no chills, no sweating since Monday of this week.  In the ED while his WBC is elevated at 17k this is significantly down from the >30k it was at discharge 6 days ago.  At the time of discharge he had been taken off of his Zaroxylyn and had not yet restarted this, he did restart his lasix on Tuesday. Type of Study: Modified Barium Swallowing Study Reason for Referral: Objectively  evaluate swallowing function Previous Swallow Assessment: BSE 11/26/12 Diet Prior to this Study: NPO Temperature Spikes Noted: No Respiratory Status: Supplemental O2 delivered via (comment) History of Recent Intubation: No Behavior/Cognition: Alert;Cooperative;Hard of hearing;Requires cueing Oral Cavity - Dentition: Missing dentition Oral Motor / Sensory Function: Impaired - see Bedside swallow eval Self-Feeding Abilities: Able to feed self;Needs assist;Needs set up Patient Positioning: Upright in chair Baseline Vocal Quality: Breathy;Hoarse;Low vocal intensity Volitional Cough: Strong Volitional Swallow: Able to elicit Pharyngeal Secretions: Not observed secondary MBS    Reason for Referral Objectively evaluate swallowing function   Oral Phase Oral Preparation/Oral Phase Oral Phase: Impaired Oral - Nectar Oral - Nectar Teaspoon: Weak lingual manipulation;Lingual pumping;Incomplete tongue to palate contact;Holding of bolus;Reduced posterior propulsion;Lingual/palatal residue;Piecemeal swallowing;Delayed oral transit Oral - Nectar Cup: Weak lingual manipulation;Lingual pumping;Incomplete tongue to palate contact;Reduced posterior propulsion;Holding of bolus;Lingual/palatal residue;Piecemeal swallowing Oral - Nectar Straw: Weak lingual manipulation;Lingual pumping;Incomplete tongue to palate contact;Reduced posterior propulsion;Holding of bolus;Piecemeal swallowing;Lingual/palatal residue;Delayed oral transit Oral - Thin Oral - Thin Teaspoon: Weak lingual manipulation;Lingual pumping;Incomplete tongue to palate contact;Impaired mastication;Lingual/palatal residue;Piecemeal swallowing;Holding of bolus;Reduced posterior propulsion;Delayed oral transit Oral - Thin Cup: Weak lingual manipulation;Lingual pumping;Incomplete tongue to palate contact;Reduced posterior propulsion;Holding of bolus;Lingual/palatal residue;Piecemeal swallowing;Delayed oral transit Oral - Thin Straw: Weak lingual  manipulation;Lingual pumping;Incomplete tongue to palate contact;Reduced posterior propulsion;Holding of bolus;Piecemeal swallowing;Lingual/palatal residue Oral - Solids Oral - Puree: Weak lingual manipulation;Lingual pumping;Holding of bolus;Reduced posterior propulsion;Lingual/palatal residue;Piecemeal swallowing;Delayed oral transit Oral - Mechanical Soft: Impaired mastication;Weak lingual manipulation;Lingual pumping;Incomplete tongue to palate contact;Piecemeal swallowing;Lingual/palatal residue;Delayed oral transit   Pharyngeal Phase Pharyngeal Phase Pharyngeal Phase: Impaired Pharyngeal - Nectar Pharyngeal - Nectar Teaspoon: Premature spillage to valleculae;Delayed swallow initiation;Premature spillage to pyriform sinuses;Reduced pharyngeal peristalsis;Reduced anterior laryngeal mobility;Reduced laryngeal elevation;Reduced airway/laryngeal closure;Reduced tongue base retraction;Penetration/Aspiration during swallow;Penetration/Aspiration after swallow;Pharyngeal residue - pyriform sinuses;Pharyngeal residue - valleculae;Pharyngeal residue - cp segment Penetration/Aspiration details (nectar teaspoon): Material enters airway, CONTACTS cords then ejected out Pharyngeal - Nectar Cup: Premature spillage to valleculae;Premature spillage to pyriform sinuses;Delayed swallow initiation;Reduced pharyngeal peristalsis;Reduced anterior laryngeal mobility;Reduced laryngeal elevation;Reduced airway/laryngeal closure;Reduced tongue base retraction;Pharyngeal residue - pyriform sinuses;Pharyngeal residue - valleculae;Pharyngeal residue - cp segment Pharyngeal - Thin Pharyngeal - Thin Teaspoon: Delayed swallow initiation;Premature spillage to valleculae;Premature spillage to pyriform sinuses;Reduced pharyngeal peristalsis;Reduced anterior laryngeal mobility;Reduced tongue base retraction;Reduced airway/laryngeal closure;Penetration/Aspiration after swallow;Pharyngeal residue - pyriform sinuses;Pharyngeal residue -  valleculae;Pharyngeal residue - cp segment Penetration/Aspiration details (thin teaspoon): Material enters airway, passes BELOW cords and not ejected out despite cough attempt by patient Pharyngeal - Thin Cup: Premature spillage to pyriform sinuses;Delayed swallow initiation;Reduced pharyngeal peristalsis;Reduced anterior laryngeal mobility;Reduced laryngeal elevation;Reduced airway/laryngeal closure;Reduced tongue base retraction;Penetration/Aspiration after  swallow;Trace aspiration;Pharyngeal residue - valleculae;Pharyngeal residue - pyriform sinuses;Pharyngeal residue - posterior pharnyx;Pharyngeal residue - cp segment Penetration/Aspiration details (thin cup): Material enters airway, passes BELOW cords and not ejected out despite cough attempt by patient Pharyngeal - Thin Straw: Premature spillage to pyriform sinuses;Reduced pharyngeal peristalsis;Reduced anterior laryngeal mobility;Reduced laryngeal elevation;Reduced airway/laryngeal closure;Reduced tongue base retraction;Penetration/Aspiration after swallow;Pharyngeal residue - pyriform sinuses;Pharyngeal residue - posterior pharnyx;Pharyngeal residue - cp segment;Pharyngeal residue - valleculae Penetration/Aspiration details (thin straw): Material enters airway, passes BELOW cords and not ejected out despite cough attempt by patient Pharyngeal - Solids Pharyngeal - Puree: Premature spillage to valleculae;Reduced pharyngeal peristalsis;Reduced anterior laryngeal mobility;Reduced tongue base retraction;Reduced airway/laryngeal closure;Reduced laryngeal elevation;Pharyngeal residue - pyriform sinuses;Pharyngeal residue - posterior pharnyx;Pharyngeal residue - valleculae;Pharyngeal residue - cp segment Pharyngeal - Mechanical Soft: Premature spillage to valleculae;Reduced pharyngeal peristalsis;Reduced anterior laryngeal mobility;Reduced laryngeal elevation;Reduced airway/laryngeal closure;Reduced tongue base retraction;Penetration/Aspiration after  swallow;Pharyngeal residue - posterior pharnyx;Pharyngeal residue - cp segment;Pharyngeal residue - pyriform sinuses;Pharyngeal residue - valleculae Penetration/Aspiration details (mechanical soft): Material enters airway, passes BELOW cords then ejected out  Cervical Esophageal Phase    GO    Cervical Esophageal Phase Cervical Esophageal Phase: Impaired Cervical Esophageal Phase - Nectar Nectar Cup: Reduced cricopharyngeal relaxation;Prominent cricopharyngeal segment;Esophageal backflow into cervical esophagus Nectar Straw: Prominent cricopharyngeal segment;Esophageal backflow into cervical esophagus Cervical Esophageal Phase - Thin Thin Cup: Reduced cricopharyngeal relaxation;Prominent cricopharyngeal segment;Esophageal backflow into cervical esophagus;Esophageal backflow into the pharynx Cervical Esophageal Phase - Solids Puree: Prominent cricopharyngeal segment;Esophageal backflow into cervical esophagus;Reduced cricopharyngeal relaxation Mechanical Soft: Reduced cricopharyngeal relaxation;Prominent cricopharyngeal segment        Moreen Fowler MS, CCC-SLP [phone] [facility] 11/27/2012, 5:25 PM

## 2012-11-27 NOTE — Evaluation (Signed)
Physical Therapy Evaluation Patient Details Name: Frederick [name] MRN: [id number] DOB: [birth year] Today's Date: 11/27/2012 Time: 7829-5621 PT Time Calculation (min): 28 min  PT Assessment / Plan / Recommendation History of Present Illness  Frederick [name] is a 79 y.o. male who was just discharged from our service 6 days ago after an admission for a UTI with pseudomonas.  Since discharge he has getting progressively short of breath until today he developed cough, productive of a whitish sputum.    Clinical Impression  Pt appears to be very deconditioned.  He needs significant assist for mobility, but I anticipate he would benefit from continued PT to decrease burden of care    PT Assessment  Patient needs continued PT services    Follow Up Recommendations  SNF    Does the patient have the potential to tolerate intense rehabilitation      Barriers to Discharge        Equipment Recommendations  None recommended by PT    Recommendations for Other Services OT consult   Frequency Min 3X/week    Precautions / Restrictions Precautions Precautions: Fall Restrictions Weight Bearing Restrictions: No Other Position/Activity Restrictions: limited mobility at baseline-stand pivot transfers only (due to chronic R hip issues)   Pertinent Vitals/Pain Pt reports some pain in right leg with attempts to weight bear and transfer      Mobility  Bed Mobility Bed Mobility: Supine to Sit Supine to Sit: HOB elevated;1: +2 Total assist Supine to Sit: Patient Percentage: 20% Details for Bed Mobility Assistance: pt awake  bed pad used to slide pt .  He did try to initiate by moving legs toward edge of bed Transfers Sit to Stand: 1: +2 Total assist;From bed;From elevated surface Sit to Stand: Patient Percentage: 30% (signifcant assist needed for lift off) Stand to Sit: 1: +2 Total assist;To chair/3-in-1 Stand to Sit: Patient Percentage: 30% Stand Pivot Transfers: 1: +2 Total assist Stand Pivot  Transfers: Patient Percentage: 20% Details for Transfer Assistance: Pt needed signficant assist to lift off, once on his feet he was able to stand for about 10 sec.  Pt was incontinent of stool and pivoted to [facility].  Stood again for hygiene and to have chair pulled up Ambulation/Gait Ambulation/Gait Assistance: Not tested (comment)    Exercises     PT Diagnosis: Difficulty walking;Generalized weakness  PT Problem List: Decreased activity tolerance;Decreased balance;Decreased mobility;Decreased safety awareness;Obesity PT Treatment Interventions: DME instruction;Functional mobility training;Therapeutic activities;Therapeutic exercise;Patient/family education;Balance training     PT Goals(Current goals can be found in the care plan section) Acute Rehab PT Goals Patient Stated Goal: for pt to be able to stand and pivot to motorized w/c or BSC PT Goal Formulation: With patient/family Time For Goal Achievement: 12/11/12 Potential to Achieve Goals: Fair  Visit Information  Last PT Received On: 11/27/12 Assistance Needed: +2 History of Present Illness: Frederick [name] is a 79 y.o. male who was just discharged from our service 6 days ago after an admission for a UTI with pseudomonas.  Since discharge he has getting progressively short of breath until today he developed cough, productive of a whitish sputum.         Prior Functioning  Home Living Family/patient expects to be discharged to:: Private residence Living Arrangements: Spouse/significant other Available Help at Discharge: Family Type of Home: House Home Layout: One level Home Equipment: Environmental consultant - 2 wheels;Wheelchair - manual;Wheelchair - power Additional Comments: pt states Genevieve Norlander was working on getting them a hospital bed and and hoyer lift Prior  Function Level of Independence: Needs assistance Gait / Transfers Assistance Needed: +1 assist with RW-stand pivot transfers Comments: pt slept in a recliner and used it to assist to  stand Communication Communication: HOH;Expressive difficulties (speech difficult to understand)    Cognition  Cognition Arousal/Alertness: Awake/alert Behavior During Therapy: Flat affect Overall Cognitive Status: History of cognitive impairments - at baseline    Extremity/Trunk Assessment Lower Extremity Assessment Lower Extremity Assessment: RLE deficits/detail;LLE deficits/detail RLE Deficits / Details: unable to test R hip due to discomfort with movement. R knee ext 3-/5, muscle atophy LLE Deficits / Details: Strength at least 3+/5 throughout, muscle atrophy Cervical / Trunk Assessment Cervical / Trunk Assessment: Kyphotic;Other exceptions Cervical / Trunk Exceptions: obese, generalized atrophy   Balance Static Sitting Balance Static Sitting - Balance Support: No upper extremity supported;Feet supported Static Sitting - Level of Assistance: 5: Stand by assistance Static Sitting - Comment/# of Minutes: Kyphosis maintained  End of Session PT - End of Session Equipment Utilized During Treatment: Gait belt Activity Tolerance: Patient limited by fatigue Patient left: in chair;with family/visitor present;with call bell/phone within reach Nurse Communication: Mobility status;Need for lift equipment  GP     Donnetta Hail 11/27/2012, 2:50 PM

## 2012-11-28 ENCOUNTER — Inpatient Hospital Stay (HOSPITAL_COMMUNITY): Payer: Medicare Other

## 2012-11-28 DIAGNOSIS — I5030 Unspecified diastolic (congestive) heart failure: Secondary | ICD-10-CM | POA: Diagnosis present

## 2012-11-28 DIAGNOSIS — J9600 Acute respiratory failure, unspecified whether with hypoxia or hypercapnia: Secondary | ICD-10-CM

## 2012-11-28 DIAGNOSIS — J690 Pneumonitis due to inhalation of food and vomit: Secondary | ICD-10-CM

## 2012-11-28 DIAGNOSIS — N179 Acute kidney failure, unspecified: Secondary | ICD-10-CM | POA: Diagnosis present

## 2012-11-28 DIAGNOSIS — R4182 Altered mental status, unspecified: Secondary | ICD-10-CM | POA: Diagnosis present

## 2012-11-28 DIAGNOSIS — J811 Chronic pulmonary edema: Secondary | ICD-10-CM

## 2012-11-28 DIAGNOSIS — A419 Sepsis, unspecified organism: Secondary | ICD-10-CM | POA: Diagnosis present

## 2012-11-28 DIAGNOSIS — E876 Hypokalemia: Secondary | ICD-10-CM | POA: Diagnosis present

## 2012-11-28 DIAGNOSIS — J9601 Acute respiratory failure with hypoxia: Secondary | ICD-10-CM | POA: Diagnosis present

## 2012-11-28 LAB — URINALYSIS, ROUTINE W REFLEX MICROSCOPIC
Glucose, UA: NEGATIVE mg/dL
Ketones, ur: NEGATIVE mg/dL
Nitrite: NEGATIVE
pH: 5.5 (ref 5.0–8.0)

## 2012-11-28 LAB — BASIC METABOLIC PANEL
BUN: 41 mg/dL — ABNORMAL HIGH (ref 6–23)
BUN: 43 mg/dL — ABNORMAL HIGH (ref 6–23)
CO2: 43 mEq/L (ref 19–32)
Calcium: 9.8 mg/dL (ref 8.4–10.5)
Chloride: 90 mEq/L — ABNORMAL LOW (ref 96–112)
Chloride: 91 mEq/L — ABNORMAL LOW (ref 96–112)
Creatinine, Ser: 1.45 mg/dL — ABNORMAL HIGH (ref 0.50–1.35)
GFR calc Af Amer: 51 mL/min — ABNORMAL LOW (ref >90)
GFR calc non Af Amer: 44 mL/min — ABNORMAL LOW (ref >90)
GFR calc non Af Amer: 43 mL/min — ABNORMAL LOW (ref >90)
Glucose, Bld: 205 mg/dL — ABNORMAL HIGH (ref 70–99)
Potassium: 4.1 mEq/L (ref 3.5–5.1)

## 2012-11-28 LAB — BLOOD GAS, ARTERIAL
Acid-Base Excess: 14.8 mmol/L — ABNORMAL HIGH (ref 0.0–2.0)
Bicarbonate: 44.3 mEq/L — ABNORMAL HIGH (ref 20.0–24.0)
FIO2: 1 %
MECHVT: 530 mL
O2 Saturation: 99.2 %
Patient temperature: 37
TCO2: 39.8 mmol/L (ref 0–100)
TCO2: 33.2 mmol/L (ref 0–100)
pCO2 arterial: 80.3 mmHg (ref 35.0–45.0)
pH, Arterial: 7.36 (ref 7.350–7.450)
pO2, Arterial: 63.6 mmHg — ABNORMAL LOW (ref 80.0–100.0)

## 2012-11-28 LAB — PROTIME-INR: Prothrombin Time: 27.1 seconds — ABNORMAL HIGH (ref 11.6–15.2)

## 2012-11-28 LAB — GLUCOSE, CAPILLARY
Glucose-Capillary: 103 mg/dL — ABNORMAL HIGH (ref 70–99)
Glucose-Capillary: 185 mg/dL — ABNORMAL HIGH (ref 70–99)
Glucose-Capillary: 170 mg/dL — ABNORMAL HIGH (ref 70–99)

## 2012-11-28 LAB — CBC
MCH: 30.2 pg (ref 26.0–34.0)
MCHC: 31.3 g/dL (ref 30.0–36.0)
MCV: 96.5 fL (ref 78.0–100.0)
Platelets: 453 10*3/uL — ABNORMAL HIGH (ref 150–400)
RDW: 14 % (ref 11.5–15.5)

## 2012-11-28 LAB — CORTISOL: Cortisol, Plasma: 23 ug/dL

## 2012-11-28 LAB — MAGNESIUM: Magnesium: 1.9 mg/dL (ref 1.5–2.5)

## 2012-11-28 LAB — URINE MICROSCOPIC-ADD ON

## 2012-11-28 MED ORDER — PROPOFOL 10 MG/ML IV EMUL
5.0000 ug/kg/min | INTRAVENOUS | Status: DC
  Administered 2012-11-28 – 2012-11-29 (×3): 20 ug/kg/min via INTRAVENOUS
  Administered 2012-11-29: 10 ug/kg/min via INTRAVENOUS
  Administered 2012-11-30: 5 ug/kg/min via INTRAVENOUS
  Administered 2012-11-30: 12 ug/kg/min via INTRAVENOUS
  Filled 2012-11-28 (×5): qty 100

## 2012-11-28 MED ORDER — POTASSIUM CHLORIDE 20 MEQ/15ML (10%) PO LIQD
40.0000 meq | Freq: Three times a day (TID) | ORAL | Status: AC
  Administered 2012-11-28 (×2): 40 meq
  Filled 2012-11-28 (×2): qty 30

## 2012-11-28 MED ORDER — ISOSORBIDE MONONITRATE 20 MG PO TABS: 30.0000 mg | ORAL_TABLET | Freq: Two times a day (BID) | ORAL | Status: DC

## 2012-11-28 MED ORDER — PIPERACILLIN-TAZOBACTAM 3.375 G IVPB
3.3750 g | Freq: Three times a day (TID) | INTRAVENOUS | Status: DC
  Administered 2012-11-28 – 2012-12-02 (×12): 3.375 g via INTRAVENOUS
  Filled 2012-11-28 (×13): qty 50

## 2012-11-28 MED ORDER — PROPOFOL 10 MG/ML IV EMUL
INTRAVENOUS | Status: AC
  Administered 2012-11-28: 20 mg via INTRAVENOUS
  Filled 2012-11-28: qty 100

## 2012-11-28 MED ORDER — CHLORHEXIDINE GLUCONATE 0.12 % MT SOLN
15.0000 mL | Freq: Two times a day (BID) | OROMUCOSAL | Status: DC
  Administered 2012-11-28 – 2012-12-07 (×17): 15 mL via OROMUCOSAL
  Filled 2012-11-28 (×19): qty 15

## 2012-11-28 MED ORDER — MIDAZOLAM HCL 2 MG/2ML IJ SOLN
1.0000 mg | INTRAMUSCULAR | Status: DC | PRN
  Administered 2012-11-28 (×2): 2 mg via INTRAVENOUS

## 2012-11-28 MED ORDER — BIOTENE DRY MOUTH MT LIQD
15.0000 mL | Freq: Four times a day (QID) | OROMUCOSAL | Status: DC
  Administered 2012-11-29 – 2012-12-02 (×16): 15 mL via OROMUCOSAL

## 2012-11-28 MED ORDER — POTASSIUM CHLORIDE 10 MEQ/100ML IV SOLN
10.0000 meq | INTRAVENOUS | Status: DC
  Filled 2012-11-28 (×3): qty 100

## 2012-11-28 MED ORDER — SODIUM CHLORIDE 0.9 % IV SOLN
INTRAVENOUS | Status: DC
  Administered 2012-11-29: 07:00:00 via INTRAVENOUS
  Administered 2012-11-30: 1000 mL via INTRAVENOUS
  Administered 2012-12-02: 500 mL via INTRAVENOUS
  Administered 2012-12-04: 20 mL/h via INTRAVENOUS

## 2012-11-28 MED ORDER — WARFARIN SODIUM 4 MG PO TABS
4.0000 mg | ORAL_TABLET | Freq: Once | ORAL | Status: AC
  Administered 2012-11-28: 4 mg
  Filled 2012-11-28: qty 1

## 2012-11-28 MED ORDER — HYDRALAZINE HCL 20 MG/ML IJ SOLN
10.0000 mg | INTRAMUSCULAR | Status: DC | PRN
  Administered 2012-11-28 – 2012-12-06 (×2): 10 mg via INTRAVENOUS
  Filled 2012-11-28: qty 1

## 2012-11-28 MED ORDER — INSULIN ASPART 100 UNIT/ML ~~LOC~~ SOLN
2.0000 [IU] | SUBCUTANEOUS | Status: DC
  Administered 2012-11-28: 2 [IU] via SUBCUTANEOUS
  Administered 2012-11-28 – 2012-11-29 (×3): 4 [IU] via SUBCUTANEOUS
  Administered 2012-11-29 (×2): 6 [IU] via SUBCUTANEOUS
  Administered 2012-11-29: 2 [IU] via SUBCUTANEOUS
  Administered 2012-11-29: 6 [IU] via SUBCUTANEOUS
  Administered 2012-11-29 – 2012-11-30 (×3): 4 [IU] via SUBCUTANEOUS
  Administered 2012-11-30 (×4): 6 [IU] via SUBCUTANEOUS
  Administered 2012-12-01 (×2): 4 [IU] via SUBCUTANEOUS
  Administered 2012-12-01: 6 [IU] via SUBCUTANEOUS
  Administered 2012-12-01 (×2): 4 [IU] via SUBCUTANEOUS
  Administered 2012-12-01: 6 [IU] via SUBCUTANEOUS
  Administered 2012-12-01 – 2012-12-02 (×5): 4 [IU] via SUBCUTANEOUS
  Administered 2012-12-02 – 2012-12-03 (×3): 2 [IU] via SUBCUTANEOUS

## 2012-11-28 MED ORDER — WARFARIN SODIUM 5 MG IV SOLR
4.0000 mg | Freq: Once | INTRAVENOUS | Status: DC
  Filled 2012-11-28: qty 2

## 2012-11-28 MED ORDER — FENTANYL CITRATE 0.05 MG/ML IJ SOLN
25.0000 ug | INTRAMUSCULAR | Status: DC | PRN
  Administered 2012-11-28 – 2012-12-01 (×4): 50 ug via INTRAVENOUS
  Filled 2012-11-28 (×3): qty 2

## 2012-11-28 MED ORDER — VANCOMYCIN HCL 10 G IV SOLR
1250.0000 mg | INTRAVENOUS | Status: DC
  Administered 2012-11-28 – 2012-11-30 (×3): 1250 mg via INTRAVENOUS
  Filled 2012-11-28 (×4): qty 1250

## 2012-11-28 MED ORDER — ISOSORBIDE DINITRATE 30 MG PO TABS
30.0000 mg | ORAL_TABLET | Freq: Two times a day (BID) | ORAL | Status: DC
  Administered 2012-11-28: 30 mg
  Filled 2012-11-28 (×3): qty 1

## 2012-11-28 MED ORDER — PANTOPRAZOLE SODIUM 40 MG IV SOLR
40.0000 mg | Freq: Every day | INTRAVENOUS | Status: DC
  Administered 2012-11-28 – 2012-12-02 (×5): 40 mg via INTRAVENOUS
  Filled 2012-11-28 (×9): qty 40

## 2012-11-28 MED ORDER — INSULIN ASPART 100 UNIT/ML ~~LOC~~ SOLN
0.0000 [IU] | Freq: Three times a day (TID) | SUBCUTANEOUS | Status: DC
  Administered 2012-11-28: 2 [IU] via SUBCUTANEOUS

## 2012-11-28 MED ORDER — SODIUM CHLORIDE 0.9 % IV SOLN: INTRAVENOUS | Status: DC

## 2012-11-28 NOTE — Progress Notes (Signed)
ANTICOAGULATION CONSULT NOTE - Follow Up Consult  Pharmacy Consult for warfarin Indication: h/o pulmonary embolism, afib  Allergies  Allergen Reactions  . Hydromorphone Hcl Other (See Comments)    Dilaudid caused Confusion   . Morphine Other (See Comments)    confusion  . Pregabalin Other (See Comments)    lyrica - caused hallucinations    Labs:  Recent Labs  11/26/12 0014 11/26/12 0218 11/26/12 0353 11/26/12 0810 11/26/12 1349 11/27/12 0538 11/28/12 0506  HGB 11.4*  --   --   --   --   --  13.0  HCT 38.7*  --   --   --   --   --  41.6  PLT 474*  --   --   --   --   --  453*  LABPROT  --   --  25.2*  --   --  27.3* 27.1*  INR  --   --  2.38*  --   --  2.64* 2.62*  CREATININE 1.06  --   --   --   --  1.25 1.45*  TROPONINI  --  <0.30  --  <0.30 <0.30  --   --     Assessment: 7 yom recently discharged from [facility] 8/16 for sepsis 2/2 pseudomonas UTI presented 8/21 with progressive SOB, productive cough. Patient with h/o afib and PE on chronic warfarin. Patient was reportedly on warfarin 5mg  po daily with last dose 8/21.  INR (2.62) remains therapeutic today.    CBC stable, no bleeding reported.  DDI with Ciprofloxacin noted - patient was started on Ciprofloxacin 8/16, anticipated to continue until 8/26.   Diet changed to Dysphagia 1 on 8/23, but per RN is minimally responsive and will need warfarin IV again tonight.    Goal of Therapy:  INR 2-3 Monitor platelets by anticoagulation protocol: Yes   Plan:   Warfarin 4mg  IV PO tonight.  Daily PT/INR  Pharmacy will f/u daily.   Lynann Beaver PharmD, BCPS Pager [phone] 11/28/2012 9:06 AM

## 2012-11-28 NOTE — Progress Notes (Deleted)
Brief Cardiology f/u note.  Chart reviewed.  I have read Dr. Lenise Arena' note.  As noted yesterday -- will need to judiciously manage his diuretic dose.  I agree with her assessment. I would simply hold the PM dose of IV Lasix today in an attempt to stop the swinging pendulum.  His CXR looks better & he has had brisk diuresis.  We will see him again tomorrow to assist with determining oral dosing, but for now would plan 40mg  PO BID starting in the AM & adjust according to UOP & trend in renal function.  Marykay Lex, MD

## 2012-11-28 NOTE — Progress Notes (Signed)
CRITICAL VALUE ALERT  Critical value received:  CO2 43  Date of notification:  11/28/2012  Time of notification:  1040  Critical value read back: yes  Nurse who received alert:  Fenton Foy  MD notified (1st page):  Dr. Molli Knock Time of first page: 1041   MD notified (2nd page):  Time of second page:  Responding MD: Dr. Molli Knock  Time MD responded:  [phone]

## 2012-11-28 NOTE — Progress Notes (Signed)
Patient noted by NT to be difficult to arouse. Checked on patient and found him to be severely obtunded. Patient does respond to painful stimuli but only briefly. Dr. Lenise Arena paged. Charge nurse Shanda Bumps paged to room and decision made to call rapid response. Erskin Burnet RN

## 2012-11-28 NOTE — Progress Notes (Signed)
ANTIBIOTIC CONSULT NOTE - INITIAL  Pharmacy Consult for vancomycin/Zosyn Indication: HCAP vs aspiration PNA  Allergies  Allergen Reactions  . Hydromorphone Hcl Other (See Comments)    Dilaudid caused Confusion   . Morphine Other (See Comments)    confusion  . Pregabalin Other (See Comments)    lyrica - caused hallucinations    Patient Measurements: Height: 5\' 8"  (172.7 cm) Weight: 210 lb 8 oz (95.482 kg) IBW/kg (Calculated) : 68.4  Vital Signs: Temp: 98.3 F (36.8 C) (08/24 0815) Temp src: Oral (08/24 0815) BP: 139/83 mmHg (08/24 0815) Pulse Rate: 88 (08/24 0815) Intake/Output from previous day: 08/23 0701 - 08/24 0700 In: 200 [P.O.:200] Out: 825 [Urine:825] Intake/Output from this shift:    Labs:  Recent Labs  11/26/12 0014 11/27/12 0538 11/28/12 0506  WBC 17.0*  --  12.8*  HGB 11.4*  --  13.0  PLT 474*  --  453*  CREATININE 1.06 1.25 1.45*   Estimated Creatinine Clearance: 44.8 ml/min (by C-G formula based on Cr of 1.45). No results found for this basename: VANCOTROUGH, Leodis Binet, VANCORANDOM, GENTTROUGH, GENTPEAK, GENTRANDOM, TOBRATROUGH, TOBRAPEAK, TOBRARND, AMIKACINPEAK, AMIKACINTROU, AMIKACIN,  in the last 72 hours   Microbiology: Recent Results (from the past 720 hour(s))  URINE CULTURE     Status: None   Collection Time    11/15/12  8:17 PM      Result Value Range Status   Specimen Description URINE, RANDOM   Final   Special Requests NONE   Final   Culture  Setup Time     Final   Value: 11/16/2012 06:12     Performed at [facility] Count     Final   Value: >=100,000 COLONIES/ML     Performed at [facility]   Culture     Final   Value: PSEUDOMONAS AERUGINOSA     Performed at [facility]   Report Status 11/17/2012 FINAL   Final   Organism ID, Bacteria PSEUDOMONAS AERUGINOSA   Final  CULTURE, BLOOD (ROUTINE X 2)     Status: None   Collection Time    11/15/12  8:28 PM      Result Value Range Status    Specimen Description BLOOD RIGHT HAND   Final   Special Requests BOTTLES DRAWN AEROBIC AND ANAEROBIC   Final   Culture  Setup Time     Final   Value: 11/16/2012 06:10     Performed at [facility]   Culture     Final   Value: NO GROWTH 5 DAYS     Performed at [facility]   Report Status 11/22/2012 FINAL   Final  CULTURE, BLOOD (ROUTINE X 2)     Status: None   Collection Time    11/15/12  8:44 PM      Result Value Range Status   Specimen Description BLOOD LEFT HAND   Final   Special Requests BOTTLES DRAWN AEROBIC AND ANAEROBIC   Final   Culture  Setup Time     Final   Value: 11/16/2012 06:11     Performed at [facility]   Culture     Final   Value: NO GROWTH 5 DAYS     Performed at [facility]   Report Status 11/22/2012 FINAL   Final  MRSA PCR SCREENING     Status: Abnormal   Collection Time    11/16/12 12:11 AM      Result Value  Range Status   MRSA by PCR POSITIVE (*) NEGATIVE Final   Comment:            The GeneXpert MRSA Assay (FDA     approved for NASAL specimens     only), is one component of a     comprehensive MRSA colonization     surveillance program. It is not     intended to diagnose MRSA     infection nor to guide or     monitor treatment for     MRSA infections.     RESULT CALLED TO, READ BACK BY AND VERIFIED WITH:     STOCKS,M/2W @0252  ON 11/16/12 BY KARCZEWSKI,S.  MRSA PCR SCREENING     Status: Abnormal   Collection Time    11/26/12  4:51 AM      Result Value Range Status   MRSA by PCR POSITIVE (*) NEGATIVE Final   Comment:            The GeneXpert MRSA Assay (FDA     approved for NASAL specimens     only), is one component of a     comprehensive MRSA colonization     surveillance program. It is not     intended to diagnose MRSA     infection nor to guide or     monitor treatment for     MRSA infections.     RESULT CALLED TO, READ BACK BY AND VERIFIED WITH:     A. MOORE RN AT 1610 ON 08.22.14 BY SHUEA     Medical History: Past Medical History  Diagnosis Date  . Diabetes mellitus   . Hypertension   . Hypercholesterolemia   . Esophageal stricture   . Pulmonary embolism 2008    chronic coumadin   . Coronary artery disease     DR. BERRY IS PT'S CARDIOLOGIST  . Atrial fibrillation     CHRONIC COUMADIN-pemanent atrial fib  . Anxiety     SINCE 1975   . Depression     SINCE 1975    . Shortness of breath     NOT SURE WHAT CAUSES SOB - BUT HE IS NOT ABLE TO BE ACTIVE - AND IS SOB WITH ANY ACTIVITY - USES OXYGEN 2 L NASAL CANNULA - ALL THE TIME -EXCEPT WHEN IN SHOWER OR CHANGING CLOTHES on home 02  . Peripheral vascular disease     TOLD SOME SMALL AMOUNT OF BLOCKAGE IN LEG WHEN HEART CATH DONE 2008  . Bladder tumor     HAS HAD HEMATURIA OFF AND ON - HAS FOLEY CATH THAT WAS PLACED JUNE 20TH, 2014  . GERD (gastroesophageal reflux disease)   . Edema     FEET AND LEGS MOST DAYS - SOMETIMES WEARS COMPRESSION HOSE  . Cancer     SKIN CANCERS  . Arthritis     S/P BILATERAL TOTAL KNEE REPLACEMENTS - BUT BOTH KNEES PAINFUL AND JOINTS WORN OUT; BAD RIGHT HIP - BUT NOT A CANDIDATE FOR HIP PREPLACMENT;  HAS SEVERE LOWER  BACK AND LEG PAIN - HAS SPINAL STENOSIS AND BULGING DISC; CURVATURE OF UPPER SPINE - UNABLE TO LAY HIS HEAD FLAT.  Marland Kitchen Pneumonia 2005  . Difficult intubation     DUE TO LIMITED NECK FLEXION AND SHORT NECK--ANESTHESIA RECORD FROM 2007 VATS SURGERY AT [facility] OBTAINED AND ON PT'S CHART.  Marland Kitchen Problems with hearing     WEARS BILATERAL HEARING AIDS    Medications:  Scheduled:  . chlordiazePOXIDE  10 mg Oral  TID  . DULoxetine  60 mg Oral QHS  . furosemide  40 mg Intravenous BID  . insulin aspart  2-6 Units Subcutaneous Q4H  . irbesartan  150 mg Oral Q breakfast  . isosorbide mononitrate  60 mg Oral Q supper  . ketorolac  30 mg Intravenous Once  . nutrition supplement  1 packet Oral BID BM  . pantoprazole (PROTONIX) IV  40 mg Intravenous QHS  . potassium chloride  40 mEq Per Tube TID   . senna-docusate  1 tablet Oral BID  . sodium chloride  3 mL Intravenous Q12H  . Warfarin - Pharmacist Dosing Inpatient   Does not apply q1800  . warfarin IV  4 mg Intravenous ONCE-1800   Infusions:  . sodium chloride     Assessment: 79 yo male admitted with resp failure just discharged recently on 8/16 after an admission for pseudomonas UTI now to treat HCAP vs aspiration PNA with vancomycin and Zosyn  Est CrCl 45 (CG) and 40 (N) - SCr rising  WBC improving  Goal of Therapy:  Vancomycin trough level 15-20 mcg/ml  Plan:  1) Vancomycin 1250mg  IV q24 2) Zosyn 3.375g IV q8 (extended interval infusion)   Hessie Knows, PharmD, BCPS Pager [phone] 11/28/2012 10:36 AM

## 2012-11-28 NOTE — Progress Notes (Signed)
Called report to ICU RN and patient will be transferring to 1223. Erskin Burnet RN

## 2012-11-28 NOTE — Progress Notes (Signed)
Pt had 11 beats V tach at 0110.  Asymptomatic.  None since then.  Berton Bon RN-BC

## 2012-11-28 NOTE — Procedures (Signed)
Intubation Procedure Note Frederick [name] [id number] 11/14/1929  Procedure: Intubation Indications: Respiratory insufficiency  Procedure Details Consent: Risks of procedure as well as the alternatives and risks of each were explained to the (patient/caregiver).  Consent for procedure obtained. Time Out: Verified patient identification, verified procedure, site/side was marked, verified correct patient position, special equipment/implants available, medications/allergies/relevent history reviewed, required imaging and test results available.  Performed  Maximum sterile technique was used including antiseptics, gloves, hand hygiene and mask.  MAC    Evaluation Hemodynamic Status: BP stable throughout; O2 sats: stable throughout Patient's Current Condition: stable Complications: No apparent complications Patient did tolerate procedure well. Chest X-ray ordered to verify placement.  CXR: pending.  Very anterior.  Frederick [name] 11/28/2012

## 2012-11-28 NOTE — Procedures (Signed)
Orogastric tube placement  Patient with history of dysphagia and esophageal strictures.  Difficulty placement blindly.  Glidescope used and placed orally without difficulty.    Alyson Reedy, M.D. [address] [facility] Pulmonary/Critical Care Medicine. Pager: [phone]. After hours pager: [phone].

## 2012-11-28 NOTE — Procedures (Signed)
Central Venous Catheter Insertion Procedure Note Beck Cofer [id number] May 20, 1929  Procedure: Insertion of Central Venous Catheter Indications: Assessment of intravascular volume, Drug and/or fluid administration and Frequent blood sampling  Procedure Details Consent: Unable to obtain consent because of emergent medical necessity. Time Out: Verified patient identification, verified procedure, site/side was marked, verified correct patient position, special equipment/implants available, medications/allergies/relevent history reviewed, required imaging and test results available.  Performed  Maximum sterile technique was used including antiseptics, cap, gloves, gown, hand hygiene, mask and sheet. Skin prep: Chlorhexidine; local anesthetic administered A antimicrobial bonded/coated triple lumen catheter was placed in the right internal jugular vein using the Seldinger technique.  Evaluation Blood flow good Complications: No apparent complications Patient did tolerate procedure well. Chest X-ray ordered to verify placement.  CXR: pending.  U/S used in placement.  Waleska Buttery 11/28/2012, 10:29 AM

## 2012-11-28 NOTE — Progress Notes (Signed)
Called to floor by primary RN for decreased level of consciousness. Vital signs stable.  Arousable to sternal rub; answers "yes/no" baseline dementia.  A/O to self only.  Primary MD called.  Wife at bedside. ABG and portable chest Xray obtained.  Per ABG pt has hypoxic hypercarbic respiratory failure, MD notified CCM and patient transported to room 1223 for further assessment and monitoring.

## 2012-11-28 NOTE — Procedures (Signed)
Arterial Catheter Insertion Procedure Note Frederick [name] [id number] 07/24/1929  Procedure: Insertion of Arterial Catheter  Indications: Blood pressure monitoring and Frequent blood sampling  Procedure Details Consent: Unable to obtain consent because of emergent medical necessity. Time Out: Verified patient identification, verified procedure, site/side was marked, verified correct patient position, special equipment/implants available, medications/allergies/relevent history reviewed, required imaging and test results available.  Performed  Maximum sterile technique was used including antiseptics, cap, gloves, gown, hand hygiene, mask and sheet. Skin prep: Chlorhexidine; local anesthetic administered 20 gauge catheter was inserted into right radial artery using the Seldinger technique.  Evaluation Blood flow good; BP tracing good. Complications: No apparent complications.   Frederick [name] 11/28/2012

## 2012-11-28 NOTE — Progress Notes (Signed)
eLink Physician-Brief Progress Note Patient Name: Frederick [name] DOB: 01/16/1930 MRN: [id number]  Date of Service  11/28/2012   HPI/Events of Note     eICU Interventions   Imdur changed to immediate release     Intervention Category Intermediate Interventions: Communication with other healthcare providers and/or family  Lonia Farber 11/28/2012, 4:46 PM

## 2012-11-28 NOTE — Consult Note (Signed)
PULMONARY  / CRITICAL CARE MEDICINE  Name: Frederick [name] MRN: [id number] DOB: June 09, 1929    ADMISSION DATE:  11/25/2012 CONSULTATION DATE:  11/28/2012  REFERRING MD :  [facility] PRIMARY SERVICE: [facility]  CHIEF COMPLAINT:  Respiratory failure  BRIEF PATIENT DESCRIPTION: 79 year old male with history of advance dementia that is essentially non-verbal presenting to the hospital with respiratory failure and fluid overload.  Also significant history of dysphagia and ?aspiration.  On 8/24 the patient had significant AMS, very somnolent and barely arousable.  ABG revealed hypercarbic and hypoxic respiratory failure.  SIGNIFICANT EVENTS / STUDIES:  8/24 VDRF requiring intubation.  LINES / TUBES: ET Tube 8/24>>> OGT 8/24>>>  RIJ TLC 8/24>>> R radial a-line 8/24>>>  CULTURES: Blood 8/24>>> Urine 8/24>>> Sputum 8/24>>>  ANTIBIOTICS: Vanc 8/24>>> Zosyn 8/24>>>  PAST MEDICAL HISTORY :  Past Medical History  Diagnosis Date  . Diabetes mellitus   . Hypertension   . Hypercholesterolemia   . Esophageal stricture   . Pulmonary embolism 2008    chronic coumadin   . Coronary artery disease     DR. BERRY IS PT'S CARDIOLOGIST  . Atrial fibrillation     CHRONIC COUMADIN-pemanent atrial fib  . Anxiety     SINCE 1975   . Depression     SINCE 1975    . Shortness of breath     NOT SURE WHAT CAUSES SOB - BUT HE IS NOT ABLE TO BE ACTIVE - AND IS SOB WITH ANY ACTIVITY - USES OXYGEN 2 L NASAL CANNULA - ALL THE TIME -EXCEPT WHEN IN SHOWER OR CHANGING CLOTHES on home 02  . Peripheral vascular disease     TOLD SOME SMALL AMOUNT OF BLOCKAGE IN LEG WHEN HEART CATH DONE 2008  . Bladder tumor     HAS HAD HEMATURIA OFF AND ON - HAS FOLEY CATH THAT WAS PLACED JUNE 20TH, 2014  . GERD (gastroesophageal reflux disease)   . Edema     FEET AND LEGS MOST DAYS - SOMETIMES WEARS COMPRESSION HOSE  . Cancer     SKIN CANCERS  . Arthritis     S/P BILATERAL TOTAL KNEE REPLACEMENTS - BUT BOTH KNEES PAINFUL AND JOINTS  WORN OUT; BAD RIGHT HIP - BUT NOT A CANDIDATE FOR HIP PREPLACMENT;  HAS SEVERE LOWER  BACK AND LEG PAIN - HAS SPINAL STENOSIS AND BULGING DISC; CURVATURE OF UPPER SPINE - UNABLE TO LAY HIS HEAD FLAT.  Marland Kitchen Pneumonia 2005  . Difficult intubation     DUE TO LIMITED NECK FLEXION AND SHORT NECK--ANESTHESIA RECORD FROM 2007 VATS SURGERY AT [facility] OBTAINED AND ON PT'S CHART.  Marland Kitchen Problems with hearing     WEARS BILATERAL HEARING AIDS   Past Surgical History  Procedure Laterality Date  . Shoulder surgery      bilateral  . Pancreas surgery  2001    TUMOR REMOVED FROM PANCREAS AND SPLEEN ALSO REMOVED  . Total knee arthroplasty      bilateral  . Lung surgery  2007    non-cancer  VIDEO ASSISTED THORCOTOMY TO REMOVE FLUID / DECORTICATION. DR. Edwyna Shell  . Basil cell    . Cardiac catheterization  2008    totally occluded nondominant LCX wth mod. ostial RCA disease and nl. EF  . Esophageal stretch    . Skin cancer removed from left side of head - required skin graft from left leg    . Cartilage repair 198- left knee    . Joint replacement    . Transurethral resection of  bladder tumor with gyrus (turbt-gyrus) N/A 11/03/2012    Procedure: TRANSURETHRAL RESECTION OF BLADDER TUMOR WITH GYRUS (TURBT-GYRUS);  Surgeon: Milford Cage, MD;  Location: [facility];  Service: Urology;  Laterality: N/A;  . Cystoscopy/retrograde/ureteroscopy  11/03/2012    Procedure: CYSTOSCOPY/RETROGRADE/ LEFT URETEROSCOPY AND STENT PLACEMENT;  Surgeon: Milford Cage, MD;  Location: [facility];  Service: Urology;;  . Theador Hawthorne N/A 11/03/2012    Procedure: CYSTOGRAM;  Surgeon: Milford Cage, MD;  Location: [facility];  Service: Urology;  Laterality: N/A;   Prior to Admission medications   Medication Sig Start Date End Date Taking? Authorizing Provider  atorvastatin (LIPITOR) 40 MG tablet Take 40 mg by mouth every evening.     Yes Historical Provider, MD  chlordiazePOXIDE (LIBRIUM) 10 MG capsule Take 10 mg by mouth 3 (three)  times daily.     Yes Historical Provider, MD  ciprofloxacin (CIPRO) 500 MG tablet Take 1 tablet (500 mg total) by mouth 2 (two) times daily. 11/20/12 11/30/12 Yes Vassie Loll, MD  DULoxetine (CYMBALTA) 60 MG capsule Take 60 mg by mouth at bedtime.     Yes Historical Provider, MD  furosemide (LASIX) 40 MG tablet Take 40-80 mg by mouth 2 (two) times daily. 2 tablets at breakfast and 1 at dinner 11/20/12  Yes Vassie Loll, MD  glipiZIDE (GLUCOTROL) 10 MG tablet Take 10 mg by mouth daily.    Yes Historical Provider, MD  HYDROcodone-acetaminophen (NORCO/VICODIN) 5-325 MG per tablet Take 1-2 tablets by mouth every 4 (four) hours as needed for pain. 11/20/12  Yes Vassie Loll, MD  hyoscyamine (LEVSIN, ANASPAZ) 0.125 MG tablet Take 1 tablet (0.125 mg total) by mouth every 4 (four) hours as needed for cramping (bladder spasms). 11/03/12  Yes Milford Cage, MD  insulin glargine (LANTUS) 100 UNIT/ML injection Inject 46 Units into the skin daily. Daily with breakfast   Yes Historical Provider, MD  insulin lispro (HUMALOG) 100 UNIT/ML injection Inject 2-4 Units into the skin daily before breakfast. Sliding scale   Yes Historical Provider, MD  isosorbide mononitrate (IMDUR) 60 MG 24 hr tablet Take 60 mg by mouth daily with supper.    Yes Historical Provider, MD  lansoprazole (PREVACID) 30 MG capsule Take 30 mg by mouth daily. 02/14/11 11/15/13 Yes Louis Meckel, MD  liver oil-zinc oxide (DESITIN) 40 % ointment Apply 1 application topically daily as needed for dry skin. Apply to bottom   Yes Historical Provider, MD  phenazopyridine (PYRIDIUM) 100 MG tablet Take 1 tablet (100 mg total) by mouth every 8 (eight) hours as needed for pain (Burning urination.  Will turn urine and body fluids orange.). 11/03/12  Yes Milford Cage, MD  potassium chloride SA (K-DUR,KLOR-CON) 20 MEQ tablet Take 1 tablet (20 mEq total) by mouth daily. 11/20/12  Yes Vassie Loll, MD  senna-docusate (SENOKOT S) 8.6-50 MG per tablet  Take 1 tablet by mouth 2 (two) times daily. 11/03/12  Yes Milford Cage, MD  warfarin (COUMADIN) 5 MG tablet Take 5 mg by mouth daily.    Yes Historical Provider, MD  irbesartan (AVAPRO) 150 MG tablet Take 150 mg by mouth daily with breakfast. -HOLD UNTIL FOLLOW UP WITH PRIMARY CARE DOCTOR 11/20/12   Vassie Loll, MD  metolazone (ZAROXOLYN) 5 MG tablet Take 1.5 tablets (7.5 mg total) by mouth daily. -HOLD UNTIL FOLLOW UP WITH PRIMARY CARE DOCTOR 11/20/12   Vassie Loll, MD   Allergies  Allergen Reactions  . Hydromorphone Hcl Other (See Comments)    Dilaudid caused  Confusion   . Morphine Other (See Comments)    confusion  . Pregabalin Other (See Comments)    lyrica - caused hallucinations   FAMILY HISTORY:  Family History  Problem Relation Age of Onset  . Heart disease Mother   . Melanoma Father    SOCIAL HISTORY:  reports that he has never smoked. He has never used smokeless tobacco. He reports that he does not drink alcohol or use illicit drugs.  REVIEW OF SYSTEMS:  Unattainable, patient is obtunded.  SUBJECTIVE:   VITAL SIGNS: Temp:  [97.8 F (36.6 C)-99.5 F (37.5 C)] 98.3 F (36.8 C) (08/24 0815) Pulse Rate:  [88-111] 88 (08/24 0815) Resp:  [18-24] 20 (08/24 0815) BP: (96-145)/(46-88) 139/83 mmHg (08/24 0815) SpO2:  [93 %-99 %] 99 % (08/24 0815) Weight:  [95.482 kg (210 lb 8 oz)] 95.482 kg (210 lb 8 oz) (08/24 0341) HEMODYNAMICS:   VENTILATOR SETTINGS:   INTAKE / OUTPUT: Intake/Output     08/23 0701 - 08/24 0700 08/24 0701 - 08/25 0700   P.O. 200    I.V. (mL/kg)     Total Intake(mL/kg) 200 (2.1)    Urine (mL/kg/hr) 825 (0.4)    Total Output 825     Net -625          Stool Occurrence 2 x      PHYSICAL EXAMINATION: General:  Chronically ill appearing elderly male, NAD. Neuro:  Sedated and intubated, withdraw all ext to pain but not command. HEENT:  [place]/AT, PERRL, EOM-I and bloody mucous membranes. Cardiovascular:  RRR, Nl S1/S2, -M/R/G. Lungs:   Coarse BS diffusely L>R. Abdomen:  Soft, NT, ND and +BS. Musculoskeletal:  -edema and -tenderness. Skin:  Intact.  LABS:  CBC Recent Labs     11/26/12  0014  11/28/12  0506  WBC  17.0*  12.8*  HGB  11.4*  13.0  HCT  38.7*  41.6  PLT  474*  453*   Coag's Recent Labs     11/26/12  0353  11/27/12  0538  11/28/12  0506  INR  2.38*  2.64*  2.62*   BMET Recent Labs     11/26/12  0014  11/27/12  0538  11/28/12  0506  NA  142  142  140  K  3.7  3.1*  3.3*  CL  95*  90*  90*  CO2  42*  44*  43*  BUN  18  23  41*  CREATININE  1.06  1.25  1.45*  GLUCOSE  71  92  131*   Electrolytes Recent Labs     11/26/12  0014  11/26/12  0218  11/27/12  0538  11/28/12  0506  CALCIUM  9.7   --   10.0  9.8  MG   --   1.9  1.7  1.9   Sepsis Markers No results found for this basename: LACTICACIDVEN, PROCALCITON, O2SATVEN,  in the last 72 hours ABG Recent Labs     11/28/12  0842  PHART  7.360  PCO2ART  80.3*  PO2ART  63.6*   Liver Enzymes No results found for this basename: AST, ALT, ALKPHOS, BILITOT, ALBUMIN,  in the last 72 hours Cardiac Enzymes Recent Labs     11/26/12  0218  11/26/12  0810  11/26/12  1349  11/27/12  0538  TROPONINI  <0.30  <0.30  <0.30   --   PROBNP   --    --    --   1393.0*   Glucose  Recent Labs     11/27/12  1205  11/27/12  1839  11/27/12  2011  11/27/12  2340  11/28/12  0347  11/28/12  0742  GLUCAP  124*  165*  286*  291*  103*  185*    Imaging Dg Swallowing Func-speech Pathology  11/27/2012   Lacinda Axon, CCC-SLP     11/27/2012  6:07 PM Objective Swallowing Evaluation: Modified Barium Swallowing Study   Patient Details  Name: Alexa Golebiewski MRN: [id number] Date of Birth: 1929/04/30  Today's Date: 11/27/2012 Time: 1700-1730 SLP Time Calculation (min): 30 min  Past Medical History:  Past Medical History  Diagnosis Date  . Diabetes mellitus   . Hypertension   . Hypercholesterolemia   . Esophageal stricture   . Pulmonary embolism 2008     chronic coumadin   . Coronary artery disease     DR. BERRY IS PT'S CARDIOLOGIST  . Atrial fibrillation     CHRONIC COUMADIN-pemanent atrial fib  . Anxiety     SINCE 1975   . Depression     SINCE 1975    . Shortness of breath     NOT SURE WHAT CAUSES SOB - BUT HE IS NOT ABLE TO BE ACTIVE -  AND IS SOB WITH ANY ACTIVITY - USES OXYGEN 2 L NASAL CANNULA -  ALL THE TIME -EXCEPT WHEN IN SHOWER OR CHANGING CLOTHES on home  02  . Peripheral vascular disease     TOLD SOME SMALL AMOUNT OF BLOCKAGE IN LEG WHEN HEART CATH DONE  2008  . Bladder tumor     HAS HAD HEMATURIA OFF AND ON - HAS FOLEY CATH THAT WAS PLACED  JUNE 20TH, 2014  . GERD (gastroesophageal reflux disease)   . Edema     FEET AND LEGS MOST DAYS - SOMETIMES WEARS COMPRESSION HOSE  . Cancer     SKIN CANCERS  . Arthritis     S/P BILATERAL TOTAL KNEE REPLACEMENTS - BUT BOTH KNEES PAINFUL  AND JOINTS WORN OUT; BAD RIGHT HIP - BUT NOT A CANDIDATE FOR HIP  PREPLACMENT;  HAS SEVERE LOWER  BACK AND LEG PAIN - HAS SPINAL  STENOSIS AND BULGING DISC; CURVATURE OF UPPER SPINE - UNABLE TO  LAY HIS HEAD FLAT.  Marland Kitchen Pneumonia 2005  . Difficult intubation     DUE TO LIMITED NECK FLEXION AND SHORT NECK--ANESTHESIA RECORD  FROM 2007 VATS SURGERY AT [facility] OBTAINED AND ON PT'S CHART.  Marland Kitchen Problems with hearing     WEARS BILATERAL HEARING AIDS   Past Surgical History:  Past Surgical History  Procedure Laterality Date  . Shoulder surgery      bilateral  . Pancreas surgery  2001    TUMOR REMOVED FROM PANCREAS AND SPLEEN ALSO REMOVED  . Total knee arthroplasty      bilateral  . Lung surgery  2007    non-cancer  VIDEO ASSISTED THORCOTOMY TO REMOVE FLUID /  DECORTICATION. DR. Edwyna Shell  . Basil cell    . Cardiac catheterization  2008    totally occluded nondominant LCX wth mod. ostial RCA disease  and nl. EF  . Esophageal stretch    . Skin cancer removed from left side of head - required skin  graft from left leg    . Cartilage repair 198- left knee    . Joint replacement    . Transurethral  resection of bladder tumor with gyrus  (turbt-gyrus) N/A 11/03/2012    Procedure: TRANSURETHRAL RESECTION OF BLADDER TUMOR WITH GYRUS  (  TURBT-GYRUS);  Surgeon: Milford Cage, MD;  Location: [facility];  Service: Urology;  Laterality: N/A;  . Cystoscopy/retrograde/ureteroscopy  11/03/2012    Procedure: CYSTOSCOPY/RETROGRADE/ LEFT URETEROSCOPY AND STENT  PLACEMENT;  Surgeon: Milford Cage, MD;  Location: [facility];  Service: Urology;;  . Theador Hawthorne N/A 11/03/2012    Procedure: CYSTOGRAM;  Surgeon: Milford Cage, MD;   Location: [facility];  Service: Urology;  Laterality: N/A;   HPI:  Jesselee Poth is a 79 y.o. male who was just discharged from  our service 6 days ago after an admission for a UTI with  pseudomonas.  Since discharge he has getting progressively short  of breath until today he developed cough, productive of a whitish  sputum.  He has had no fever, no chills, no sweating since Monday  of this week.  In the ED while his WBC is elevated at 17k this is  significantly down from the >30k it was at discharge 6 days ago.   At the time of discharge he had been taken off of his Zaroxylyn  and had not yet restarted this, he did restart his lasix on  Tuesday.  MBS indicated following results of BSE completed on  11/25/12.       Assessment / Plan / Recommendation Clinical Impression  Dysphagia Diagnosis: Moderate oral phase dysphagia;Severe  pharyngeal phase dysphagia;Severe cervical esophageal phase  dysphagia;Suspected primary esophageal dysphagia  MBS completed.  Patient with head down posture with difficulty   maintaining  upright positon.  Assist required to reposition  patient throughout study as view blocked by shoulders.   Evaluation indicates moderate oral dysphagia marked by weak  lingual manipulation and reduced posterior propulsion.  Piecemeal  swallows with all consistencies.  Severe pharyngeal phase  dysphagia with suspected primary esophageal dysphagia.  Decreased  TBR resulting in  residuals in vallecular space with all  consistencies.  Reduced pharyngeal peristalsis.  Diffuse residue  from vallecular space to pyriforms s/p swallow of nectar barium  by cup and spoon, puree and mechanical soft consistency.   Prominent CP segment with pooling in pyriforms with all  consistencies prior to swallow with moderate amount of  containment s/p swallow.  Backflow into pharynx during swallow of  thin liquid barium by cup, straw and spoon with eventual  penetration s/p swallow from residuals.  Unable to confirm  aspiration with thins due to shoulders blocking view. Strategies  of multiple effortful swallows with puree consistency and nectar  thick liquids by straw  effective in clearing majority of  residuals but patient quickly fatigued. No backflow to pharynx  noted with puree and nectar thick barium.    Brief esophageal  sweep revealed significant amount of stasis with backflow to  cervical esophagus.  No radiologist present to confirm.  Patient  at high risk for aspiration with any consistency due to residuals  and risk of backflow of bolus into pharynx from cervical  esophagus.   MD given results of evaluation by treating SLP.  MD  confirmed to proceed with dysphagia 1 consistency and nectar  thick liquids in limited amounts with full supervision with  strict aspiration and reflux precautions.  Aspiration risk  remains even with modified diet.   Diagnostic treatment completed  following evaluation focusing on providing education to  caregivers on swallow strategies to maximize safety.   Patient  may benefit from GI consult to assess current esophageal  functioning.  ST to follow in acute care setting for diet  tolerance and  POC.   Recommend repeat MBS following results of GI  consult.         Diet Recommendation Dysphagia 1 (Puree);Nectar-thick liquid   Liquid Administration via: Straw;Cup Medication Administration: Via alternative means Supervision: Patient able to self feed;Full supervision/cueing   for compensatory strategies Compensations: Slow rate;Small sips/bites;Multiple dry swallows  after each bite/sip;Clear throat intermittently;Hard cough after  swallow;Effortful swallow Postural Changes and/or Swallow Maneuvers: Seated upright 90  degrees;Upright 30-60 min after meal    Other  Recommendations Recommended Consults: Consider GI  evaluation Oral Care Recommendations: Oral care before and after PO Other Recommendations: Order thickener from pharmacy;Prohibited  food (jello, ice cream, thin soups);Have oral suction  available;Clarify dietary restrictions   Follow Up Recommendations  Outpatient SLP    Frequency and Duration min 2x/week  2 weeks       SLP Swallow Goals Patient will utilize recommended strategies during swallow to  increase swallowing safety with: Total assistance   General Date of Onset: 11/26/12 HPI: Khayman Kirsch is a 79 y.o. male who was just discharged  from our service 6 days ago after an admission for a UTI with  pseudomonas.  Since discharge he has getting progressively short  of breath until today he developed cough, productive of a whitish  sputum.  He has had no fever, no chills, no sweating since Monday  of this week.  In the ED while his WBC is elevated at 17k this is  significantly down from the >30k it was at discharge 6 days ago.   At the time of discharge he had been taken off of his Zaroxylyn  and had not yet restarted this, he did restart his lasix on  Tuesday. Type of Study: Modified Barium Swallowing Study Reason for Referral: Objectively evaluate swallowing function Previous Swallow Assessment: BSE 11/26/12 Diet Prior to this Study: NPO Temperature Spikes Noted: No Respiratory Status: Supplemental O2 delivered via (comment) History of Recent Intubation: No Behavior/Cognition: Alert;Cooperative;Hard of hearing;Requires  cueing Oral Cavity - Dentition: Missing dentition Oral Motor / Sensory Function: Impaired - see Bedside swallow  eval Self-Feeding Abilities: Able to  feed self;Needs assist;Needs set  up Patient Positioning: Upright in chair Baseline Vocal Quality: Breathy;Hoarse;Low vocal intensity Volitional Cough: Strong Volitional Swallow: Able to elicit Pharyngeal Secretions: Not observed secondary MBS    Reason for Referral Objectively evaluate swallowing function   Oral Phase Oral Preparation/Oral Phase Oral Phase: Impaired Oral - Nectar Oral - Nectar Teaspoon: Weak lingual manipulation;Lingual  pumping;Incomplete tongue to palate contact;Holding of  bolus;Reduced posterior propulsion;Lingual/palatal  residue;Piecemeal swallowing;Delayed oral transit Oral - Nectar Cup: Weak lingual manipulation;Lingual  pumping;Incomplete tongue to palate contact;Reduced posterior  propulsion;Holding of bolus;Lingual/palatal residue;Piecemeal  swallowing Oral - Nectar Straw: Weak lingual manipulation;Lingual  pumping;Incomplete tongue to palate contact;Reduced posterior  propulsion;Holding of bolus;Piecemeal swallowing;Lingual/palatal  residue;Delayed oral transit Oral - Thin Oral - Thin Teaspoon: Weak lingual manipulation;Lingual  pumping;Incomplete tongue to palate contact;Impaired  mastication;Lingual/palatal residue;Piecemeal swallowing;Holding  of bolus;Reduced posterior propulsion;Delayed oral transit Oral - Thin Cup: Weak lingual manipulation;Lingual  pumping;Incomplete tongue to palate contact;Reduced posterior  propulsion;Holding of bolus;Lingual/palatal residue;Piecemeal  swallowing;Delayed oral transit Oral - Thin Straw: Weak lingual manipulation;Lingual  pumping;Incomplete tongue to palate contact;Reduced posterior  propulsion;Holding of bolus;Piecemeal swallowing;Lingual/palatal  residue Oral - Solids Oral - Puree: Weak lingual manipulation;Lingual pumping;Holding  of bolus;Reduced posterior propulsion;Lingual/palatal  residue;Piecemeal swallowing;Delayed oral transit Oral - Mechanical Soft: Impaired mastication;Weak lingual  manipulation;Lingual pumping;Incomplete tongue to  palate  contact;Piecemeal swallowing;Lingual/palatal residue;Delayed oral  transit   Pharyngeal Phase Pharyngeal  Phase Pharyngeal Phase: Impaired Pharyngeal - Nectar Pharyngeal - Nectar Teaspoon: Premature spillage to  valleculae;Delayed swallow initiation;Premature spillage to  pyriform sinuses;Reduced pharyngeal peristalsis;Reduced anterior  laryngeal mobility;Reduced laryngeal elevation;Reduced  airway/laryngeal closure;Reduced tongue base  retraction;Penetration/Aspiration during  swallow;Penetration/Aspiration after swallow;Pharyngeal residue -  pyriform sinuses;Pharyngeal residue - valleculae;Pharyngeal  residue - cp segment Penetration/Aspiration details (nectar teaspoon): Material enters  airway, CONTACTS cords then ejected out Pharyngeal - Nectar Cup: Premature spillage to  valleculae;Premature spillage to pyriform sinuses;Delayed swallow  initiation;Reduced pharyngeal peristalsis;Reduced anterior  laryngeal mobility;Reduced laryngeal elevation;Reduced  airway/laryngeal closure;Reduced tongue base  retraction;Pharyngeal residue - pyriform sinuses;Pharyngeal  residue - valleculae;Pharyngeal residue - cp segment Pharyngeal - Thin Pharyngeal - Thin Teaspoon: Delayed swallow initiation;Premature  spillage to valleculae;Premature spillage to pyriform  sinuses;Reduced pharyngeal peristalsis;Reduced anterior laryngeal  mobility;Reduced tongue base retraction;Reduced airway/laryngeal  closure;Penetration/Aspiration after swallow;Pharyngeal residue -  pyriform sinuses;Pharyngeal residue - valleculae;Pharyngeal  residue - cp segment Penetration/Aspiration details (thin teaspoon): Material enters  airway, passes BELOW cords and not ejected out despite cough  attempt by patient Pharyngeal - Thin Cup: Premature spillage to pyriform  sinuses;Delayed swallow initiation;Reduced pharyngeal  peristalsis;Reduced anterior laryngeal mobility;Reduced laryngeal  elevation;Reduced airway/laryngeal closure;Reduced tongue base   retraction;Penetration/Aspiration after swallow;Trace  aspiration;Pharyngeal residue - valleculae;Pharyngeal residue -  pyriform sinuses;Pharyngeal residue - posterior  pharnyx;Pharyngeal residue - cp segment Penetration/Aspiration details (thin cup): Material enters  airway, passes BELOW cords and not ejected out despite cough  attempt by patient Pharyngeal - Thin Straw: Premature spillage to pyriform  sinuses;Reduced pharyngeal peristalsis;Reduced anterior laryngeal  mobility;Reduced laryngeal elevation;Reduced airway/laryngeal  closure;Reduced tongue base retraction;Penetration/Aspiration  after swallow;Pharyngeal residue - pyriform sinuses;Pharyngeal  residue - posterior pharnyx;Pharyngeal residue - cp  segment;Pharyngeal residue - valleculae Penetration/Aspiration details (thin straw): Material enters  airway, passes BELOW cords and not ejected out despite cough  attempt by patient Pharyngeal - Solids Pharyngeal - Puree: Premature spillage to valleculae;Reduced  pharyngeal peristalsis;Reduced anterior laryngeal  mobility;Reduced tongue base retraction;Reduced airway/laryngeal  closure;Reduced laryngeal elevation;Pharyngeal residue - pyriform  sinuses;Pharyngeal residue - posterior pharnyx;Pharyngeal residue  - valleculae;Pharyngeal residue - cp segment Pharyngeal - Mechanical Soft: Premature spillage to  valleculae;Reduced pharyngeal peristalsis;Reduced anterior  laryngeal mobility;Reduced laryngeal elevation;Reduced  airway/laryngeal closure;Reduced tongue base  retraction;Penetration/Aspiration after swallow;Pharyngeal  residue - posterior pharnyx;Pharyngeal residue - cp  segment;Pharyngeal residue - pyriform sinuses;Pharyngeal residue  - valleculae Penetration/Aspiration details (mechanical soft): Material enters  airway, passes BELOW cords then ejected out  Cervical Esophageal Phase    GO    Cervical Esophageal Phase Cervical Esophageal Phase: Impaired Cervical Esophageal Phase - Nectar Nectar Cup: Reduced  cricopharyngeal relaxation;Prominent  cricopharyngeal segment;Esophageal backflow into cervical  esophagus Nectar Straw: Prominent cricopharyngeal segment;Esophageal  backflow into cervical esophagus Cervical Esophageal Phase - Thin Thin Cup: Reduced cricopharyngeal relaxation;Prominent  cricopharyngeal segment;Esophageal backflow into cervical  esophagus;Esophageal backflow into the pharynx Cervical Esophageal Phase - Solids Puree: Prominent cricopharyngeal segment;Esophageal backflow into  cervical esophagus;Reduced cricopharyngeal relaxation Mechanical Soft: Reduced cricopharyngeal relaxation;Prominent  cricopharyngeal segment        Moreen Fowler MS, CCC-SLP [phone] [facility] 11/27/2012, 5:25 PM      CXR: Pending.  ASSESSMENT / PLAN:  PULMONARY A: VDRF due to ?aspiration PNA resulting in hypercarbic respiratory failure. P:   - Intubate. - Mechanically ventilate. - See ID section. - F/U ABG and CXR.  CARDIOVASCULAR A: Hypotension post intubation. P:  - Volume resuscitation. - F/U CVP. - Hold all antiHTN. - Levophed if needed.  RENAL A:  Worsening Cr, appears pre-renal. P:   - Volume resuscitation. -  Replace K. - BMET in AM.  GASTROINTESTINAL A:  No active issues. P:   - OGT. - Consult nutrition for TF.  HEMATOLOGIC A:  No active issues. P:  - Daily CBC.  INFECTIOUS A:  ?Asp PNA. P:   - Pan culture. - Vanc and zosyn. - PCT.  ENDOCRINE A:  DM by history.   P:   - ISS. - Cortisol level now, if remains hypotensive will need stress dose steroids.  NEUROLOGIC A:  Obtunded due to hypercarbia and likely sepsis in a patient with severe baseline dementia. P:   - Intermittent sedation protocol.  TODAY'S SUMMARY: Spoke with wife, would like [place] to intubate temporarily but not trach/peg, ok with LCB with no CPR and no cardioversion.  All questions answered after extensive discussions.  I have personally obtained a history, examined the patient, evaluated  laboratory and imaging results, formulated the assessment and plan and placed orders.  CRITICAL CARE: The patient is critically ill with multiple organ systems failure and requires high complexity decision making for assessment and support, frequent evaluation and titration of therapies, application of advanced monitoring technologies and extensive interpretation of multiple databases. Critical Care Time devoted to patient care services described in this note is 90 minutes.    Pulmonary and Critical Care Medicine [facility] Pager: [phone]  11/28/2012, 9:04 AM

## 2012-11-28 NOTE — Progress Notes (Signed)
Subjective:  This AM, had change in mental status & Hypercapneic-hypoxic Resp failure--> inutbated.   Hypotensive post intubation & now hypertensive D/W Dr. Molli Knock who feels that this was primarily respiratory with aspiration as the leading thought.  This makes complete sense based upon the patient's extreme weakness & baseline dementia.    Objective:  Vital Signs in the last 24 hours: Temp:  [97.8 F (36.6 C)-99.5 F (37.5 C)] 98.1 F (36.7 C) (08/24 1200) Pulse Rate:  [85-111] 88 (08/24 1100) Resp:  [14-24] 18 (08/24 1100) BP: (44-164)/(20-93) 164/93 mmHg (08/24 1030) SpO2:  [93 %-100 %] 100 % (08/24 1100) Arterial Line BP: (144-182)/(79-109) 144/79 mmHg (08/24 1100) FiO2 (%):  [30 %-100 %] 30 % (08/24 1158) Weight:  [210 lb 8 oz (95.482 kg)] 210 lb 8 oz (95.482 kg) (08/24 0341)  Intake/Output from previous day: 08/23 0701 - 08/24 0700 In: 200 [P.O.:200] Out: 825 [Urine:825] Intake/Output from this shift: Total I/O In: 436.5 [I.V.:161.5; IV Piggyback:275] Out: -  Physical Exam: General appearance: Intubated & sedated, twitching. Neck: IJ Line in place: CVP~ 8 mmHg Lungs: mild bibasal rales.  Non-labored. Heart: regular rate and rhythm, 2/6 SEM c-d @ RUSB. No R/G Abdomen: soft, non-tender; bowel sounds normal; no masses,  no organomegaly and obese Extremities: extremities normal, atraumatic, no cyanosis or edema Pulses: 2+ and symmetric  Lab Results:  Recent Labs  11/26/12 0014 11/28/12 0506  WBC 17.0* 12.8*  HGB 11.4* 13.0  PLT 474* 453*    Recent Labs  11/28/12 0506 11/28/12 0931  NA 140 142  K 3.3* 4.1  CL 90* 91*  CO2 43* 43*  GLUCOSE 131* 205*  BUN 41* 43*  CREATININE 1.45* 1.46*    Recent Labs  11/26/12 0810 11/26/12 1349  TROPONINI <0.30 <0.30   Imaging: None today  Cardiac Studies: Echo Pending -  But question utility at this point  Assessment/Plan:  Principal Problem:   Acute respiratory failure with hypoxia Active Problems:    DIABETES MELLITUS, TYPE II, ON INSULIN   HYPERTENSION   Atrial fibrillation, permanent   ESOPHAGEAL STRICTURE   GERD   Coronary artery disease, last cath 2008 occluded nondom, LCX, wth moderate ostial RCA disease, normal LV function   Diastolic CHF   Acute renal failure   Hypokalemia   Acute respiratory failure   Aspiration pneumonia   Septic shock   Altered mental status  By far & away, his primary issue now revolves around his respiratory and mental status.  There is nothing to suggest a Cardiac etiology.  I completely agree that an aspiration event is the cause.   I am just not sure of his ability to have a meaningful recovery.  At best, he will surely require SNF placement on d/c, but perhaps Palliative Care consultation is the best option.    The family seems committed to "watching" to see how he does over the next 24-48 hours.  After that point, I think that they may not want to pursue longer aggressive therapy.    I spent at least 15 minutes beyond my initial assessment & exam in counseling with the wife & family re: his condition and prognosis & the concept of "being realistic" about his low likihood of having a truly meaningful recovery.  Behind all of this is the Chronic Cancer Spectre as well as his baseline dementia & debility with progressively worsening quality of life.  The wife was obviously tearful, but acknowledged these factors.  She simply was "not ready to  let go yet".  Brisk diuresis & Cr increasing a bit -- I have stopped IV Lasix as CVP & CXR would suggest adequate diuresis. Afib rate controlled, INR therapeutic -- monitor closely while on warfarin.  I will notify Dr. Allyson Sabal (who is the patient's primary cardiologist) of these events & he may want to come by for moral support.  Otherwise, I do not see any pressing reason for [place] to follow on a daily basis.  We will, of course be available as needed, but will sign off for now.   LOS: 3 days    Perris Tripathi  W 11/28/2012, 1:12 PM

## 2012-11-28 NOTE — Progress Notes (Addendum)
Patient ID: Frederick [name], male   DOB: 31-Dec-1929, 79 y.o.   MRN: [id number] [facility] PROGRESS NOTE  Azarius Lambson YQM:[id number] DOB: 28-Nov-1929 DOA: 11/25/2012 PCP: Georgianne Fick, MD  Brief narrative:  Py is 79 yo male with a history of diabetes mellitus on insulin, hypertension, coronary artery disease, Permanent A. fib and history of PE on chronic Coumadin, GERD, history of peripheral vascular disease, history of pancreatic tumor status post resection and recent transurethral bladder tumor resection and left ureteral stent placed by Dr Margarita Grizzle on 11/03/2012 and discharged home with a foley.earlier in August. He now presented to [facility] ED with main concern of progressively worsening shortness of breath initially noted with exertion and has progressed to dyspnea and rest, 2 pillow orthopnea and slightly worsening LE edema. In ED, CXR consistent with pulmonary vascular congestion. [facility] asked to admit for further evaluation and management of ? Diastolic CHF exacerbation.   Events: 11/28/2012 --> more lethargy and acute respiratory hypoxic failure   Principal Problem:    Acute respiratory failure with hypoxia, hypercarbic - Unclear etiology, likely multifactorial secondary to pulmonary vascular congestion, questionable aspiration PNA - ABG --> pH 7.36, pCO2 80, pO2 63, HCO3 44 - CXR pending - plan on transferring to intensive care unit for further management  Active Problems:   Diastolic CHF - Appears that patient diuresing well, weight trend: 216 pounds on admission --> 213 --> 210 lbs this AM - Currently on Lasix 40 mg twice a day IV, appreciate cardiology assistance - May need to lower dose of Lasix given acute renal failure, worsening creatinine   Acute renal failure - This is likely multifactorial secondary to prerenal etiology, Lasix use and poor oral intake  - Plan on a lowering dose of Lasix if cardiology agrees  - BMP in the morning    DIABETES MELLITUS, TYPE II, ON  INSULIN - Change sliding scale insulin coverage to sensitive    HYPERTENSION - Reasonable inpatient control   Atrial fibrillation, permanent - INR therapeutic, Coumadin per pharmacy    Hypokalemia - Secondary to Lasix, will supplement via IV route as patient unable to take by mouth   ESOPHAGEAL STRICTURE - Patient n.p.o. for now, will likely need GI evaluation based on modified barium swallow study  - Provide patient has been having progressive difficulty with swallowing over the past few months and has required esophageal dilation in the past - Will defer on this for now as patient is too lethargic to cooperate    GERD - Change Protonix to IV    Coronary artery disease, last cath 2008 occluded nondom, LCX, wth moderate ostial RCA disease, normal LV function   UTI, Pseudomonas  - on last admission  - cultures showed pan sensitivity, pt has completed therapy with Ciprofloxacin for 10 days  Consultants:  PCCM Cardiology  Procedures/Studies:  Dg Chest 2 View 11/25/2012 Cardiac enlargement with developing pulmonary vascular congestion and perihilar edema. Infiltration or atelectasis in the left lung base.  Antibiotics:  None  Code Status: Full  Family Communication: Wife at bedside  Disposition Plan: Transfer to ICU   HPI/Subjective: No events overnight.   Objective: Filed Vitals:   11/27/12 1500 11/27/12 2007 11/28/12 0341 11/28/12 0815  BP: 145/88 143/57 96/46 139/83  Pulse: 111 110 92 88  Temp: 99.5 F (37.5 C) 98.5 F (36.9 C) 97.8 F (36.6 C) 98.3 F (36.8 C)  TempSrc: Oral Oral Oral Oral  Resp: 18 24 24 20   Height:      Weight:  95.482 kg (210 lb 8 oz)   SpO2: 93% 94% 96% 99%    Intake/Output Summary (Last 24 hours) at 11/28/12 0843 Last data filed at 11/28/12 0400  Gross per 24 hour  Intake    200 ml  Output    825 ml  Net   -625 ml    Exam:   General:  Pt is lethargic and only opens eyes with sternal rub, non verbal  Cardiovascular: Irregular rate  and rhythm, SEM 2/6, no rubs, no gallops  Respiratory: Diminished breath sounds bilaterally with bibasilar crackles   Abdomen: Soft, non tender, non distended, bowel sounds present, no guarding  Extremities: No edema, pulses DP and PT palpable bilaterally  Data Reviewed: Basic Metabolic Panel:  Recent Labs Lab 11/26/12 0014 11/26/12 0218 11/27/12 0538 11/28/12 0506  NA 142  --  142 140  K 3.7  --  3.1* 3.3*  CL 95*  --  90* 90*  CO2 42*  --  44* 43*  GLUCOSE 71  --  92 131*  BUN 18  --  23 41*  CREATININE 1.06  --  1.25 1.45*  CALCIUM 9.7  --  10.0 9.8  MG  --  1.9 1.7 1.9   CBC:  Recent Labs Lab 11/26/12 0014 11/28/12 0506  WBC 17.0* 12.8*  NEUTROABS 13.7*  --   HGB 11.4* 13.0  HCT 38.7* 41.6  MCV 100.8* 96.5  PLT 474* 453*   Cardiac Enzymes:  Recent Labs Lab 11/26/12 0218 11/26/12 0810 11/26/12 1349  TROPONINI <0.30 <0.30 <0.30  CBG:  Recent Labs Lab 11/27/12 1839 11/27/12 2011 11/27/12 2340 11/28/12 0347 11/28/12 0742  GLUCAP 165* 286* 291* 103* 185*    Recent Results (from the past 240 hour(s))  MRSA PCR SCREENING     Status: Abnormal   Collection Time    11/26/12  4:51 AM      Result Value Range Status   MRSA by PCR POSITIVE (*) NEGATIVE Final   Comment:            The GeneXpert MRSA Assay (FDA     approved for NASAL specimens     only), is one component of a     comprehensive MRSA colonization     surveillance program. It is not     intended to diagnose MRSA     infection nor to guide or     monitor treatment for     MRSA infections.     RESULT CALLED TO, READ BACK BY AND VERIFIED WITH:     A. MOORE RN AT 1610 ON 08.22.14 BY SHUEA     Scheduled Meds: . chlordiazePOXIDE  10 mg Oral TID  . DULoxetine  60 mg Oral QHS  . furosemide  40 mg Intravenous BID  . insulin aspart  0-20 Units Subcutaneous Q4H  . irbesartan  150 mg Oral Q breakfast  . isosorbide mononitrate  60 mg Oral Q supper  . ketorolac  30 mg Intravenous Once  .  nutrition supplement  1 packet Oral BID BM  . pantoprazole  IV  40 mg Intravenous QHS  . potassium chloride SA  20 mEq Oral Daily  . potassium chloride  40 mEq Oral Once  . senna-docusate  1 tablet Oral BID  . Warfarin - Pharmacist    Does not apply q1800   Continuous Infusions:   Debbora Presto, MD  [facility] Pager [phone]  If 7PM-7AM, please contact night-coverage [email or web address] Password [facility] 11/28/2012, 8:43 AM   LOS:  3 days

## 2012-11-29 ENCOUNTER — Inpatient Hospital Stay (HOSPITAL_COMMUNITY): Payer: Medicare Other

## 2012-11-29 LAB — BLOOD GAS, ARTERIAL
Acid-Base Excess: 9.2 mmol/L — ABNORMAL HIGH (ref 0.0–2.0)
Bicarbonate: 33.9 mEq/L — ABNORMAL HIGH (ref 20.0–24.0)
Drawn by: 11249
Drawn by: 11249
FIO2: 0.3 %
MECHVT: 530 mL
PEEP: 5 cmH2O
Patient temperature: 37
Patient temperature: 37
RATE: 14 resp/min
RATE: 14 resp/min
pCO2 arterial: 31.7 mmHg — ABNORMAL LOW (ref 35.0–45.0)
pH, Arterial: 7.539 — ABNORMAL HIGH (ref 7.350–7.450)
pO2, Arterial: 95.3 mmHg (ref 80.0–100.0)

## 2012-11-29 LAB — BASIC METABOLIC PANEL
BUN: 41 mg/dL — ABNORMAL HIGH (ref 6–23)
Calcium: 8.9 mg/dL (ref 8.4–10.5)
GFR calc non Af Amer: 45 mL/min — ABNORMAL LOW (ref >90)
Glucose, Bld: 192 mg/dL — ABNORMAL HIGH (ref 70–99)

## 2012-11-29 LAB — URINE CULTURE: Culture: NO GROWTH

## 2012-11-29 LAB — CBC
HCT: 36.2 % — ABNORMAL LOW (ref 39.0–52.0)
MCHC: 31.2 g/dL (ref 30.0–36.0)
MCV: 94.5 fL (ref 78.0–100.0)
RDW: 14.5 % (ref 11.5–15.5)
WBC: 13.6 10*3/uL — ABNORMAL HIGH (ref 4.0–10.5)

## 2012-11-29 LAB — GLUCOSE, CAPILLARY
Glucose-Capillary: 164 mg/dL — ABNORMAL HIGH (ref 70–99)
Glucose-Capillary: 172 mg/dL — ABNORMAL HIGH (ref 70–99)
Glucose-Capillary: 211 mg/dL — ABNORMAL HIGH (ref 70–99)
Glucose-Capillary: 215 mg/dL — ABNORMAL HIGH (ref 70–99)

## 2012-11-29 MED ORDER — PRO-STAT SUGAR FREE PO LIQD
30.0000 mL | ORAL | Status: DC
  Administered 2012-11-30: 30 mL
  Filled 2012-11-29 (×3): qty 30

## 2012-11-29 MED ORDER — FREE WATER
150.0000 mL | Freq: Four times a day (QID) | Status: DC
  Administered 2012-11-29 – 2012-12-02 (×11): 150 mL

## 2012-11-29 MED ORDER — POTASSIUM PHOSPHATE DIBASIC 3 MMOLE/ML IV SOLN: 20.0000 meq | Freq: Once | INTRAVENOUS | Status: DC

## 2012-11-29 MED ORDER — ISOSORBIDE DINITRATE 10 MG PO TABS
15.0000 mg | ORAL_TABLET | Freq: Two times a day (BID) | ORAL | Status: DC
  Administered 2012-11-29 – 2012-12-07 (×17): 15 mg
  Filled 2012-11-29 (×18): qty 1

## 2012-11-29 MED ORDER — POTASSIUM PHOSPHATE DIBASIC 3 MMOLE/ML IV SOLN
20.0000 mmol | Freq: Once | INTRAVENOUS | Status: AC
  Administered 2012-11-29: 20 mmol via INTRAVENOUS
  Filled 2012-11-29: qty 6.67

## 2012-11-29 MED ORDER — WARFARIN SODIUM 4 MG PO TABS
4.0000 mg | ORAL_TABLET | Freq: Once | ORAL | Status: AC
  Administered 2012-11-29: 4 mg
  Filled 2012-11-29: qty 1

## 2012-11-29 MED ORDER — POTASSIUM CHLORIDE 20 MEQ/15ML (10%) PO LIQD
20.0000 meq | ORAL | Status: AC
  Administered 2012-11-29 (×2): 20 meq
  Filled 2012-11-29 (×3): qty 15

## 2012-11-29 MED ORDER — PRO-STAT SUGAR FREE PO LIQD
60.0000 mL | Freq: Three times a day (TID) | ORAL | Status: DC
  Filled 2012-11-29 (×2): qty 60

## 2012-11-29 MED ORDER — METOCLOPRAMIDE HCL 5 MG/ML IJ SOLN
5.0000 mg | Freq: Three times a day (TID) | INTRAMUSCULAR | Status: AC
  Administered 2012-11-29 – 2012-11-30 (×3): 5 mg via INTRAVENOUS
  Filled 2012-11-29 (×3): qty 2

## 2012-11-29 MED ORDER — SODIUM CHLORIDE 0.9 % IJ SOLN: 10.0000 mL | Freq: Two times a day (BID) | INTRAMUSCULAR | Status: DC

## 2012-11-29 MED ORDER — VITAL AF 1.2 CAL PO LIQD
1000.0000 mL | ORAL | Status: DC
  Administered 2012-11-29 – 2012-12-01 (×3): 1000 mL
  Filled 2012-11-29 (×4): qty 1000

## 2012-11-29 MED ORDER — ADULT MULTIVITAMIN LIQUID CH
5.0000 mL | Freq: Every day | ORAL | Status: DC
  Administered 2012-11-29 – 2012-12-07 (×9): 5 mL
  Filled 2012-11-29 (×9): qty 5

## 2012-11-29 MED ORDER — PRO-STAT SUGAR FREE PO LIQD
60.0000 mL | Freq: Three times a day (TID) | ORAL | Status: DC
  Administered 2012-11-29 – 2012-12-02 (×9): 60 mL
  Filled 2012-11-29 (×11): qty 60

## 2012-11-29 MED ORDER — PRO-STAT SUGAR FREE PO LIQD: 30.0000 mL | ORAL | Status: DC

## 2012-11-29 MED ORDER — SENNOSIDES-DOCUSATE SODIUM 8.6-50 MG PO TABS: 2.0000 | ORAL_TABLET | Freq: Once | ORAL | Status: DC

## 2012-11-29 MED ORDER — SODIUM CHLORIDE 0.9 % IJ SOLN: 10.0000 mL | INTRAMUSCULAR | Status: DC | PRN

## 2012-11-29 NOTE — Progress Notes (Signed)
[facility] ADULT ICU REPLACEMENT PROTOCOL FOR AM LAB REPLACEMENT ONLY  The patient does apply for the [facility] Adult ICU Electrolyte Replacment Protocol based on the criteria listed below:   1. Is GFR >/= 40 ml/min? yes  Patient's GFR today is 45 2. Is urine output >/= 0.5 ml/kg/hr for the last 6 hours? yes Patient's UOP is 0.7 ml/kg/hr 3. Is BUN < 60 mg/dL? yes  Patient's BUN today is 41 4. Abnormal electrolyte(s):Potassium 5. Ordered repletion with: Potassium per Protocol  Frederick [name] P 11/29/2012 5:26 AM

## 2012-11-29 NOTE — Progress Notes (Signed)
[id number]/Rhonda Davis, RN, BSN, CCM [phone] Chart Reviewed for discharge and hospital needs. Discharge needs at time of review:  None Review of patient progress due on [id number]. 

## 2012-11-29 NOTE — Progress Notes (Signed)
ANTICOAGULATION CONSULT NOTE - Follow Up Consult  Pharmacy Consult for warfarin Indication: h/o pulmonary embolism, afib  Allergies  Allergen Reactions  . Hydromorphone Hcl Other (See Comments)    Dilaudid caused Confusion   . Morphine Other (See Comments)    confusion  . Pregabalin Other (See Comments)    lyrica - caused hallucinations    Labs:  Recent Labs  11/26/12 1349  11/27/12 0538 11/28/12 0506 11/28/12 0931 11/29/12 0445  HGB  --   --   --  13.0  --  11.3*  HCT  --   --   --  41.6  --  36.2*  PLT  --   --   --  453*  --  387  LABPROT  --   --  27.3* 27.1*  --  21.4*  INR  --   --  2.64* 2.62*  --  1.92*  CREATININE  --   < > 1.25 1.45* 1.46* 1.41*  TROPONINI <0.30  --   --   --   --   --   < > = values in this interval not displayed.  Assessment: 79 y/o M recently discharged from [facility] 8/16 for sepsis secondary to Pseudomonas UTI, presented 8/21 with progressive SOB, productive cough. Patient with h/o afib and PE, on chronic warfarin. Patient was reportedly taking warfarin 5mg  po daily with last dose 8/21.  On ventilator since 8/24 with possible aspiration pneumonia, now receiving medications via orogastric tube.  Inpatient warfarin doses administered 8/22 - 8/24:  5mg , 4mg , 4mg .  INR (1.92) down slightly today  H/H and pltc acceptable, no bleeding reported.  DDI with Ciprofloxacin noted - patient was started on Ciprofloxacin 8/16, anticipated to continue until 8/26.   Goal of Therapy:  INR 2-3 Monitor platelets by anticoagulation protocol: Yes   Plan:   Warfarin 4mg  via tube tonight.  Daily PT/INR  Pharmacy will f/u daily.  Elie Goody, PharmD, BCPS Pager: [phone] 11/29/2012  8:37 AM   .

## 2012-11-29 NOTE — Progress Notes (Signed)
NUTRITION FOLLOW UP  Intervention:   Initiate Vital AF 1.2 @ 20 ml/hr via OGT and hold at goal rate of 20 ml/hr. 30 ml Prostat 7 times daily.  At goal rate, tube feeding regimen will provide 1429 kcal, 141 grams of protein, and 389 ml of H2O.  Provide 150 ml free water flushes q 6 hours. Provide Liquid Multivitamin per tube Discontinue Juven BID until extubated  Nutrition Dx:   Inadequate oral intake related to swallowing difficulty as evidenced by pt's wife report of poor PO intake for 3 days and 3% wt loss in less than one month; discontinued  New Nutrition Dx: Inadequate oral intake related to inability to eat as evidenced by NPO status and intubation.   Goal:   Pt to meet >/= 90% of their estimated nutrition needs; discontinued New Goal: Enteral nutrition to provide 60-70% of estimated calorie needs (22-25 kcals/kg ideal body weight) and 100% of estimated protein needs, based on ASPEN guidelines for permissive underfeeding in critically ill obese individuals.  Monitor:   TF initiation/tolerance Vent status Weight; 2 lb wt loss since admission Labs; low potassium, high CO2, high BUN, low hemoglobin  Assessment:   Pt underwent a modified barium swallowing study on 8/23 and diet was advanced to dysphagia 1 diet with nectar-thick liquids. 8/24 pt became very somnolent and barely arousable, ABG revealed hypercarbic and hypoxic respiratory failure, and pt was intubated. RD consulted for TF initiation and management. Pt has OGT in place.   Height: Ht Readings from Last 1 Encounters:  11/26/12 5\' 8"  (1.727 m)    Weight Status:   Wt Readings from Last 1 Encounters:  11/29/12 214 lb 11.7 oz (97.4 kg)   Patient is currently intubated on ventilator support.  MV: 6.3 L/min Temp:Temp (24hrs), Avg:99.5 F (37.5 C), Min:99 F (37.2 C), Max:100.2 F (37.9 C)  Propofol: 5.8 ml/hr (153 kcal per 24 hours)  Re-estimated needs:  Kcal: 1905 (Goal:1143-1334) Protein: 140-154 grams Fluid:  2.6 L/day  Skin: non-pitting RLE and LLE edema; stage 2 pressure ulcer on sacrum, wound on left leg  Diet Order:     Intake/Output Summary (Last 24 hours) at 11/29/12 1225 Last data filed at 11/29/12 1200  Gross per 24 hour  Intake 1803.34 ml  Output   1495 ml  Net 308.34 ml    Last BM: 8/25   Labs:   Recent Labs Lab 11/27/12 0538 11/28/12 0506 11/28/12 0931 11/29/12 0445  NA 142 140 142 145  K 3.1* 3.3* 4.1 3.3*  CL 90* 90* 91* 100  CO2 44* 43* 43* 35*  BUN 23 41* 43* 41*  CREATININE 1.25 1.45* 1.46* 1.41*  CALCIUM 10.0 9.8 9.7 8.9  MG 1.7 1.9  --  1.8  PHOS  --   --   --  2.0*  GLUCOSE 92 131* 205* 192*    CBG (last 3)   Recent Labs  11/29/12 0435 11/29/12 0737 11/29/12 1133  GLUCAP 164* 150* 172*    Scheduled Meds: . antiseptic oral rinse  15 mL Mouth Rinse QID  . chlordiazePOXIDE  10 mg Oral TID  . chlorhexidine  15 mL Mouth Rinse BID  . DULoxetine  60 mg Oral QHS  . insulin aspart  2-6 Units Subcutaneous Q4H  . irbesartan  150 mg Oral Q breakfast  . isosorbide dinitrate  15 mg Per Tube BID  . metoCLOPramide (REGLAN) injection  5 mg Intravenous Q8H  . nutrition supplement  1 packet Oral BID BM  . pantoprazole (PROTONIX)  IV  40 mg Intravenous QHS  . piperacillin-tazobactam (ZOSYN)  IV  3.375 g Intravenous Q8H  . potassium phosphate IVPB (mmol)  20 mmol Intravenous Once  . senna-docusate  1 tablet Oral BID  . vancomycin  1,250 mg Intravenous Q24H  . warfarin  4 mg Per Tube ONCE-1800  . Warfarin - Pharmacist Dosing Inpatient   Does not apply q1800    Continuous Infusions: . sodium chloride 50 mL/hr at 11/29/12 0651  . propofol 10 mcg/kg/min (11/29/12 1145)    Ian Malkin RD, LDN Inpatient Clinical Dietitian Pager: [phone] After Hours Pager: [phone]

## 2012-11-29 NOTE — Progress Notes (Signed)
RN decreased diprivan and RR increased to the thirties. Placed back on smaller dose of diprivan to attempt wean, Patient failed cpap/ps with increased RR 33- 37 and low VT. Patient unable to do VC. Dr. Delton Coombes felt the patient wasn't ready for wean to extubation but I could try cpap/ps 20/5 again later today.

## 2012-11-29 NOTE — Progress Notes (Signed)
GU  I spoke with the patient's wife and niece today.   We discussed the natural history of his bladder cancer.  I explained that this is a stage II cancer and that the best treatment is surgical excision of the bladder.  I explained that I would not recommend it in a man of his poor health status even before he developed the problems which have been in the hospital currently.  Other options include chemotherapy and radiation which I do not think he feels tolerate.  Another option is surveillance cystoscopies with removal of tumors if they return.  This would not cure the cancer but it was simply be more of a palliative approach to his cancer.  We discussed the risks, benefits, side effects of each of these options.  I explained that I recommended having the Foley catheter changed at least every 4 weeks.  The catheter has not been changed since his surgery on July 30.  I discussed with his nurse today and we will have them remove his catheter and placed an 55 French catheter tomorrow morning.  Orders have been placed for this.  At this point the wife is not sure how she would like to proceed.  We did discuss the possibility of talking to palliative care to get further options.  She will consider this.

## 2012-11-29 NOTE — Progress Notes (Signed)
ABG done. Called results to CCM and changes were made to VT from 530 to 450 based on results of 7.59/ 31/99.ABG to be done in 2 hours.

## 2012-11-29 NOTE — Progress Notes (Signed)
Patient sedated on ventilator.  Patient blood pressure soft about 30 minutes after administration of scheduled isosorbide dinitrate per tube.  MD notified of change.  No new orders given.  Will continue to monitor.  Kinnie Feil, RN

## 2012-11-29 NOTE — Progress Notes (Signed)
eLink Physician-Brief Progress Note Patient Name: Frederick [name] DOB: 18-Dec-1929 MRN: [id number]  Date of Service  11/29/2012   HPI/Events of Note  Patient on vent with resp alkalosis with pH 7.59/31/99/31   eICU Interventions  Plan: Decrease TV to 450 cc Repeat ABG in 2 hours   Intervention Category Major Interventions: Acid-Base disturbance - evaluation and management  Shawny Borkowski 11/29/2012, 4:44 AM

## 2012-11-29 NOTE — Progress Notes (Signed)
PT Cancellation Note  Patient Details Name: Frederick [name] MRN: [id number] DOB: 01/01/1930   Cancelled Treatment:    Reason Eval/Treat Not Completed: Medical issues which prohibited therapy  Note pt is now on ventilator.  Will sign off from PT.  Please reorder when pt medically ready to advance mobility.  Would recommend use of mechanical lift for OOB with nursing.   Ebony Hail [facility] 11/29/2012, 11:48 AM

## 2012-11-29 NOTE — Consult Note (Signed)
PULMONARY  / CRITICAL CARE MEDICINE  Name: Frederick [name] MRN: [id number] DOB: [birth year]    ADMISSION DATE:  11/25/2012 CONSULTATION DATE:  11/28/2012  REFERRING MD :  [facility] PRIMARY SERVICE: TRH-->PCCM CHIEF COMPLAINT:  Respiratory failure  BRIEF PATIENT DESCRIPTION: 79 year old male with history of advance dementia that is essentially non-verbal presenting to the hospital with respiratory failure and fluid overload.  Also significant history of dysphagia and ?aspiration.  On 8/24 the patient had significant AMS, very somnolent and barely arousable.  ABG revealed hypercarbic and hypoxic respiratory failure.  SIGNIFICANT EVENTS / STUDIES:  8/24 VDRF requiring intubation.  LINES / TUBES: ET Tube 8/24>>> OGT 8/24>>>  RIJ TLC 8/24>>> R radial a-line 8/24>>>  CULTURES: Blood 8/24>>> Urine 8/24>>> Sputum 8/24>>>  ANTIBIOTICS: Vanc 8/24>>> Zosyn 8/24>>>   SUBJECTIVE:  RN reports mild agitation on SBT.  Low volumes, tachypnea.    VITAL SIGNS: Temp:  [98.1 F (36.7 C)-100.2 F (37.9 C)] 99 F (37.2 C) (08/25 0400) Pulse Rate:  [85-106] 99 (08/25 0800) Resp:  [12-24] 15 (08/25 0800) BP: (44-164)/(20-93) 96/65 mmHg (08/25 0800) SpO2:  [97 %-100 %] 100 % (08/25 0800) Arterial Line BP: (87-182)/(56-109) 159/79 mmHg (08/25 0800) FiO2 (%):  [30 %-100 %] 30 % (08/25 0800) Weight:  [214 lb 11.7 oz (97.4 kg)] 214 lb 11.7 oz (97.4 kg) (08/25 0500)  HEMODYNAMICS: CVP:  [6 mmHg-11 mmHg] 6 mmHg  VENTILATOR SETTINGS: Vent Mode:  [-] PRVC FiO2 (%):  [30 %-100 %] 30 % Set Rate:  [14 bmp-18 bmp] 14 bmp Vt Set:  [450 mL-530 mL] 450 mL PEEP:  [5 cmH20] 5 cmH20 Plateau Pressure:  [19 cmH20-24 cmH20] 19 cmH20  INTAKE / OUTPUT: Intake/Output     08/24 0701 - 08/25 0700 08/25 0701 - 08/26 0700   P.O.     I.V. (mL/kg) 1550.8 (15.9) 100 (1)   NG/GT 220 40   IV Piggyback 387.5    Total Intake(mL/kg) 2158.3 (22.2) 140 (1.4)   Urine (mL/kg/hr) 1500 (0.6) 50 (0.2)   Total Output 1500 50    Net +658.3 +90          PHYSICAL EXAMINATION: General:  Chronically ill appearing elderly male, NAD. Neuro:  Sedated and intubated, awake, nods, moving ext's HEENT:  [place]/AT, PERRL, EOM-I and bloody mucous membranes. Cardiovascular:  RRR, Nl S1/S2, -M/R/G. Lungs:  Coarse BS diffusely L>R. Abdomen:  Soft, NT, ND and +BS. Musculoskeletal:  -edema and -tenderness. Skin:  Intact.  LABS:  CBC Recent Labs     11/28/12  0506  11/29/12  0445  WBC  12.8*  13.6*  HGB  13.0  11.3*  HCT  41.6  36.2*  PLT  453*  387   Coag's Recent Labs     11/27/12  0538  11/28/12  0506  11/29/12  0445  INR  2.64*  2.62*  1.92*   BMET Recent Labs     11/28/12  0506  11/28/12  0931  11/29/12  0445  NA  140  142  145  K  3.3*  4.1  3.3*  CL  90*  91*  100  CO2  43*  43*  35*  BUN  41*  43*  41*  CREATININE  1.45*  1.46*  1.41*  GLUCOSE  131*  205*  192*   Electrolytes Recent Labs     11/27/12  0538  11/28/12  0506  11/28/12  0931  11/29/12  0445  CALCIUM  10.0  9.8  9.7  8.9  MG  1.7  1.9   --   1.8  PHOS   --    --    --   2.0*   Sepsis Markers Recent Labs     11/28/12  0931  11/29/12  0445  PROCALCITON  <0.10  <0.10   ABG Recent Labs     11/28/12  1145  11/29/12  0430  11/29/12  0627  PHART  7.582*  7.598*  7.539*  PCO2ART  41.0  31.7*  39.9  PO2ART  428.0*  99.0  95.3   Liver Enzymes No results found for this basename: AST, ALT, ALKPHOS, BILITOT, ALBUMIN,  in the last 72 hours Cardiac Enzymes Recent Labs     11/26/12  1349  11/27/12  0538  TROPONINI  <0.30   --   PROBNP   --   1393.0*   Glucose Recent Labs     11/28/12  1158  11/28/12  1537  11/28/12  1936  11/29/12  0040  11/29/12  0435  11/29/12  0737  GLUCAP  170*  158*  126*  154*  164*  150*    Recent Labs Lab 11/28/12 0931 11/29/12 0445  PROCALCITON <0.10 <0.10     Imaging Dg Chest Port 1 View  11/29/2012   *RADIOLOGY REPORT*  Clinical Data: Respiratory failure.  PORTABLE CHEST - 1  VIEW  Comparison: 11/28/2012  Findings: Endotracheal tube remains with the tip approximately 2 cm above the carina.  Central line and nasogastric tube positioning are stable.  Lungs show relatively stable left lower lobe atelectasis.  No pulmonary edema or pleural effusions are identified.  Heart size is stable.  IMPRESSION: Stable left lower lobe atelectasis.   Original Report Authenticated By: Irish Lack, M.D.   Dg Chest Port 1 View  11/28/2012   *RADIOLOGY REPORT*  Clinical Data: Respiratory distress.  Intubation, central venous catheter placement and nasogastric tube placement.  PORTABLE CHEST - 1 VIEW 11/28/2012 1029 hours:  Comparison: Portable chest x-ray earlier same day 0851 hours.  Findings: Endotracheal tube tip projects approximately 2 cm above carina.  Right jugular central venous catheter tip projects over the lower SVC.  No evidence of pneumothorax mediastinal hematoma. Nasogastric tube tip in the fundus the stomach.  Cardiac silhouette enlarged but stable.  Mild pulmonary venous hypertension, improved since earlier in the morning, without overt edema currently. Slight improved aeration in the left lower lobe, though moderate consolidation persist.  No new pulmonary parenchymal abnormalities.  IMPRESSION:  1.  Endotracheal tube tip approximately 2 cm above the carina. 2.  Right jugular central venous catheter tip in the lower SVC.  No acute complicating features. 3.  Nasogastric tube tip in the gastric fundus. 4.  Interval resolution of interstitial pulmonary edema since earlier same date.  Improved aeration in the left lower lobe, though moderate atelectasis and/or pneumonia persists.  No new abnormalities.   Original Report Authenticated By: Hulan Saas, M.D.   Dg Chest Port 1v Same Day  11/28/2012   *RADIOLOGY REPORT*  Clinical Data: Lethargy.  Respiratory distress.  PORTABLE CHEST - 1 VIEW SAME DAY  Comparison: 11/25/2012  Findings: The study is somewhat limited by respiratory  motion, low lung volumes and a semi-erect rotated positioning.  Left lung base opacity noted previously is stable most likely atelectasis although infiltrate is possible.  There is some central vascular congestion. Mild right perihilar airspace opacity is suggested which may reflect asymmetric edema.  Cardiac silhouette is mildly enlarged.  No pneumothorax is  appreciated.  IMPRESSION: Allowing for differences in patient positioning and technique, findings are similar to the prior exam.  There may be mild pulmonary edema although this appearance is accentuated by low lung volumes and motion artifact.  Left lung base opacities most likely atelectasis.   Original Report Authenticated By: Amie Portland, M.D.   Dg Swallowing Func-speech Pathology  11/27/2012   Lacinda Axon, CCC-SLP     11/27/2012  6:07 PM Objective Swallowing Evaluation: Modified Barium Swallowing Study   Patient Details  Name: Auburn Hert MRN: [id number] Date of Birth: 03/19/1930  Today's Date: 11/27/2012 Time: 1700-1730 SLP Time Calculation (min): 30 min  Past Medical History:  Past Medical History  Diagnosis Date  . Diabetes mellitus   . Hypertension   . Hypercholesterolemia   . Esophageal stricture   . Pulmonary embolism 2008    chronic coumadin   . Coronary artery disease     DR. BERRY IS PT'S CARDIOLOGIST  . Atrial fibrillation     CHRONIC COUMADIN-pemanent atrial fib  . Anxiety     SINCE 1975   . Depression     SINCE 1975    . Shortness of breath     NOT SURE WHAT CAUSES SOB - BUT HE IS NOT ABLE TO BE ACTIVE -  AND IS SOB WITH ANY ACTIVITY - USES OXYGEN 2 L NASAL CANNULA -  ALL THE TIME -EXCEPT WHEN IN SHOWER OR CHANGING CLOTHES on home  02  . Peripheral vascular disease     TOLD SOME SMALL AMOUNT OF BLOCKAGE IN LEG WHEN HEART CATH DONE  2008  . Bladder tumor     HAS HAD HEMATURIA OFF AND ON - HAS FOLEY CATH THAT WAS PLACED  JUNE 20TH, 2014  . GERD (gastroesophageal reflux disease)   . Edema     FEET AND LEGS MOST DAYS - SOMETIMES WEARS  COMPRESSION HOSE  . Cancer     SKIN CANCERS  . Arthritis     S/P BILATERAL TOTAL KNEE REPLACEMENTS - BUT BOTH KNEES PAINFUL  AND JOINTS WORN OUT; BAD RIGHT HIP - BUT NOT A CANDIDATE FOR HIP  PREPLACMENT;  HAS SEVERE LOWER  BACK AND LEG PAIN - HAS SPINAL  STENOSIS AND BULGING DISC; CURVATURE OF UPPER SPINE - UNABLE TO  LAY HIS HEAD FLAT.  Marland Kitchen Pneumonia 2005  . Difficult intubation     DUE TO LIMITED NECK FLEXION AND SHORT NECK--ANESTHESIA RECORD  FROM 2007 VATS SURGERY AT [facility] OBTAINED AND ON PT'S CHART.  Marland Kitchen Problems with hearing     WEARS BILATERAL HEARING AIDS   Past Surgical History:  Past Surgical History  Procedure Laterality Date  . Shoulder surgery      bilateral  . Pancreas surgery  2001    TUMOR REMOVED FROM PANCREAS AND SPLEEN ALSO REMOVED  . Total knee arthroplasty      bilateral  . Lung surgery  2007    non-cancer  VIDEO ASSISTED THORCOTOMY TO REMOVE FLUID /  DECORTICATION. DR. Edwyna Shell  . Basil cell    . Cardiac catheterization  2008    totally occluded nondominant LCX wth mod. ostial RCA disease  and nl. EF  . Esophageal stretch    . Skin cancer removed from left side of head - required skin  graft from left leg    . Cartilage repair 198- left knee    . Joint replacement    . Transurethral resection of bladder tumor with gyrus  (turbt-gyrus) N/A 11/03/2012    Procedure:  TRANSURETHRAL RESECTION OF BLADDER TUMOR WITH GYRUS  (TURBT-GYRUS);  Surgeon: Milford Cage, MD;  Location: [facility]  ORS;  Service: Urology;  Laterality: N/A;  . Cystoscopy/retrograde/ureteroscopy  11/03/2012    Procedure: CYSTOSCOPY/RETROGRADE/ LEFT URETEROSCOPY AND STENT  PLACEMENT;  Surgeon: Milford Cage, MD;  Location: [facility]  [facility];  Service: Urology;;  . Theador Hawthorne N/A 11/03/2012    Procedure: CYSTOGRAM;  Surgeon: Milford Cage, MD;   Location: [facility] OR[facility];  Service: Urology;  Laterality: N/A;   HPI:  Gershom Brobeck is a 79 y.o. male who was just discharged from  our service 6 days ago after an admission for a UTI with   pseudomonas.  Since discharge he has getting progressively short  of breath until today he developed cough, productive of a whitish  sputum.  He has had no fever, no chills, no sweating since Monday  of this week.  In the ED while his WBC is elevated at 17k this is  significantly down from the >30k it was at discharge 6 days ago.   At the time of discharge he had been taken off of his Zaroxylyn  and had not yet restarted this, he did restart his lasix on  Tuesday.  MBS indicated following results of BSE completed on  11/25/12.       Assessment / Plan / Recommendation Clinical Impression  Dysphagia Diagnosis: Moderate oral phase dysphagia;Severe  pharyngeal phase dysphagia;Severe cervical esophageal phase  dysphagia;Suspected primary esophageal dysphagia  MBS completed.  Patient with head down posture with difficulty   maintaining  upright positon.  Assist required to reposition  patient throughout study as view blocked by shoulders.   Evaluation indicates moderate oral dysphagia marked by weak  lingual manipulation and reduced posterior propulsion.  Piecemeal  swallows with all consistencies.  Severe pharyngeal phase  dysphagia with suspected primary esophageal dysphagia.  Decreased  TBR resulting in residuals in vallecular space with all  consistencies.  Reduced pharyngeal peristalsis.  Diffuse residue  from vallecular space to pyriforms s/p swallow of nectar barium  by cup and spoon, puree and mechanical soft consistency.   Prominent CP segment with pooling in pyriforms with all  consistencies prior to swallow with moderate amount of  containment s/p swallow.  Backflow into pharynx during swallow of  thin liquid barium by cup, straw and spoon with eventual  penetration s/p swallow from residuals.  Unable to confirm  aspiration with thins due to shoulders blocking view. Strategies  of multiple effortful swallows with puree consistency and nectar  thick liquids by straw  effective in clearing majority of  residuals  but patient quickly fatigued. No backflow to pharynx  noted with puree and nectar thick barium.    Brief esophageal  sweep revealed significant amount of stasis with backflow to  cervical esophagus.  No radiologist present to confirm.  Patient  at high risk for aspiration with any consistency due to residuals  and risk of backflow of bolus into pharynx from cervical  esophagus.   MD given results of evaluation by treating SLP.  MD  confirmed to proceed with dysphagia 1 consistency and nectar  thick liquids in limited amounts with full supervision with  strict aspiration and reflux precautions.  Aspiration risk  remains even with modified diet.   Diagnostic treatment completed  following evaluation focusing on providing education to  caregivers on swallow strategies to maximize safety.   Patient  may benefit from GI consult to assess current esophageal  functioning.  ST to follow  in acute care setting for diet  tolerance and POC.   Recommend repeat MBS following results of GI  consult.         Diet Recommendation Dysphagia 1 (Puree);Nectar-thick liquid   Liquid Administration via: Straw;Cup Medication Administration: Via alternative means Supervision: Patient able to self feed;Full supervision/cueing  for compensatory strategies Compensations: Slow rate;Small sips/bites;Multiple dry swallows  after each bite/sip;Clear throat intermittently;Hard cough after  swallow;Effortful swallow Postural Changes and/or Swallow Maneuvers: Seated upright 90  degrees;Upright 30-60 min after meal    Other  Recommendations Recommended Consults: Consider GI  evaluation Oral Care Recommendations: Oral care before and after PO Other Recommendations: Order thickener from pharmacy;Prohibited  food (jello, ice cream, thin soups);Have oral suction  available;Clarify dietary restrictions   Follow Up Recommendations  Outpatient SLP    Frequency and Duration min 2x/week  2 weeks       SLP Swallow Goals Patient will utilize recommended  strategies during swallow to  increase swallowing safety with: Total assistance   General Date of Onset: 11/26/12 HPI: Perkins Molina is a 79 y.o. male who was just discharged  from our service 6 days ago after an admission for a UTI with  pseudomonas.  Since discharge he has getting progressively short  of breath until today he developed cough, productive of a whitish  sputum.  He has had no fever, no chills, no sweating since Monday  of this week.  In the ED while his WBC is elevated at 17k this is  significantly down from the >30k it was at discharge 6 days ago.   At the time of discharge he had been taken off of his Zaroxylyn  and had not yet restarted this, he did restart his lasix on  Tuesday. Type of Study: Modified Barium Swallowing Study Reason for Referral: Objectively evaluate swallowing function Previous Swallow Assessment: BSE 11/26/12 Diet Prior to this Study: NPO Temperature Spikes Noted: No Respiratory Status: Supplemental O2 delivered via (comment) History of Recent Intubation: No Behavior/Cognition: Alert;Cooperative;Hard of hearing;Requires  cueing Oral Cavity - Dentition: Missing dentition Oral Motor / Sensory Function: Impaired - see Bedside swallow  eval Self-Feeding Abilities: Able to feed self;Needs assist;Needs set  up Patient Positioning: Upright in chair Baseline Vocal Quality: Breathy;Hoarse;Low vocal intensity Volitional Cough: Strong Volitional Swallow: Able to elicit Pharyngeal Secretions: Not observed secondary MBS    Reason for Referral Objectively evaluate swallowing function   Oral Phase Oral Preparation/Oral Phase Oral Phase: Impaired Oral - Nectar Oral - Nectar Teaspoon: Weak lingual manipulation;Lingual  pumping;Incomplete tongue to palate contact;Holding of  bolus;Reduced posterior propulsion;Lingual/palatal  residue;Piecemeal swallowing;Delayed oral transit Oral - Nectar Cup: Weak lingual manipulation;Lingual  pumping;Incomplete tongue to palate contact;Reduced posterior   propulsion;Holding of bolus;Lingual/palatal residue;Piecemeal  swallowing Oral - Nectar Straw: Weak lingual manipulation;Lingual  pumping;Incomplete tongue to palate contact;Reduced posterior  propulsion;Holding of bolus;Piecemeal swallowing;Lingual/palatal  residue;Delayed oral transit Oral - Thin Oral - Thin Teaspoon: Weak lingual manipulation;Lingual  pumping;Incomplete tongue to palate contact;Impaired  mastication;Lingual/palatal residue;Piecemeal swallowing;Holding  of bolus;Reduced posterior propulsion;Delayed oral transit Oral - Thin Cup: Weak lingual manipulation;Lingual  pumping;Incomplete tongue to palate contact;Reduced posterior  propulsion;Holding of bolus;Lingual/palatal residue;Piecemeal  swallowing;Delayed oral transit Oral - Thin Straw: Weak lingual manipulation;Lingual  pumping;Incomplete tongue to palate contact;Reduced posterior  propulsion;Holding of bolus;Piecemeal swallowing;Lingual/palatal  residue Oral - Solids Oral - Puree: Weak lingual manipulation;Lingual pumping;Holding  of bolus;Reduced posterior propulsion;Lingual/palatal  residue;Piecemeal swallowing;Delayed oral transit Oral - Mechanical Soft: Impaired mastication;Weak lingual  manipulation;Lingual pumping;Incomplete tongue to palate  contact;Piecemeal swallowing;Lingual/palatal residue;Delayed  oral  transit   Pharyngeal Phase Pharyngeal Phase Pharyngeal Phase: Impaired Pharyngeal - Nectar Pharyngeal - Nectar Teaspoon: Premature spillage to  valleculae;Delayed swallow initiation;Premature spillage to  pyriform sinuses;Reduced pharyngeal peristalsis;Reduced anterior  laryngeal mobility;Reduced laryngeal elevation;Reduced  airway/laryngeal closure;Reduced tongue base  retraction;Penetration/Aspiration during  swallow;Penetration/Aspiration after swallow;Pharyngeal residue -  pyriform sinuses;Pharyngeal residue - valleculae;Pharyngeal  residue - cp segment Penetration/Aspiration details (nectar teaspoon): Material enters  airway,  CONTACTS cords then ejected out Pharyngeal - Nectar Cup: Premature spillage to  valleculae;Premature spillage to pyriform sinuses;Delayed swallow  initiation;Reduced pharyngeal peristalsis;Reduced anterior  laryngeal mobility;Reduced laryngeal elevation;Reduced  airway/laryngeal closure;Reduced tongue base  retraction;Pharyngeal residue - pyriform sinuses;Pharyngeal  residue - valleculae;Pharyngeal residue - cp segment Pharyngeal - Thin Pharyngeal - Thin Teaspoon: Delayed swallow initiation;Premature  spillage to valleculae;Premature spillage to pyriform  sinuses;Reduced pharyngeal peristalsis;Reduced anterior laryngeal  mobility;Reduced tongue base retraction;Reduced airway/laryngeal  closure;Penetration/Aspiration after swallow;Pharyngeal residue -  pyriform sinuses;Pharyngeal residue - valleculae;Pharyngeal  residue - cp segment Penetration/Aspiration details (thin teaspoon): Material enters  airway, passes BELOW cords and not ejected out despite cough  attempt by patient Pharyngeal - Thin Cup: Premature spillage to pyriform  sinuses;Delayed swallow initiation;Reduced pharyngeal  peristalsis;Reduced anterior laryngeal mobility;Reduced laryngeal  elevation;Reduced airway/laryngeal closure;Reduced tongue base  retraction;Penetration/Aspiration after swallow;Trace  aspiration;Pharyngeal residue - valleculae;Pharyngeal residue -  pyriform sinuses;Pharyngeal residue - posterior  pharnyx;Pharyngeal residue - cp segment Penetration/Aspiration details (thin cup): Material enters  airway, passes BELOW cords and not ejected out despite cough  attempt by patient Pharyngeal - Thin Straw: Premature spillage to pyriform  sinuses;Reduced pharyngeal peristalsis;Reduced anterior laryngeal  mobility;Reduced laryngeal elevation;Reduced airway/laryngeal  closure;Reduced tongue base retraction;Penetration/Aspiration  after swallow;Pharyngeal residue - pyriform sinuses;Pharyngeal  residue - posterior pharnyx;Pharyngeal residue - cp   segment;Pharyngeal residue - valleculae Penetration/Aspiration details (thin straw): Material enters  airway, passes BELOW cords and not ejected out despite cough  attempt by patient Pharyngeal - Solids Pharyngeal - Puree: Premature spillage to valleculae;Reduced  pharyngeal peristalsis;Reduced anterior laryngeal  mobility;Reduced tongue base retraction;Reduced airway/laryngeal  closure;Reduced laryngeal elevation;Pharyngeal residue - pyriform  sinuses;Pharyngeal residue - posterior pharnyx;Pharyngeal residue  - valleculae;Pharyngeal residue - cp segment Pharyngeal - Mechanical Soft: Premature spillage to  valleculae;Reduced pharyngeal peristalsis;Reduced anterior  laryngeal mobility;Reduced laryngeal elevation;Reduced  airway/laryngeal closure;Reduced tongue base  retraction;Penetration/Aspiration after swallow;Pharyngeal  residue - posterior pharnyx;Pharyngeal residue - cp  segment;Pharyngeal residue - pyriform sinuses;Pharyngeal residue  - valleculae Penetration/Aspiration details (mechanical soft): Material enters  airway, passes BELOW cords then ejected out  Cervical Esophageal Phase    GO    Cervical Esophageal Phase Cervical Esophageal Phase: Impaired Cervical Esophageal Phase - Nectar Nectar Cup: Reduced cricopharyngeal relaxation;Prominent  cricopharyngeal segment;Esophageal backflow into cervical  esophagus Nectar Straw: Prominent cricopharyngeal segment;Esophageal  backflow into cervical esophagus Cervical Esophageal Phase - Thin Thin Cup: Reduced cricopharyngeal relaxation;Prominent  cricopharyngeal segment;Esophageal backflow into cervical  esophagus;Esophageal backflow into the pharynx Cervical Esophageal Phase - Solids Puree: Prominent cricopharyngeal segment;Esophageal backflow into  cervical esophagus;Reduced cricopharyngeal relaxation Mechanical Soft: Reduced cricopharyngeal relaxation;Prominent  cricopharyngeal segment        Moreen Fowler MS, CCC-SLP [phone] [facility] 11/27/2012, 5:25 PM       CXR: 8/25 - ? LLL infiltrate vs atx  ASSESSMENT / PLAN:  PULMONARY A:  VDRF due to ?aspiration PNA resulting in hypercarbic respiratory failure; consider also influence of poor abdominal compliance (retained contrast on abd CT scan, hiatal hernia) as CT abd appears to show LLL scarring Hx of PE - in 2008, on coumadin Difficult Intubation - due to limited  neck flexion P:   - continue mechanical support, daily reassessment for duration of ETT/Mech vent.  Recommend if extubated, would not reintubate - See ID section. - Sedation for comfort  CARDIOVASCULAR A:  Hypotension post intubation - resolved / soft but normotensive AFib - on coumadin Hx HTN HLD PVD  P:  - trend CVP. - Reduce imdur dosing - Levophed if needed. - coumadin per pharmacy  RENAL A:   Worsening Cr, appears pre-renal. Hx of L double J stenting  Hypokalemia Hypophosphatemia P:   - NS at 50 ml /hr - Replace K. - BMET in AM. - Replace Phos  GASTROINTESTINAL A:   Dysphagia - in setting of intubation, ? Underlying with severe dementia GERD P:   - OGT. - Begin TF   HEMATOLOGIC A:  No active issues. P:  - Daily CBC.  INFECTIOUS A:  ?Asp PNA. Note Pct negative P:   - Pan culture. - Vanc and zosyn as ordered  ENDOCRINE A:  DM by history.   P:   - SSI   NEUROLOGIC A:  Obtunded due to hypercarbia and likely sepsis in a patient with severe baseline dementia. P:   - Intermittent sedation protocol.  TODAY'S SUMMARY: Updated wife at bedside, patient did not meet extubation criteria.  Explained extensively that this will be a day to day review of patients status.  Per family, he would be ok with short term intubation but not trach/peg, ok with LCB with no CPR and no cardioversion.     I have personally obtained a history, examined the patient, evaluated laboratory and imaging results, formulated the assessment and plan and placed orders.  CRITICAL CARE: The patient is critically ill  with multiple organ systems failure and requires high complexity decision making for assessment and support, frequent evaluation and titration of therapies, application of advanced monitoring technologies and extensive interpretation of multiple databases. Critical Care Time devoted to patient care services described in this note is 40 minutes.   Levy Pupa, MD, PhD 11/29/2012, 10:01 AM [facility] [phone] or if no answer [phone]

## 2012-11-30 ENCOUNTER — Inpatient Hospital Stay (HOSPITAL_COMMUNITY): Payer: Medicare Other

## 2012-11-30 LAB — CBC
Hemoglobin: 10 g/dL — ABNORMAL LOW (ref 13.0–17.0)
MCH: 29.6 pg (ref 26.0–34.0)
MCHC: 31.3 g/dL (ref 30.0–36.0)
MCV: 94.7 fL (ref 78.0–100.0)
RBC: 3.38 MIL/uL — ABNORMAL LOW (ref 4.22–5.81)

## 2012-11-30 LAB — GLUCOSE, CAPILLARY
Glucose-Capillary: 206 mg/dL — ABNORMAL HIGH (ref 70–99)
Glucose-Capillary: 220 mg/dL — ABNORMAL HIGH (ref 70–99)
Glucose-Capillary: 192 mg/dL — ABNORMAL HIGH (ref 70–99)

## 2012-11-30 LAB — BASIC METABOLIC PANEL
BUN: 43 mg/dL — ABNORMAL HIGH (ref 6–23)
CO2: 33 mEq/L — ABNORMAL HIGH (ref 19–32)
Calcium: 8.7 mg/dL (ref 8.4–10.5)
Calcium: 8.6 mg/dL (ref 8.4–10.5)
Chloride: 98 mEq/L (ref 96–112)
Creatinine, Ser: 1.14 mg/dL (ref 0.50–1.35)
Creatinine, Ser: 0.97 mg/dL (ref 0.50–1.35)
GFR calc Af Amer: 68 mL/min — ABNORMAL LOW (ref >90)
GFR calc non Af Amer: 75 mL/min — ABNORMAL LOW (ref >90)

## 2012-11-30 LAB — PROCALCITONIN: Procalcitonin: <0.1 ng/mL

## 2012-11-30 LAB — PROTIME-INR: INR: 1.82 — ABNORMAL HIGH (ref 0.00–1.49)

## 2012-11-30 MED ORDER — POTASSIUM CHLORIDE 20 MEQ/15ML (10%) PO LIQD
40.0000 meq | ORAL | Status: AC
  Administered 2012-11-30 (×2): 40 meq
  Filled 2012-11-30 (×2): qty 30

## 2012-11-30 MED ORDER — WARFARIN SODIUM 6 MG PO TABS
6.0000 mg | ORAL_TABLET | Freq: Once | ORAL | Status: AC
  Administered 2012-11-30: 6 mg
  Filled 2012-11-30: qty 1

## 2012-11-30 NOTE — Progress Notes (Signed)
Stopped to see patient today He is on a vent and not able to communicate. Nursing staff indicate that he may be weaned off the vent sometime today. Had silent prayer for calm and peace for him.

## 2012-11-30 NOTE — Plan of Care (Signed)
Problem: Phase I Progression Outcomes Goal: EF % per last Echo/documented,Core Reminder form on chart Outcome: Completed/Met Date Met:  11/30/12 EF 11/16/12 60-65%

## 2012-11-30 NOTE — Progress Notes (Addendum)
PULMONARY  / CRITICAL CARE MEDICINE  Name: Frederick [name] MRN: [id number] DOB: [birth year]    ADMISSION DATE:  11/25/2012 CONSULTATION DATE:  11/28/2012  REFERRING MD :  [facility] PRIMARY SERVICE: TRH-->PCCM CHIEF COMPLAINT:  Respiratory failure  BRIEF PATIENT DESCRIPTION: 79 year old male with history of advance dementia that is essentially non-verbal presenting to the hospital with respiratory failure and fluid overload.  Also significant history of dysphagia and ?aspiration.  On 8/24 the patient had significant AMS, very somnolent and barely arousable.  ABG revealed hypercarbic and hypoxic respiratory failure.  SIGNIFICANT EVENTS / STUDIES:  8/24 - VDRF requiring intubation.  LINES / TUBES: ET Tube 8/24>>> OGT 8/24>>>  RIJ TLC 8/24>>> R radial a-line 8/24>>>  CULTURES: Blood 8/24>>> Urine 8/24>>> Sputum 8/24>>>  ANTIBIOTICS: Vanc 8/24>>> Zosyn 8/24>>>   SUBJECTIVE:  RN reports improved SBT, mental status   VITAL SIGNS: Temp:  [97.5 F (36.4 C)-99.3 F (37.4 C)] 97.5 F (36.4 C) (08/26 0803) Pulse Rate:  [84-97] 85 (08/26 0803) Resp:  [13-24] 23 (08/26 0803) BP: (109-159)/(56-81) 122/69 mmHg (08/26 0803) SpO2:  [100 %] 100 % (08/26 0803) Arterial Line BP: (110-161)/(55-139) 156/83 mmHg (08/26 0800) FiO2 (%):  [30 %] 30 % (08/26 0803) Weight:  [216 lb 0.8 oz (98 kg)] 216 lb 0.8 oz (98 kg) (08/26 0500)  HEMODYNAMICS: CVP:  [3 mmHg-13 mmHg] 13 mmHg  VENTILATOR SETTINGS: Vent Mode:  [-] CPAP FiO2 (%):  [30 %] 30 % Set Rate:  [14 bmp] 14 bmp Vt Set:  [450 mL] 450 mL PEEP:  [5 cmH20] 5 cmH20 Pressure Support:  [20 cmH20] 20 cmH20 Plateau Pressure:  [18 cmH20-22 cmH20] 19 cmH20  INTAKE / OUTPUT: Intake/Output     08/25 0701 - 08/26 0700 08/26 0701 - 08/27 0700   I.V. (mL/kg) 1282.3 (13.1)    NG/GT 500 120   IV Piggyback 807.5    Total Intake(mL/kg) 2589.8 (26.4) 120 (1.2)   Urine (mL/kg/hr) 1080 (0.5) 425 (1.4)   Emesis/NG output 5 (0)    Total Output 1085 425    Net +1504.8 -305          PHYSICAL EXAMINATION: General:  Chronically ill appearing elderly male, NAD. Neuro:  Awake / alert, nods, moving ext's HEENT:  [place]/AT, PERRL, EOM-I, OETT Cardiovascular:  RRR, Nl S1/S2, -M/R/G. Lungs:  Coarse BS diffusely L>R. Abdomen:  Soft, NT, ND and +BS. Musculoskeletal:  -edema and -tenderness. Skin:  Intact.  LABS:  CBC Recent Labs     11/28/12  0506  11/29/12  0445  11/30/12  0400  WBC  12.8*  13.6*  11.4*  HGB  13.0  11.3*  10.0*  HCT  41.6  36.2*  32.0*  PLT  453*  387  352   Coag's Recent Labs     11/28/12  0506  11/29/12  0445  11/30/12  0400  INR  2.62*  1.92*  1.82*   BMET Recent Labs     11/28/12  0931  11/29/12  0445  11/30/12  0400  NA  142  145  140  K  4.1  3.3*  2.8*  CL  91*  100  98  CO2  43*  35*  34*  BUN  43*  41*  43*  CREATININE  1.46*  1.41*  1.14  GLUCOSE  205*  192*  258*   Electrolytes Recent Labs     11/28/12  0506  11/28/12  0931  11/29/12  0445  11/30/12  0400  CALCIUM  9.8  9.7  8.9  8.7  MG  1.9   --   1.8  1.9  PHOS   --    --   2.0*  3.1   Sepsis Markers Recent Labs     11/28/12  0931  11/29/12  0445  11/30/12  0400  PROCALCITON  <0.10  <0.10  <0.10   ABG Recent Labs     11/28/12  1145  11/29/12  0430  11/29/12  0627  PHART  7.582*  7.598*  7.539*  PCO2ART  41.0  31.7*  39.9  PO2ART  428.0*  99.0  95.3   Glucose Recent Labs     11/29/12  1133  11/29/12  1531  11/29/12  1920  11/29/12  2323  11/30/12  0330  11/30/12  0753  GLUCAP  172*  211*  215*  206*  188*  231*    Recent Labs Lab 11/28/12 0931 11/29/12 0445 11/30/12 0400  PROCALCITON <0.10 <0.10 <0.10     Imaging Dg Chest Port 1 View  11/30/2012   *RADIOLOGY REPORT*  Clinical Data: Atelectasis, endotracheal tube placement.  PORTABLE CHEST - 1 VIEW  Comparison: 11/29/2012.  Findings: Endotracheal tube terminates approximately 1.7 cm above the carina.  Nasogastric tube is followed into the stomach.   Right IJ central line tip projects over the SVC.  Heart size is grossly stable.  Left lower lobe collapse/consolidation and left pleural effusion persist.  Lungs are overall low in volume with mild right basilar air space disease as well.  Surgical clips are seen in the upper abdomen.  IMPRESSION:  1.  Persistent left lower lobe collapse/consolidation.  Follow-up to clearing is recommended. 2.  Mild bibasilar air space disease.   Original Report Authenticated By: Leanna Battles, M.D.   Dg Chest Port 1 View  11/29/2012   *RADIOLOGY REPORT*  Clinical Data: Respiratory failure.  PORTABLE CHEST - 1 VIEW  Comparison: 11/28/2012  Findings: Endotracheal tube remains with the tip approximately 2 cm above the carina.  Central line and nasogastric tube positioning are stable.  Lungs show relatively stable left lower lobe atelectasis.  No pulmonary edema or pleural effusions are identified.  Heart size is stable.  IMPRESSION: Stable left lower lobe atelectasis.   Original Report Authenticated By: Irish [name], M.D.   Dg Chest Port 1 View  11/28/2012   *RADIOLOGY REPORT*  Clinical Data: Respiratory distress.  Intubation, central venous catheter placement and nasogastric tube placement.  PORTABLE CHEST - 1 VIEW 11/28/2012 1029 hours:  Comparison: Portable chest x-ray earlier same day 0851 hours.  Findings: Endotracheal tube tip projects approximately 2 cm above carina.  Right jugular central venous catheter tip projects over the lower SVC.  No evidence of pneumothorax mediastinal hematoma. Nasogastric tube tip in the fundus the stomach.  Cardiac silhouette enlarged but stable.  Mild pulmonary venous hypertension, improved since earlier in the morning, without overt edema currently. Slight improved aeration in the left lower lobe, though moderate consolidation persist.  No new pulmonary parenchymal abnormalities.  IMPRESSION:  1.  Endotracheal tube tip approximately 2 cm above the carina. 2.  Right jugular central venous  catheter tip in the lower SVC.  No acute complicating features. 3.  Nasogastric tube tip in the gastric fundus. 4.  Interval resolution of interstitial pulmonary edema since earlier same date.  Improved aeration in the left lower lobe, though moderate atelectasis and/or pneumonia persists.  No new abnormalities.   Original Report Authenticated By: Hulan Saas, M.D.  CXR: 8/26 - ? LLL infiltrate vs atx, film unchanged  ASSESSMENT / PLAN:  PULMONARY A:  VDRF due to ?aspiration PNA resulting in hypercarbic respiratory failure; consider also influence of poor abdominal compliance (retained contrast on abd CT scan, hiatal hernia) as CT abd appears to show LLL scarring Hx of PE - in 2008, on coumadin Difficult Intubation - due to limited neck flexion P:   - continue mechanical support, daily reassessment for duration of ETT/Mech vent.  Recommend if extubated, would not reintubate - See ID section. - Sedation for comfort  CARDIOVASCULAR A:  Hypotension post intubation - resolved / soft but normotensive AFib - on coumadin Hx HTN HLD PVD  P:  - trend CVP. - Reduce imdur dosing while intubated - coumadin per pharmacy  RENAL A:   AKI - Worsening Cr on admit appears pre-renal.  Resolved.  Hx of L double J stenting  Hypokalemia Hypophosphatemia P:   - NS at 50 ml /hr - Replace K. - BMET in AM. - Replace Phos  GASTROINTESTINAL A:   Dysphagia - in setting of intubation, ? Underlying with severe dementia GERD P:   - OGT. - Begin TF - will need SLP eval once extubated   HEMATOLOGIC A:  No active issues. P:  - Daily CBC.  INFECTIOUS A:  ?Asp PNA. Note Pct negative P:   - Pan culture. - Vanc and zosyn, low threshold to narrow / discontinue  ENDOCRINE A:  DM by history.   P:   - SSI   NEUROLOGIC A:  Obtunded - due to hypercarbia and likely sepsis in a patient with severe baseline dementia.  P:   - Intermittent sedation protocol.  TODAY'S SUMMARY: Updated  wife at bedside, patient looks better 8/26 in regards to extubation.  Explained extensively that this will be a day to day review of patients status.  Per family, he would be ok with short term intubation but not trach/peg, ok with LCB with no CPR and no cardioversion.  Discussions have been that we will get him to the best point possible with extubation and no plans to reintubate. Wife is still contemplating this and may not be completely comfortable with withdrawal if he fails extubation. We agreed to discuss further.     I have personally obtained a history, examined the patient, evaluated laboratory and imaging results, formulated the assessment and plan and placed orders.  CRITICAL CARE: The patient is critically ill with multiple organ systems failure and requires high complexity decision making for assessment and support, frequent evaluation and titration of therapies, application of advanced monitoring technologies and extensive interpretation of multiple databases. Critical Care Time devoted to patient care services described in this note is 40 minutes.   Levy Pupa, MD, PhD 11/30/2012, 5:20 PM [facility] [phone] or if no answer [phone]

## 2012-11-30 NOTE — Progress Notes (Signed)
ANTICOAGULATION CONSULT NOTE - Follow Up Consult  Pharmacy Consult for warfarin Indication: h/o pulmonary embolism, afib  Allergies  Allergen Reactions  . Hydromorphone Hcl Other (See Comments)    Dilaudid caused Confusion   . Morphine Other (See Comments)    confusion  . Pregabalin Other (See Comments)    lyrica - caused hallucinations    Labs:  Recent Labs  11/28/12 0506 11/28/12 0931 11/29/12 0445 11/30/12 0400  HGB 13.0  --  11.3* 10.0*  HCT 41.6  --  36.2* 32.0*  PLT 453*  --  387 352  LABPROT 27.1*  --  21.4* 20.5*  INR 2.62*  --  1.92* 1.82*  CREATININE 1.45* 1.46* 1.41* 1.14    Assessment: 79 y/o M recently discharged from [facility] 8/16 for sepsis secondary to Pseudomonas UTI, presented 8/21 with progressive SOB, productive cough. Patient with h/o afib and PE, on chronic warfarin. Patient was reportedly taking warfarin 5mg  po daily with last dose 8/21.  On ventilator since 8/24 with possible aspiration pneumonia, now receiving medications via orogastric tube.  Inpatient warfarin doses administered 8/22 - 8/25:  5mg , 4mg , 4mg , 4mg   INR (1.82) still trending down  H/H and pltc acceptable, no bleeding reported.  Now off ciprofloxacin and on tube feeding - may therefore require slightly larger warfarin doses.  Goal of Therapy:  INR 2-3 Monitor platelets by anticoagulation protocol: Yes   Plan:   Boost with warfarin 6mg  via tube tonight.  Daily PT/INR  Pharmacy will f/u daily.  Elie Goody, PharmD, BCPS Pager: [phone] 11/30/2012  11:16 AM   .

## 2012-11-30 NOTE — Progress Notes (Signed)
[facility] ADULT ICU REPLACEMENT PROTOCOL FOR AM LAB REPLACEMENT ONLY  The patient does apply for the [facility] Adult ICU Electrolyte Replacment Protocol based on the criteria listed below:   1. Is GFR >/= 40 ml/min? yes  Patient's GFR today is 58 2. Is urine output >/= 0.5 ml/kg/hr for the last 6 hours? yes Patient's UOP is 1.1 ml/kg/hr 3. Is BUN < 60 mg/dL? yes  Patient's BUN today is 43 4. Abnormal electrolyte(s): K 2.8 5. Ordered repletion with: per protocol 6. If [name] panic level lab has been reported, has the CCM MD in charge been notified? yes.   Physician:  Dr Darrick Penna  [name], Frederick [name] 11/30/2012 5:17 AM

## 2012-11-30 NOTE — Progress Notes (Signed)
NUTRITION FOLLOW UP  Intervention:   Continue Vital AF 1.2 @ 20 ml/hr via OGT and hold at goal rate of 20 ml/hr. 30 ml Prostat 7 times daily.  At goal rate, tube feeding regimen provides 1276 kcal, 141 grams of protein, and 389 ml of H2O.  Continue 150 ml free water flushes q 6 hours. Continue Liquid Multivitamin per tube   Nutrition Dx: Inadequate oral intake related to inability to eat as evidenced by NPO status and intubation; ongoing  Goal: Enteral nutrition to provide 60-70% of estimated calorie needs (22-25 kcals/kg ideal body weight) and 100% of estimated protein needs, based on ASPEN guidelines for permissive underfeeding in critically ill obese individuals.  Monitor:   TF initiation/tolerance; 100 ml residual Vent status; intubated, weaning Weight; stable Labs; low potassium, high glucose, high BUN, low GFR, low hemoglobin  Assessment:   Pt underwent a modified barium swallowing study on 8/23 and diet was advanced to dysphagia 1 diet with nectar-thick liquids. 8/24 pt became very somnolent and barely arousable, ABG revealed hypercarbic and hypoxic respiratory failure, and pt was intubated. RD consulted for TF initiation and management. TF running at goal rate of 20 ml/hr and pt tolerating; 100 ml residual this AM. Propofol has been turned off for now and pt is being weaned per RN.  Current TF regimen meets 65% of estimated calorie needs and 100% of estimated protein needs.   Height: Ht Readings from Last 1 Encounters:  11/26/12 5\' 8"  (1.727 m)    Weight Status:   Wt Readings from Last 1 Encounters:  11/30/12 216 lb 0.8 oz (98 kg)   Patient is currently intubated on ventilator support.  MV: 10.3 L/min Temp:Temp (24hrs), Avg:98.2 F (36.8 C), Min:97.5 F (36.4 C), Max:99.3 F (37.4 C)  Propofol: none  Re-estimated needs:  Kcal: 1951 (Goal:1170-1366) Protein: 140-154 grams Fluid: 2.6 L/day  Skin: +1 RLE and LLE edema; stage 2 pressure ulcer on sacrum, wound on  left leg  Diet Order:     Intake/Output Summary (Last 24 hours) at 11/30/12 1058 Last data filed at 11/30/12 1000  Gross per 24 hour  Intake 2765.04 ml  Output   1360 ml  Net 1405.04 ml    Last BM: 8/26   Labs:   Recent Labs Lab 11/28/12 0506 11/28/12 0931 11/29/12 0445 11/30/12 0400  NA 140 142 145 140  K 3.3* 4.1 3.3* 2.8*  CL 90* 91* 100 98  CO2 43* 43* 35* 34*  BUN 41* 43* 41* 43*  CREATININE 1.45* 1.46* 1.41* 1.14  CALCIUM 9.8 9.7 8.9 8.7  MG 1.9  --  1.8 1.9  PHOS  --   --  2.0* 3.1  GLUCOSE 131* 205* 192* 258*    CBG (last 3)   Recent Labs  11/29/12 2323 11/30/12 0330 11/30/12 0753  GLUCAP 206* 188* 231*    Scheduled Meds: . antiseptic oral rinse  15 mL Mouth Rinse QID  . chlordiazePOXIDE  10 mg Oral TID  . chlorhexidine  15 mL Mouth Rinse BID  . DULoxetine  60 mg Oral QHS  . feeding supplement  30 mL Per Tube Q24H  . feeding supplement  60 mL Per Tube TID  . feeding supplement (VITAL AF 1.2 CAL)  1,000 mL Per Tube Q24H  . free water  150 mL Per Tube Q6H  . insulin aspart  2-6 Units Subcutaneous Q4H  . irbesartan  150 mg Oral Q breakfast  . isosorbide dinitrate  15 mg Per Tube BID  .  multivitamin  5 mL Per Tube Daily  . pantoprazole (PROTONIX) IV  40 mg Intravenous QHS  . piperacillin-tazobactam (ZOSYN)  IV  3.375 g Intravenous Q8H  . senna-docusate  1 tablet Oral BID  . vancomycin  1,250 mg Intravenous Q24H  . Warfarin - Pharmacist Dosing Inpatient   Does not apply q1800    Continuous Infusions: . sodium chloride 1,000 mL (11/30/12 0352)  . propofol Stopped (11/30/12 1000)    Ian Malkin RD, LDN Inpatient Clinical Dietitian Pager: [phone] After Hours Pager: [phone]

## 2012-12-01 ENCOUNTER — Inpatient Hospital Stay (HOSPITAL_COMMUNITY): Payer: Medicare Other

## 2012-12-01 LAB — CBC
HCT: 32 % — ABNORMAL LOW (ref 39.0–52.0)
Hemoglobin: 10.2 g/dL — ABNORMAL LOW (ref 13.0–17.0)
WBC: 12.6 10*3/uL — ABNORMAL HIGH (ref 4.0–10.5)

## 2012-12-01 LAB — BASIC METABOLIC PANEL
BUN: 62 mg/dL — ABNORMAL HIGH (ref 6–23)
CO2: 30 mEq/L (ref 19–32)
Chloride: 102 mEq/L (ref 96–112)
GFR calc Af Amer: 34 mL/min — ABNORMAL LOW (ref >90)
Potassium: 3.8 mEq/L (ref 3.5–5.1)

## 2012-12-01 LAB — CK TOTAL AND CKMB (NOT AT ARMC)
CK, MB: 3.6 ng/mL (ref 0.3–4.0)
Relative Index: INVALID (ref 0.0–2.5)

## 2012-12-01 LAB — PROTIME-INR
INR: 1.05 (ref 0.00–1.49)
Prothrombin Time: 13.5 seconds (ref 11.6–15.2)

## 2012-12-01 LAB — GLUCOSE, CAPILLARY
Glucose-Capillary: 216 mg/dL — ABNORMAL HIGH (ref 70–99)
Glucose-Capillary: 186 mg/dL — ABNORMAL HIGH (ref 70–99)
Glucose-Capillary: 196 mg/dL — ABNORMAL HIGH (ref 70–99)

## 2012-12-01 LAB — CULTURE, RESPIRATORY W GRAM STAIN

## 2012-12-01 LAB — LACTIC ACID, PLASMA: Lactic Acid, Venous: 1.4 mmol/L (ref 0.5–2.2)

## 2012-12-01 LAB — PROCALCITONIN: Procalcitonin: <0.1 ng/mL

## 2012-12-01 MED ORDER — HEPARIN SODIUM (PORCINE) 5000 UNIT/ML IJ SOLN
5000.0000 [IU] | Freq: Two times a day (BID) | INTRAMUSCULAR | Status: DC
  Administered 2012-12-01 (×2): 5000 [IU] via SUBCUTANEOUS
  Filled 2012-12-01 (×5): qty 1

## 2012-12-01 MED ORDER — WARFARIN SODIUM 7.5 MG PO TABS
7.5000 mg | ORAL_TABLET | Freq: Once | ORAL | Status: AC
  Administered 2012-12-01: 7.5 mg
  Filled 2012-12-01 (×2): qty 1

## 2012-12-01 MED ORDER — HALOPERIDOL LACTATE 5 MG/ML IJ SOLN
1.0000 mg | INTRAMUSCULAR | Status: DC | PRN
  Administered 2012-12-01: 2 mg via INTRAVENOUS
  Filled 2012-12-01: qty 1

## 2012-12-01 MED ORDER — CHLORDIAZEPOXIDE HCL 5 MG PO CAPS
5.0000 mg | ORAL_CAPSULE | Freq: Three times a day (TID) | ORAL | Status: DC
  Administered 2012-12-01 – 2012-12-07 (×16): 5 mg via ORAL
  Filled 2012-12-01 (×17): qty 1

## 2012-12-01 MED ORDER — POTASSIUM CHLORIDE 20 MEQ/15ML (10%) PO LIQD
40.0000 meq | Freq: Once | ORAL | Status: AC
  Administered 2012-12-01: 40 meq
  Filled 2012-12-01: qty 30

## 2012-12-01 MED ORDER — DULOXETINE HCL 30 MG PO CPEP
30.0000 mg | ORAL_CAPSULE | Freq: Every day | ORAL | Status: DC
  Administered 2012-12-01 – 2012-12-06 (×6): 30 mg via ORAL
  Filled 2012-12-01 (×8): qty 1

## 2012-12-01 MED ORDER — SODIUM CHLORIDE 0.9 % IV BOLUS (SEPSIS)
500.0000 mL | Freq: Once | INTRAVENOUS | Status: AC
  Administered 2012-12-01: 500 mL via INTRAVENOUS

## 2012-12-01 MED ORDER — SODIUM CHLORIDE 0.9 % IV SOLN
10.0000 ug/h | INTRAVENOUS | Status: DC
  Administered 2012-12-01: 20 ug/h via INTRAVENOUS
  Filled 2012-12-01: qty 50

## 2012-12-01 NOTE — Progress Notes (Signed)
PULMONARY  / CRITICAL CARE MEDICINE  Name: Frederick [name] MRN: [id number] DOB: [birth year]    ADMISSION DATE:  11/25/2012 CONSULTATION DATE:  11/28/2012  REFERRING MD :  [facility] PRIMARY SERVICE: TRH-->PCCM CHIEF COMPLAINT:  Respiratory failure  BRIEF PATIENT DESCRIPTION: 79 year old male with history of advance dementia (apparently NOT SO per RN and wife 12/01/12 - lived at home and performed ADLs) but lives at SNF presenting to the hospital with respiratory failure and fluid overload.  Also significant history of dysphagia and ?aspiration.  On 8/24 the patient had significant AMS, very somnolent and barely arousable.  ABG revealed hypercarbic and hypoxic respiratory failure.   has a past medical history of Diabetes mellitus; Hypertension; Hypercholesterolemia; Esophageal stricture; Pulmonary embolism (2008); Coronary artery disease; Atrial fibrillation; Anxiety; Depression; Shortness of breath; Peripheral vascular disease; Bladder tumor; GERD (gastroesophageal reflux disease); Edema; Cancer; Arthritis; Pneumonia (2005); Difficult intubation; and Problems with hearing.   has past surgical history that includes Shoulder surgery; Pancreas surgery (2001); Total knee arthroplasty; Lung surgery (2007); Basil cell; Cardiac catheterization (2008); ESOPHAGEAL STRETCH; SKIN CANCER REMOVED FROM LEFT SIDE OF HEAD - REQUIRED SKIN GRAFT FROM LEFT LEG; CARTILAGE REPAIR 198- LEFT KNEE; Joint replacement; Transurethral resection of bladder tumor with gyrus (turbt-gyrus) (N/A, 11/03/2012); Cystoscopy/retrograde/ureteroscopy (11/03/2012); and Cystogram (N/A, 11/03/2012).    LINES / TUBES: ET Tube 8/24>>> OGT 8/24>>>  RIJ TLC 8/24>>> R radial a-line 8/24>>> 11/30/12  CULTURES: 11/26/12- MRSA PCR _ POSITIVE Blood 8/24>>> Urine 8/24>>> Sputum 8/24>>>     ANTIBIOTICS: Vanc 8/24>>> Zosyn 8/24>>>  SIGNIFICANT EVENTS / STUDIES:  8/24 - VDRF requiring intubation. 12/01/12 - learning from nursting staff that he DOES  NTO HAVE dementia  SUBJECTIVE:    12/01/12: Off diprivan: RASS 0. Suddent jump in creatinine today  - doubling. Failed SBT - set off apnea alarms. Otherwsie stable. Wife at bedside. RNs say no dementia    VITAL SIGNS: Temp:  [97 F (36.1 C)-98.2 F (36.8 C)] 97.6 F (36.4 C) (08/27 0800) Pulse Rate:  [78-96] 96 (08/27 0635) Resp:  [13-27] 19 (08/27 0635) BP: (104-149)/(60-97) 125/83 mmHg (08/27 0500) SpO2:  [100 %] 100 % (08/27 0635) FiO2 (%):  [30 %] 30 % (08/27 0845) Weight:  [100.2 kg (220 lb 14.4 oz)] 100.2 kg (220 lb 14.4 oz) (08/27 0635)  HEMODYNAMICS:    VENTILATOR SETTINGS: Vent Mode:  [-] PRVC FiO2 (%):  [30 %] 30 % Set Rate:  [14 bmp] 14 bmp Vt Set:  [450 mL] 450 mL PEEP:  [5 cmH20] 5 cmH20 Pressure Support:  [10 cmH20] 10 cmH20 Plateau Pressure:  [18 cmH20-21 cmH20] 19 cmH20  INTAKE / OUTPUT: Intake/Output     08/26 0701 - 08/27 0700 08/27 0701 - 08/28 0700   I.V. (mL/kg) 1159.1 (11.6)    NG/GT 560    IV Piggyback 400    Total Intake(mL/kg) 2119.1 (21.1)    Urine (mL/kg/hr) 1895 (0.8)    Emesis/NG output     Total Output 1895     Net +224.1          Stool Occurrence 2 x         PHYSICAL EXAMINATION: General:  Chronically ill appearing elderly male, NAD. Neuro:  Awake / alert, nods, moving ext's HEENT:  [place]/AT, PERRL, EOM-I, OETT Cardiovascular:  RRR, Nl S1/S2, -M/R/G. Lungs:  Coarse BS diffusely L>R. Abdomen:  Soft, NT, ND and +BS. Musculoskeletal:  -edema and -tenderness. Skin:  Intact.  LABS: PULMONARY  Recent Labs Lab 11/28/12 0842 11/28/12 1145 11/29/12 0430  11/29/12 0627  PHART 7.360 7.582* 7.598* 7.539*  PCO2ART 80.3* 41.0 31.7* 39.9  PO2ART 63.6* 428.0* 99.0 95.3  HCO3 44.3* 38.7* 31.2* 33.9*  TCO2 39.8 33.2 27.4 30.1  O2SAT 89.7 99.2 97.2 96.8    CBC  Recent Labs Lab 11/29/12 0445 11/30/12 0400 12/01/12 0437  HGB 11.3* 10.0* 10.2*  HCT 36.2* 32.0* 32.0*  WBC 13.6* 11.4* 12.6*  PLT 387 352 256     COAGULATION  Recent Labs Lab 11/27/12 0538 11/28/12 0506 11/29/12 0445 11/30/12 0400 12/01/12 0437  INR 2.64* 2.62* 1.92* 1.82* 1.05    CARDIAC   Recent Labs Lab 11/26/12 0218 11/26/12 0810 11/26/12 1349  TROPONINI <0.30 <0.30 <0.30    Recent Labs Lab 11/27/12 0538  PROBNP 1393.0*     CHEMISTRY  Recent Labs Lab 11/26/12 0218 11/27/12 0538 11/28/12 0506 11/28/12 0931 11/29/12 0445 11/30/12 0400 11/30/12 1935 12/01/12 0437  NA  --  142 140 142 145 140 142 143  K  --  3.1* 3.3* 4.1 3.3* 2.8* 3.2* 3.8  CL  --  90* 90* 91* 100 98 104 102  CO2  --  44* 43* 43* 35* 34* 33* 30  GLUCOSE  --  92 131* 205* 192* 258* 261* 121*  BUN  --  23 41* 43* 41* 43* 37* 62*  CREATININE  --  1.25 1.45* 1.46* 1.41* 1.14 0.97 2.02*  CALCIUM  --  10.0 9.8 9.7 8.9 8.7 8.6 9.7  MG 1.9 1.7 1.9  --  1.8 1.9  --   --   PHOS  --   --   --   --  2.0* 3.1  --   --    Estimated Creatinine Clearance: 32.9 ml/min (by C-G formula based on Cr of 2.02).   LIVER  Recent Labs Lab 11/27/12 0538 11/28/12 0506 11/29/12 0445 11/30/12 0400 12/01/12 0437  INR 2.64* 2.62* 1.92* 1.82* 1.05     INFECTIOUS  Recent Labs Lab 11/28/12 0931 11/29/12 0445 11/30/12 0400  LATICACIDVEN 1.6  --   --   PROCALCITON <0.10 <0.10 <0.10     ENDOCRINE CBG (last 3)   Recent Labs  11/30/12 2310 12/01/12 0342 12/01/12 0733  GLUCAP 192* 216* 186*         IMAGING x48h  Dg Chest Port 1 View  12/01/2012   *RADIOLOGY REPORT*  Clinical Data: Evaluate endotracheal tube  PORTABLE CHEST - 1 VIEW  Comparison: Prior chest x-ray 11/27/2012  Findings: The tip of the endotracheal tube is 1.7 cm above the carina.  Stable position of right IJ central venous catheter with the tip at the superior cavoatrial junction.  The tip of the nasogastric tube projects over the stomach. Slightly improved left lower lobe collapse.  Persistent bilateral layering effusions and associated bibasilar opacities.   Multiple surgical clips project over the left upper quadrant and mid epigastric region. Incompletely imaged left double-J ureteral stent.  Stable cardiomegaly and aortic atherosclerosis.  IMPRESSION:  1.  Slightly improved left lower lobe atelectasis/collapse. 2.  Persistent left greater than right layering pleural effusions and associated bibasilar atelectasis versus infiltrate 3.  Stable support apparatus.  The tip of the endotracheal tube is 1.7 cm above the carina.   Original Report Authenticated By: Malachy Moan, M.D.   Dg Chest Port 1 View  11/30/2012   *RADIOLOGY REPORT*  Clinical Data: Atelectasis, endotracheal tube placement.  PORTABLE CHEST - 1 VIEW  Comparison: 11/29/2012.  Findings: Endotracheal tube terminates approximately 1.7 cm above the carina.  Nasogastric  tube is followed into the stomach.  Right IJ central line tip projects over the SVC.  Heart size is grossly stable.  Left lower lobe collapse/consolidation and left pleural effusion persist.  Lungs are overall low in volume with mild right basilar air space disease as well.  Surgical clips are seen in the upper abdomen.  IMPRESSION:  1.  Persistent left lower lobe collapse/consolidation.  Follow-up to clearing is recommended. 2.  Mild bibasilar air space disease.   Original Report Authenticated By: Leanna Battles, M.D.      Imaging Dg Chest Port 1 View  12/01/2012   *RADIOLOGY REPORT*  Clinical Data: Evaluate endotracheal tube  PORTABLE CHEST - 1 VIEW  Comparison: Prior chest x-ray 11/27/2012  Findings: The tip of the endotracheal tube is 1.7 cm above the carina.  Stable position of right IJ central venous catheter with the tip at the superior cavoatrial junction.  The tip of the nasogastric tube projects over the stomach. Slightly improved left lower lobe collapse.  Persistent bilateral layering effusions and associated bibasilar opacities.  Multiple surgical clips project over the left upper quadrant and mid epigastric region.  Incompletely imaged left double-J ureteral stent.  Stable cardiomegaly and aortic atherosclerosis.  IMPRESSION:  1.  Slightly improved left lower lobe atelectasis/collapse. 2.  Persistent left greater than right layering pleural effusions and associated bibasilar atelectasis versus infiltrate 3.  Stable support apparatus.  The tip of the endotracheal tube is 1.7 cm above the carina.   Original Report Authenticated By: Malachy Moan, M.D.   Dg Chest Port 1 View  11/30/2012   *RADIOLOGY REPORT*  Clinical Data: Atelectasis, endotracheal tube placement.  PORTABLE CHEST - 1 VIEW  Comparison: 11/29/2012.  Findings: Endotracheal tube terminates approximately 1.7 cm above the carina.  Nasogastric tube is followed into the stomach.  Right IJ central line tip projects over the SVC.  Heart size is grossly stable.  Left lower lobe collapse/consolidation and left pleural effusion persist.  Lungs are overall low in volume with mild right basilar air space disease as well.  Surgical clips are seen in the upper abdomen.  IMPRESSION:  1.  Persistent left lower lobe collapse/consolidation.  Follow-up to clearing is recommended. 2.  Mild bibasilar air space disease.   Original Report Authenticated By: Leanna Battles, M.D.       ASSESSMENT / PLAN:  PULMONARY A:  VDRF due to ?aspiration PNA resulting in hypercarbic respiratory failure; consider also influence of poor abdominal compliance (retained contrast on abd CT scan, hiatal hernia) as CT abd appears to show LLL scarring Hx of PE - in 2008, on coumadin Difficult Intubation - due to limited neck flexion  12/01/12: FAiled SBT - triggered apnea alarms. Possibly due to overhang of mutliple CNS meds in face of worsening creat  P:   - continue mechanical support, daily reassessment for duration of ETT/Mech vent.  Recommend if extubated, would not reintubate - See ID section. - Sedation for comfort; prn fentanyl only. DC all others  CARDIOVASCULAR A:   Hypotension post intubation - resolved / soft but normotensive AFib - on coumadin Hx HTN HLD PVD  12/01/12: INR going down despite coumadin in face of tube feeds. Creat up  P:  - - dC Avpro ARB - reduced imdur dosing while intubated - coumadin per pharmacy; sq heparin till INR therapeutic  RENAL A:   AKI - Worsening Cr on admit appears pre-renal.  Resolved.  Hx of L double J stenting  Hypokalemia Hypophosphatemia   12/01/12: Doubling  in creatinine. AKIN STAGE 2. RIFLE - INJURY. MAKING URINE. GFR 32    P:   - DC DIPRIVAN - Check CK, Lactic acid, rule out diprivan infusion syndrome - DC VANC - DC ARB  - DC VERSED - HALVE DLOXETINE HOME  DOSE HALVE LIBRIUM HOME DOSE  - pharmacy to adjsut zosyn   GASTROINTESTINAL A:   Dysphagia - in setting of intubation, GERD P:   - OGT. - Begin TF - will need SLP eval once extubated   HEMATOLOGIC A:  No active issues. P:  - Daily CBC.  INFECTIOUS A:  ?Asp PNA. Note Pct negative P:   - Pan culture. - DC VANC - recheck PCT and if culture negative dc zosyn  ENDOCRINE A:  DM by history.   P:   - SSI   NEUROLOGIC Baseline: No dementia per wife. [place] VEt. Likely PTSD - on duloextine and librium (latter since 1970s)  A:  Obtunded - due to hypercarbia and likely sepsis in a patient with severe baseline dementia.   12/01/12: AWake but stil set off apnea alarms  P:   - Intermittent sedation protocol with fentanyl - dc diprivan - dc versed -halve cymbalta and librium - home meds  TODAY'S SUMMARY:   11/30/12: Updated wife at bedside, patient looks better 8/26 in regards to extubation.  Explained extensively that this will be a day to day review of patients status.  Per family, he would be ok with short term intubation but not trach/peg, ok with LCB with no CPR and no cardioversion.  Discussions have been that we will get him to the best point possible with extubation and no plans to reintubate. Wife is still  contemplating this and may not be completely comfortable with withdrawal if he fails extubation. We agreed to discuss further.   11/11/12: Wife and niece updated. Keep intubated   The patient is critically ill with multiple organ systems failure and requires high complexity decision making for assessment and support, frequent evaluation and titration of therapies, application of advanced monitoring technologies and extensive interpretation of multiple databases.   Critical Care Time devoted to patient care services described in this note is  45  Minutes.  Dr. Kalman Shan, M.D., [facility].C.P Pulmonary and Critical Care Medicine Staff Physician [facility] [place] Pager: [phone], If no answer or between  15:00h - 7:00h: call [phone]  12/01/2012 10:49 AM

## 2012-12-01 NOTE — Progress Notes (Signed)
Inpatient Diabetes Program Recommendations  AACE/ADA: New Consensus Statement on Inpatient Glycemic Control (2013)  Target Ranges:  Prepandial:   less than 140 mg/dL      Peak postprandial:   less than 180 mg/dL (1-2 hours)      Critically ill patients:  140 - 180 mg/dL   Reason for Visit: Hyperglycemia  Results for Frederick [name], Frederick [name] (MRN [id number]) as of 12/01/2012 15:00  Ref. Range 11/30/2012 07:53 11/30/2012 11:48 11/30/2012 15:52 11/30/2012 19:41 11/30/2012 23:10 12/01/2012 03:42 12/01/2012 07:33 12/01/2012 11:48  Glucose-Capillary Latest Range: 70-99 mg/dL 829 (H) 562 (H) 130 (H) 220 (H) 192 (H) 216 (H) 186 (H) 193 (H)    Inpatient Diabetes Program Recommendations Insulin - Basal: Add Lantus 10 units QHS  Note: Will follow.  Thank you. Ailene Ards, RD, LDN, CDE Inpatient Diabetes Coordinator [phone]

## 2012-12-01 NOTE — Progress Notes (Signed)
eLink Physician-Brief Progress Note Patient Name: Locklan Canoy DOB: Sep 16, 1929 MRN: [id number]  Date of Service  12/01/2012   HPI/Events of Note  Hypokalemia   eICU Interventions  Potassium replaced   Intervention Category Minor Interventions: Electrolytes abnormality - evaluation and management  DETERDING,ELIZABETH 12/01/2012, 12:09 AM

## 2012-12-01 NOTE — Progress Notes (Signed)
eLink Physician-Brief Progress Note Patient Name: Frederick [name] DOB: [birth year] MRN: [id number]  Date of Service  12/01/2012   HPI/Events of Note   Breakthrough agitation on fent int alone  eICU Interventions  Use haldol prn Low dose fent gtt ok   Intervention Category Minor Interventions: Agitation / anxiety - evaluation and management  [name] V. 12/01/2012, 9:45 PM

## 2012-12-01 NOTE — Progress Notes (Signed)
ANTICOAGULATION CONSULT NOTE - Follow Up Consult  Pharmacy Consult for warfarin Indication: h/o pulmonary embolism, afib  Allergies  Allergen Reactions  . Hydromorphone Hcl Other (See Comments)    Dilaudid caused Confusion   . Morphine Other (See Comments)    confusion  . Pregabalin Other (See Comments)    lyrica - caused hallucinations    Labs:  Recent Labs  11/29/12 0445 11/30/12 0400 11/30/12 1935 12/01/12 0437  HGB 11.3* 10.0*  --  10.2*  HCT 36.2* 32.0*  --  32.0*  PLT 387 352  --  256  LABPROT 21.4* 20.5*  --  13.5  INR 1.92* 1.82*  --  1.05  CREATININE 1.41* 1.14 0.97 2.02*    Assessment: 79 y/o M recently discharged from [facility] 8/16 for sepsis secondary to Pseudomonas UTI, presented 8/21 with progressive SOB, productive cough. Patient with h/o afib and PE, on chronic warfarin. Patient was reportedly taking warfarin 5mg  po daily with last dose 8/21.  On ventilator since 8/24 with possible aspiration pneumonia, now receiving medications via orogastric tube.  Inpatient warfarin doses administered 8/22 - 8/26:  5mg , 4mg , 4mg , 4mg , 6mg   INR (1.05) still trending down  H/H and pltc acceptable, no bleeding reported.  Off ciprofloxacin (since 8/23) and on tube feeding - may therefore require slightly larger warfarin doses.  Goal of Therapy:  INR 2-3 Monitor platelets by anticoagulation protocol: Yes   Plan:   Coumadin 7.5mg  PO tonight, INR trending down (large decrease overnight), my suspicion is that the TF may be reducing warfarin's effects.   Daily PT/INR  Pharmacy will f/u daily.  Consider adding lovenox until INR trends back up  Juliette Alcide, PharmD, BCPS.   Pager: [phone] 12/01/2012  9:25 AM   .

## 2012-12-01 NOTE — Progress Notes (Signed)
ANTIBIOTIC CONSULT NOTE   Pharmacy Consult for vancomycin/Zosyn Indication: HCAP vs aspiration PNA  Allergies  Allergen Reactions  . Hydromorphone Hcl Other (See Comments)    Dilaudid caused Confusion   . Morphine Other (See Comments)    confusion  . Pregabalin Other (See Comments)    lyrica - caused hallucinations    Patient Measurements: Height: 5\' 8"  (172.7 cm) Weight: 220 lb 14.4 oz (100.2 kg) IBW/kg (Calculated) : 68.4  Vital Signs: Temp: 97.6 F (36.4 C) (08/27 0800) Temp src: Axillary (08/27 0800) BP: 125/83 mmHg (08/27 0500) Pulse Rate: 96 (08/27 0635) Intake/Output from previous day: 08/26 0701 - 08/27 0700 In: 2119.1 [I.V.:1159.1; NG/GT:560; IV Piggyback:400] Out: 1895 [Urine:1895] Intake/Output from this shift:    Labs:  Recent Labs  11/29/12 0445 11/30/12 0400 11/30/12 1935 12/01/12 0437  WBC 13.6* 11.4*  --  12.6*  HGB 11.3* 10.0*  --  10.2*  PLT 387 352  --  256  CREATININE 1.41* 1.14 0.97 2.02*   Estimated Creatinine Clearance: 32.9 ml/min (by C-G formula based on Cr of 2.02). No results found for this basename: VANCOTROUGH, Leodis Binet, VANCORANDOM, GENTTROUGH, GENTPEAK, GENTRANDOM, TOBRATROUGH, TOBRAPEAK, TOBRARND, AMIKACINPEAK, AMIKACINTROU, AMIKACIN,  in the last 72 hours   Microbiology: Recent Results (from the past 720 hour(s))  URINE CULTURE     Status: None   Collection Time    11/15/12  8:17 PM      Result Value Range Status   Specimen Description URINE, RANDOM   Final   Special Requests NONE   Final   Culture  Setup Time     Final   Value: 11/16/2012 06:12     Performed at [facility] Count     Final   Value: >=100,000 COLONIES/ML     Performed at [facility]   Culture     Final   Value: PSEUDOMONAS AERUGINOSA     Performed at [facility]   Report Status 11/17/2012 FINAL   Final   Organism ID, Bacteria PSEUDOMONAS AERUGINOSA   Final  CULTURE, BLOOD (ROUTINE X 2)     Status: None   Collection Time    11/15/12  8:28 PM      Result Value Range Status   Specimen Description BLOOD RIGHT HAND   Final   Special Requests BOTTLES DRAWN AEROBIC AND ANAEROBIC   Final   Culture  Setup Time     Final   Value: 11/16/2012 06:10     Performed at [facility]   Culture     Final   Value: NO GROWTH 5 DAYS     Performed at [facility]   Report Status 11/22/2012 FINAL   Final  CULTURE, BLOOD (ROUTINE X 2)     Status: None   Collection Time    11/15/12  8:44 PM      Result Value Range Status   Specimen Description BLOOD LEFT HAND   Final   Special Requests BOTTLES DRAWN AEROBIC AND ANAEROBIC   Final   Culture  Setup Time     Final   Value: 11/16/2012 06:11     Performed at [facility]   Culture     Final   Value: NO GROWTH 5 DAYS     Performed at [facility]   Report Status 11/22/2012 FINAL   Final  MRSA PCR SCREENING     Status: Abnormal   Collection Time    11/16/12 12:11 AM  Result Value Range Status   MRSA by PCR POSITIVE (*) NEGATIVE Final   Comment:            The GeneXpert MRSA Assay (FDA     approved for NASAL specimens     only), is one component of a     comprehensive MRSA colonization     surveillance program. It is not     intended to diagnose MRSA     infection nor to guide or     monitor treatment for     MRSA infections.     RESULT CALLED TO, READ BACK BY AND VERIFIED WITH:     STOCKS,M/2W @0252  ON 11/16/12 BY KARCZEWSKI,S.  MRSA PCR SCREENING     Status: Abnormal   Collection Time    11/26/12  4:51 AM      Result Value Range Status   MRSA by PCR POSITIVE (*) NEGATIVE Final   Comment:            The GeneXpert MRSA Assay (FDA     approved for NASAL specimens     only), is one component of a     comprehensive MRSA colonization     surveillance program. It is not     intended to diagnose MRSA     infection nor to guide or     monitor treatment for     MRSA infections.     RESULT CALLED TO, READ BACK  BY AND VERIFIED WITH:     A. MOORE RN AT 1610 ON 08.22.14 BY SHUEA  URINE CULTURE     Status: None   Collection Time    11/28/12 10:40 AM      Result Value Range Status   Specimen Description URINE, CATHETERIZED   Final   Special Requests NONE   Final   Culture  Setup Time     Final   Value: 11/28/2012 15:47     Performed at [facility] Count     Final   Value: NO GROWTH     Performed at [facility]   Culture     Final   Value: NO GROWTH     Performed at [facility]   Report Status 11/29/2012 FINAL   Final  CULTURE, BLOOD (ROUTINE X 2)     Status: None   Collection Time    11/28/12 11:00 AM      Result Value Range Status   Specimen Description BLOOD LEFT ARM   Final   Special Requests BOTTLES DRAWN AEROBIC AND ANAEROBIC 6 CC EA   Final   Culture  Setup Time     Final   Value: 11/28/2012 19:51     Performed at [facility]   Culture     Final   Value:        BLOOD CULTURE RECEIVED NO GROWTH TO DATE CULTURE WILL BE HELD FOR 5 DAYS BEFORE ISSUING A FINAL NEGATIVE REPORT     Performed at [facility]   Report Status PENDING   Incomplete  CULTURE, BLOOD (ROUTINE X 2)     Status: None   Collection Time    11/28/12 11:11 AM      Result Value Range Status   Specimen Description BLOOD LEFT ARM   Final   Special Requests BOTTLES DRAWN AEROBIC AND ANAEROBIC 6 CC EA   Final   Culture  Setup Time     Final   Value: 11/28/2012 19:51  Performed at [facility]     Final   Value:        BLOOD CULTURE RECEIVED NO GROWTH TO DATE CULTURE WILL BE HELD FOR 5 DAYS BEFORE ISSUING A FINAL NEGATIVE REPORT     Performed at [facility]   Report Status PENDING   Incomplete  CULTURE, RESPIRATORY (NON-EXPECTORATED)     Status: None   Collection Time    11/28/12 11:35 AM      Result Value Range Status   Specimen Description TRACHEAL ASPIRATE   Final   Special Requests NONE   Final   Gram Stain     Final   Value: FEW  WBC PRESENT, PREDOMINANTLY PMN     RARE SQUAMOUS EPITHELIAL CELLS PRESENT     RARE GRAM POSITIVE COCCI IN PAIRS     Performed at [facility]   Culture     Final   Value: FEW CANDIDA ALBICANS     Performed at [facility]   Report Status 12/01/2012 FINAL   Final    Medical History: Past Medical History  Diagnosis Date  . Diabetes mellitus   . Hypertension   . Hypercholesterolemia   . Esophageal stricture   . Pulmonary embolism 2008    chronic coumadin   . Coronary artery disease     DR. BERRY IS PT'S CARDIOLOGIST  . Atrial fibrillation     CHRONIC COUMADIN-pemanent atrial fib  . Anxiety     SINCE 1975   . Depression     SINCE 1975    . Shortness of breath     NOT SURE WHAT CAUSES SOB - BUT HE IS NOT ABLE TO BE ACTIVE - AND IS SOB WITH ANY ACTIVITY - USES OXYGEN 2 L NASAL CANNULA - ALL THE TIME -EXCEPT WHEN IN SHOWER OR CHANGING CLOTHES on home 02  . Peripheral vascular disease     TOLD SOME SMALL AMOUNT OF BLOCKAGE IN LEG WHEN HEART CATH DONE 2008  . Bladder tumor     HAS HAD HEMATURIA OFF AND ON - HAS FOLEY CATH THAT WAS PLACED JUNE 20TH, 2014  . GERD (gastroesophageal reflux disease)   . Edema     FEET AND LEGS MOST DAYS - SOMETIMES WEARS COMPRESSION HOSE  . Cancer     SKIN CANCERS  . Arthritis     S/P BILATERAL TOTAL KNEE REPLACEMENTS - BUT BOTH KNEES PAINFUL AND JOINTS WORN OUT; BAD RIGHT HIP - BUT NOT A CANDIDATE FOR HIP PREPLACMENT;  HAS SEVERE LOWER  BACK AND LEG PAIN - HAS SPINAL STENOSIS AND BULGING DISC; CURVATURE OF UPPER SPINE - UNABLE TO LAY HIS HEAD FLAT.  Marland Kitchen Pneumonia 2005  . Difficult intubation     DUE TO LIMITED NECK FLEXION AND SHORT NECK--ANESTHESIA RECORD FROM 2007 VATS SURGERY AT [facility] OBTAINED AND ON PT'S CHART.  Marland Kitchen Problems with hearing     WEARS BILATERAL HEARING AIDS    Medications:  Scheduled:  . antiseptic oral rinse  15 mL Mouth Rinse QID  . chlordiazePOXIDE  10 mg Oral TID  . chlorhexidine  15 mL Mouth Rinse BID  .  DULoxetine  60 mg Oral QHS  . feeding supplement  30 mL Per Tube Q24H  . feeding supplement  60 mL Per Tube TID  . feeding supplement (VITAL AF 1.2 CAL)  1,000 mL Per Tube Q24H  . free water  150 mL Per Tube Q6H  . insulin aspart  2-6 Units Subcutaneous  Q4H  . irbesartan  150 mg Oral Q breakfast  . isosorbide dinitrate  15 mg Per Tube BID  . multivitamin  5 mL Per Tube Daily  . pantoprazole (PROTONIX) IV  40 mg Intravenous QHS  . piperacillin-tazobactam (ZOSYN)  IV  3.375 g Intravenous Q8H  . senna-docusate  1 tablet Oral BID  . warfarin  7.5 mg Per Tube ONCE-1800  . Warfarin - Pharmacist Dosing Inpatient   Does not apply q1800   Infusions:  . sodium chloride 1,000 mL (11/30/12 0352)  . propofol 5 mcg/kg/min (11/30/12 1800)   Assessment: 79 yo male admitted with resp failure just discharged recently on 8/16 after an admission for pseudomonas UTI now to treat HCAP vs aspiration PNA with vancomycin and Zosyn  8/23 >> cipro >> 8/24 8/24 >> vancomycin >> 8/24 >> Zosyn >>   Tmax: afebrile WBCs: improved to 12.6 Renal: SCr bumped to 2.02 (from 0.97). CrCl = 31ml/min (C-G) and 29.85ml/min (N)  8/24: Blood x 2: NGtd  8/24: Urine: NGF 8/24: Sputum: few Candida albicans   Goal of Therapy:  Vancomycin trough level 15-20 mcg/ml  Plan:  Day #4 vancomycin/zosyn  Based on jump in SCr will stop vancomycin and await antibiotic plans per CCM, if wants to continue will check level today  Zosyn dose remains appropriate for renal function at this time.    Juliette Alcide, PharmD, BCPS.   Pager: [phone]  12/01/2012 9:46 AM

## 2012-12-02 ENCOUNTER — Inpatient Hospital Stay (HOSPITAL_COMMUNITY): Payer: Medicare Other

## 2012-12-02 LAB — CLOSTRIDIUM DIFFICILE BY PCR: Toxigenic C. Difficile by PCR: NEGATIVE

## 2012-12-02 LAB — CK: Total CK: 39 U/L (ref 7–232)

## 2012-12-02 LAB — GLUCOSE, CAPILLARY
Glucose-Capillary: 167 mg/dL — ABNORMAL HIGH (ref 70–99)
Glucose-Capillary: 185 mg/dL — ABNORMAL HIGH (ref 70–99)

## 2012-12-02 LAB — BASIC METABOLIC PANEL
CO2: 31 mEq/L (ref 19–32)
GFR calc non Af Amer: 68 mL/min — ABNORMAL LOW (ref >90)
Glucose, Bld: 178 mg/dL — ABNORMAL HIGH (ref 70–99)
Potassium: 2.9 mEq/L — ABNORMAL LOW (ref 3.5–5.1)
Sodium: 143 mEq/L (ref 135–145)

## 2012-12-02 LAB — PRO B NATRIURETIC PEPTIDE: Pro B Natriuretic peptide (BNP): 1778 pg/mL — ABNORMAL HIGH (ref 0–450)

## 2012-12-02 LAB — PROCALCITONIN: Procalcitonin: <0.1 ng/mL

## 2012-12-02 LAB — MAGNESIUM: Magnesium: 2 mg/dL (ref 1.5–2.5)

## 2012-12-02 MED ORDER — CHLORHEXIDINE GLUCONATE CLOTH 2 % EX PADS
6.0000 | MEDICATED_PAD | Freq: Every day | CUTANEOUS | Status: DC
  Administered 2012-12-02: 6 via TOPICAL

## 2012-12-02 MED ORDER — MUPIROCIN 2 % EX OINT
1.0000 "application " | TOPICAL_OINTMENT | Freq: Two times a day (BID) | CUTANEOUS | Status: AC
  Administered 2012-12-02 – 2012-12-06 (×10): 1 via NASAL
  Filled 2012-12-02 (×2): qty 22

## 2012-12-02 MED ORDER — BIOTENE DRY MOUTH MT LIQD
15.0000 mL | Freq: Two times a day (BID) | OROMUCOSAL | Status: DC
  Administered 2012-12-03 – 2012-12-07 (×8): 15 mL via OROMUCOSAL

## 2012-12-02 MED ORDER — CHLORHEXIDINE GLUCONATE CLOTH 2 % EX PADS
6.0000 | MEDICATED_PAD | Freq: Every day | CUTANEOUS | Status: AC
  Administered 2012-12-03 – 2012-12-06 (×4): 6 via TOPICAL

## 2012-12-02 MED ORDER — POTASSIUM CHLORIDE 10 MEQ/50ML IV SOLN
10.0000 meq | INTRAVENOUS | Status: AC
  Administered 2012-12-02 (×6): 10 meq via INTRAVENOUS
  Filled 2012-12-02 (×6): qty 50

## 2012-12-02 NOTE — Procedures (Signed)
Extubation Procedure Note  Patient Details:   Name: Frederick [name] DOB: 05/21/1929 MRN: [id number]   Airway Documentation:  Airway 7.5 mm (Active)  Secured at (cm) 24 cm 12/02/2012  9:21 AM  Measured From Lips 12/02/2012  9:21 AM  Secured Location Left 12/02/2012  4:11 AM  Secured By [employer] 12/02/2012  9:21 AM  Tube Holder Repositioned Yes 12/02/2012  9:21 AM  Cuff Pressure (cm H2O) 24 cm H2O 12/02/2012  9:21 AM    Evaluation  O2 sats: stable throughout Complications: No apparent complications Patient did tolerate procedure well. Bilateral Breath Sounds: Clear;Diminished Suctioning: Oral;Airway Yes  Dairl Ponder Nannette 12/02/2012, 11:14 AM

## 2012-12-02 NOTE — Progress Notes (Signed)
ANTICOAGULATION CONSULT NOTE - Follow Up Consult  Pharmacy Consult for warfarin Indication: h/o pulmonary embolism, afib  Allergies  Allergen Reactions  . Hydromorphone Hcl Other (See Comments)    Dilaudid caused Confusion   . Morphine Other (See Comments)    confusion  . Pregabalin Other (See Comments)    lyrica - caused hallucinations    Labs:  Recent Labs  11/30/12 0400 11/30/12 1935 12/01/12 0437 12/01/12 1200 12/02/12 0330  HGB 10.0*  --  10.2*  --   --   HCT 32.0*  --  32.0*  --   --   PLT 352  --  256  --   --   LABPROT 20.5*  --  13.5  --  24.5*  INR 1.82*  --  1.05  --  2.29*  CREATININE 1.14 0.97 2.02*  --   --   CKTOTAL  --   --   --  44 39  CKMB  --   --   --  3.6  --     Assessment: 79 y/o M recently discharged from [facility] 8/16 for sepsis secondary to Pseudomonas UTI, presented 8/21 with progressive SOB, productive cough. Patient with h/o afib and PE, on chronic warfarin. Patient was reportedly taking warfarin 5mg  po daily with last dose 8/21.  On ventilator since 8/24 with possible aspiration pneumonia, now receiving medications via orogastric tube.  Inpatient warfarin doses administered 8/22 - 8/27:  5mg , 4mg , 4mg , 4mg , 6mg , 7.5mg   INR today is 2.29 - large rise (unexpected based on previous dose/INR response on TF)  H/H and pltc acceptable, no bleeding reported.  Off ciprofloxacin (since 8/23) and on tube feeding - may therefore require slightly larger warfarin doses.  Goal of Therapy:  INR 2-3 Monitor platelets by anticoagulation protocol: Yes   Plan:   Based on INR rate of rise will not give dose of warfarin tonight, surprised by jump based on INR trend downward previous days.  Suspicion was that TF was interacting causing higher dosing.   D/C SQ heparin  Daily PT/INR  Pharmacy will f/u daily.  Juliette Alcide, PharmD, BCPS.   Pager: [phone] 12/02/2012  8:12 AM   .

## 2012-12-02 NOTE — Progress Notes (Signed)
PULMONARY  / CRITICAL CARE MEDICINE  Name: Frederick [name] MRN: [id number] DOB: 1929/11/28    ADMISSION DATE:  11/25/2012 CONSULTATION DATE:  11/28/2012  REFERRING MD :  [facility] PRIMARY SERVICE: TRH-->PCCM CHIEF COMPLAINT:  Respiratory failure  BRIEF PATIENT DESCRIPTION: 79 year old male with history of advance dementia (apparently NOT SO per RN and wife 12/01/12 - lived at home and performed ADLs) but lives at SNF presenting to the hospital with respiratory failure and fluid overload.  Also significant history of dysphagia and ?aspiration.  On 8/24 the patient had significant AMS, very somnolent and barely arousable.  ABG revealed hypercarbic and hypoxic respiratory failure.   has a past medical history of Diabetes mellitus; Hypertension; Hypercholesterolemia; Esophageal stricture; Pulmonary embolism (2008); Coronary artery disease; Atrial fibrillation; Anxiety; Depression; Shortness of breath; Peripheral vascular disease; Bladder tumor; GERD (gastroesophageal reflux disease); Edema; Cancer; Arthritis; Pneumonia (2005); Difficult intubation; and Problems with hearing.   has past surgical history that includes Shoulder surgery; Pancreas surgery (2001); Total knee arthroplasty; Lung surgery (2007); Basil cell; Cardiac catheterization (2008); ESOPHAGEAL STRETCH; SKIN CANCER REMOVED FROM LEFT SIDE OF HEAD - REQUIRED SKIN GRAFT FROM LEFT LEG; CARTILAGE REPAIR 198- LEFT KNEE; Joint replacement; Transurethral resection of bladder tumor with gyrus (turbt-gyrus) (N/A, 11/03/2012); Cystoscopy/retrograde/ureteroscopy (11/03/2012); and Cystogram (N/A, 11/03/2012).    LINES / TUBES: ET Tube 8/24>>> OGT 8/24>>>  RIJ TLC 8/24>>> R radial a-line 8/24>>> 11/30/12  CULTURES: 11/26/12- MRSA PCR _ POSITIVE Blood 8/24>>> Urine 8/24>>> Sputum 8/24>>>      ANTIBIOTICS: Vanc 8/24>>>8/27 (rising creat) Zosyn 8/24>>>  SIGNIFICANT EVENTS / STUDIES:  8/24 - VDRF requiring intubation. 12/01/12 - learning from  nursting staff that he DOES NTO HAVE dementia  12/01/12: Off diprivan: RASS 0. Suddent jump in creatinine today  - doubling. Failed SBT - set off apnea alarms. Otherwsie stable. Wife at bedside. RNs say no dementia    SUBJECTIVE/OVERNIGHT/INTERVAL HX 12/02/12 - overnight agiation needing haldol and low dose fent gtt. This aM off sedation and doing SBT   VITAL SIGNS: Temp:  [98.8 F (37.1 C)-99.9 F (37.7 C)] 99.1 F (37.3 C) (08/28 0400) Pulse Rate:  [79-96] 86 (08/28 0900) Resp:  [14-22] 15 (08/28 0900) BP: (103-158)/(63-92) 139/76 mmHg (08/28 0900) SpO2:  [99 %-100 %] 100 % (08/28 0900) FiO2 (%):  [0.3 %-30 %] 0.3 % (08/28 0921) Weight:  [102.5 kg (225 lb 15.5 oz)] 102.5 kg (225 lb 15.5 oz) (08/28 0411)  HEMODYNAMICS: CVP:  [11 mmHg-13 mmHg] 11 mmHg  VENTILATOR SETTINGS: Vent Mode:  [-] CPAP;PSV FiO2 (%):  [0.3 %-30 %] 0.3 % Set Rate:  [14 bmp] 14 bmp Vt Set:  [450 mL] 450 mL PEEP:  [5 cmH20] 5 cmH20 Pressure Support:  [10 cmH20] 10 cmH20 Plateau Pressure:  [21 cmH20-23 cmH20] 22 cmH20  INTAKE / OUTPUT: Intake/Output     08/27 0701 - 08/28 0700 08/28 0701 - 08/29 0700   I.V. (mL/kg) 887.3 (8.7) 22 (0.2)   NG/GT 610 20   IV Piggyback 150    Total Intake(mL/kg) 1647.3 (16.1) 42 (0.4)   Urine (mL/kg/hr) 1625 (0.7) 85 (0.3)   Total Output 1625 85   Net +22.3 -43        Stool Occurrence 6 x      Total I/O In: 42 [I.V.:22; NG/GT:20] Out: 85 [Urine:85]  PHYSICAL EXAMINATION: General:  Chronically ill appearing elderly male, NAD. Neuro:  Awake / alert, nods, moving ext's HEENT:  [place]/AT, PERRL, EOM-I, OETT Cardiovascular:  RRR, Nl S1/S2, -M/R/G. Lungs:  Coarse  BS diffusely L>R. Abdomen:  Soft, NT, ND and +BS. Musculoskeletal:  -edema and -tenderness. Skin:  Intact.  LABS: PULMONARY  Recent Labs Lab 11/28/12 0842 11/28/12 1145 11/29/12 0430 11/29/12 0627  PHART 7.360 7.582* 7.598* 7.539*  PCO2ART 80.3* 41.0 31.7* 39.9  PO2ART 63.6* 428.0* 99.0 95.3  HCO3  44.3* 38.7* 31.2* 33.9*  TCO2 39.8 33.2 27.4 30.1  O2SAT 89.7 99.2 97.2 96.8    CBC  Recent Labs Lab 11/29/12 0445 11/30/12 0400 12/01/12 0437  HGB 11.3* 10.0* 10.2*  HCT 36.2* 32.0* 32.0*  WBC 13.6* 11.4* 12.6*  PLT 387 352 256    COAGULATION  Recent Labs Lab 11/28/12 0506 11/29/12 0445 11/30/12 0400 12/01/12 0437 12/02/12 0330  INR 2.62* 1.92* 1.82* 1.05 2.29*    CARDIAC    Recent Labs Lab 11/26/12 0218 11/26/12 0810 11/26/12 1349  TROPONINI <0.30 <0.30 <0.30    Recent Labs Lab 11/27/12 0538 12/02/12 0330  PROBNP 1393.0* 1778.0*     CHEMISTRY  Recent Labs Lab 11/28/12 0506 11/28/12 0931 11/29/12 0445 11/30/12 0400 11/30/12 1935 12/01/12 0437 12/02/12 0330  NA 140 142 145 140 142 143  --   K 3.3* 4.1 3.3* 2.8* 3.2* 3.8  --   CL 90* 91* 100 98 104 102  --   CO2 43* 43* 35* 34* 33* 30  --   GLUCOSE 131* 205* 192* 258* 261* 121*  --   BUN 41* 43* 41* 43* 37* 62*  --   CREATININE 1.45* 1.46* 1.41* 1.14 0.97 2.02*  --   CALCIUM 9.8 9.7 8.9 8.7 8.6 9.7  --   MG 1.9  --  1.8 1.9  --  2.1 2.0  PHOS  --   --  2.0* 3.1  --  3.6 2.4   Estimated Creatinine Clearance: 33.3 ml/min (by C-G formula based on Cr of 2.02).   LIVER  Recent Labs Lab 11/28/12 0506 11/29/12 0445 11/30/12 0400 12/01/12 0437 12/02/12 0330  INR 2.62* 1.92* 1.82* 1.05 2.29*     INFECTIOUS  Recent Labs Lab 11/28/12 0931  11/30/12 0400 12/01/12 1200 12/02/12 0330  LATICACIDVEN 1.6  --   --  1.4 1.1  PROCALCITON <0.10  < > <0.10 <0.10 <0.10  < > = values in this interval not displayed.   ENDOCRINE CBG (last 3)   Recent Labs  12/01/12 1945 12/01/12 2332 12/02/12 0419  GLUCAP 196* 153* 191*         IMAGING x48h  Dg Chest Port 1 View  12/02/2012   *RADIOLOGY REPORT*  Clinical Data: Endotracheal tube position.  PORTABLE CHEST - 1 VIEW  Comparison: December 01, 2012.  Findings: Endotracheal tube is in grossly good position with distal tip 2 cm above  the carina.  Stable cardiomegaly is noted.  No change is noted in position of right internal jugular catheter. Mild central pulmonary vascular congestion is again noted.  Left basilar opacity is again noted and unchanged concerning for atelectasis and possible associated pleural effusion.  IMPRESSION: Endotracheal tube in grossly good position.  Stable left basilar opacity.   Original Report Authenticated By: Lupita Raider.,  M.D.   Dg Chest Port 1 View  12/01/2012   *RADIOLOGY REPORT*  Clinical Data: Evaluate endotracheal tube  PORTABLE CHEST - 1 VIEW  Comparison: Prior chest x-ray 11/27/2012  Findings: The tip of the endotracheal tube is 1.7 cm above the carina.  Stable position of right IJ central venous catheter with the tip at the superior cavoatrial junction.  The tip  of the nasogastric tube projects over the stomach. Slightly improved left lower lobe collapse.  Persistent bilateral layering effusions and associated bibasilar opacities.  Multiple surgical clips project over the left upper quadrant and mid epigastric region. Incompletely imaged left double-J ureteral stent.  Stable cardiomegaly and aortic atherosclerosis.  IMPRESSION:  1.  Slightly improved left lower lobe atelectasis/collapse. 2.  Persistent left greater than right layering pleural effusions and associated bibasilar atelectasis versus infiltrate 3.  Stable support apparatus.  The tip of the endotracheal tube is 1.7 cm above the carina.   Original Report Authenticated By: Malachy Moan, M.D.      Imaging Dg Chest Port 1 View  12/02/2012   *RADIOLOGY REPORT*  Clinical Data: Endotracheal tube position.  PORTABLE CHEST - 1 VIEW  Comparison: December 01, 2012.  Findings: Endotracheal tube is in grossly good position with distal tip 2 cm above the carina.  Stable cardiomegaly is noted.  No change is noted in position of right internal jugular catheter. Mild central pulmonary vascular congestion is again noted.  Left basilar opacity is  again noted and unchanged concerning for atelectasis and possible associated pleural effusion.  IMPRESSION: Endotracheal tube in grossly good position.  Stable left basilar opacity.   Original Report Authenticated By: Lupita Raider.,  M.D.   Dg Chest Port 1 View  12/01/2012   *RADIOLOGY REPORT*  Clinical Data: Evaluate endotracheal tube  PORTABLE CHEST - 1 VIEW  Comparison: Prior chest x-ray 11/27/2012  Findings: The tip of the endotracheal tube is 1.7 cm above the carina.  Stable position of right IJ central venous catheter with the tip at the superior cavoatrial junction.  The tip of the nasogastric tube projects over the stomach. Slightly improved left lower lobe collapse.  Persistent bilateral layering effusions and associated bibasilar opacities.  Multiple surgical clips project over the left upper quadrant and mid epigastric region. Incompletely imaged left double-J ureteral stent.  Stable cardiomegaly and aortic atherosclerosis.  IMPRESSION:  1.  Slightly improved left lower lobe atelectasis/collapse. 2.  Persistent left greater than right layering pleural effusions and associated bibasilar atelectasis versus infiltrate 3.  Stable support apparatus.  The tip of the endotracheal tube is 1.7 cm above the carina.   Original Report Authenticated By: Malachy Moan, M.D.       ASSESSMENT / PLAN:  PULMONARY A:  VDRF due to ?aspiration PNA resulting in hypercarbic respiratory failure; consider also influence of poor abdominal compliance (retained contrast on abd CT scan, hiatal hernia) as CT abd appears to show LLL scarring Hx of PE - in 2008, on coumadin Difficult Intubation - due to limited neck flexion  12/01/12: FAiled SBT - triggered apnea alarms. Possibly due to overhang of mutliple CNS meds in face of worsening creat  12/02/12: DOing well on SBT  P:   - extubate if meets criteria  CARDIOVASCULAR A:  Hypotension post intubation - resolved / soft but normotensive AFib - on  coumadin Hx HTN HLD PVD  12/01/12: INR going down despite coumadin in face of tube feeds. Creat up  P:  - - continue to hold off Avpro ARB - reduced imdur dosing while intubated - coumadin per pharmacy; sq heparin till INR therapeutic  RENAL A:   AKI - Worsening Cr on admit appears pre-renal.  Resolved.  Hx of L double J stenting  Hypokalemia Hypophosphatemia   12/01/12: Doubling in creatinine. AKIN STAGE 2. RIFLE - INJURY. MAKING URINE. GFR 32. Normal CK and lactic - no evidence of propofol infusion  syndrome  12/02/12 - creat pending    P:   -avoid VANC -avoid ARB  -avoid\VERSED - use half DLOXETINE HOME  DOSE Use half librium HOME DOSE  - dc zosyn   GASTROINTESTINAL A:   Dysphagia - in setting of intubation, GERD P:   - OGT. - hold TF - SLP when  extubated   HEMATOLOGIC A:  No active issues. P:  - Daily CBC.  INFECTIOUS A:  ?Asp PNA. Note Pct negative P:   - Pan culture. - DC VANC - dc zosyn  ENDOCRINE A:  DM by history.   P:   - SSI   NEUROLOGIC Baseline: No dementia per wife. [place] VEt. Likely PTSD - on duloextine and librium (latter since 1970s)  A:  Obtunded - due to hypercarbia and likely sepsis in a patient with severe baseline dementia.   12/02/12: RASS 0. CAM-ICU neg for deliruim  P:   - Intermittent sedation protocol with fentanyl - diprivan can be restarted if neeed but aim to extubate - avoid versed -half dose of cymbalta and librium - home meds  TODAY'S SUMMARY:   11/30/12: Updated wife at bedside, patient looks better 8/26 in regards to extubation.  Explained extensively that this will be a day to day review of patients status.  Per family, he would be ok with short term intubation but not trach/peg, ok with LCB with no CPR and no cardioversion.  Discussions have been that we will get him to the best point possible with extubation and no plans to reintubate. Wife is still contemplating this and may not be completely comfortable  with withdrawal if he fails extubation. We agreed to discuss further.   11/11/12: Wife and niece updated. Keep intubated  12/02/12: wife and niece updated. Ok to reinbuate x few more times. No CPR. Wife not ready to give up   The patient is critically ill with multiple organ systems failure and requires high complexity decision making for assessment and support, frequent evaluation and titration of therapies, application of advanced monitoring technologies and extensive interpretation of multiple databases.   Critical Care Time devoted to patient care services described in this note is  35  Minutes.  Dr. Kalman Shan, M.D., [facility].C.P Pulmonary and Critical Care Medicine Staff Physician [facility] [place] [facility] Pager: [phone], If no answer or between  15:00h - 7:00h: call [phone]  12/02/2012 10:08 AM

## 2012-12-02 NOTE — Evaluation (Signed)
Clinical/Bedside Swallow Evaluation Patient Details  Name: Frederick [name] MRN: [id number] Date of Birth: 1929-08-08  Today's Date: 12/02/2012 Time: 7829-5621 SLP Time Calculation (min): 27 min  Past Medical History:  Past Medical History  Diagnosis Date  . Diabetes mellitus   . Hypertension   . Hypercholesterolemia   . Esophageal stricture   . Pulmonary embolism 2008    chronic coumadin   . Coronary artery disease     DR. BERRY IS PT'S CARDIOLOGIST  . Atrial fibrillation     CHRONIC COUMADIN-pemanent atrial fib  . Anxiety     SINCE 1975   . Depression     SINCE 1975    . Shortness of breath     NOT SURE WHAT CAUSES SOB - BUT HE IS NOT ABLE TO BE ACTIVE - AND IS SOB WITH ANY ACTIVITY - USES OXYGEN 2 L NASAL CANNULA - ALL THE TIME -EXCEPT WHEN IN SHOWER OR CHANGING CLOTHES on home 02  . Peripheral vascular disease     TOLD SOME SMALL AMOUNT OF BLOCKAGE IN LEG WHEN HEART CATH DONE 2008  . Bladder tumor     HAS HAD HEMATURIA OFF AND ON - HAS FOLEY CATH THAT WAS PLACED JUNE 20TH, 2014  . GERD (gastroesophageal reflux disease)   . Edema     FEET AND LEGS MOST DAYS - SOMETIMES WEARS COMPRESSION HOSE  . Cancer     SKIN CANCERS  . Arthritis     S/P BILATERAL TOTAL KNEE REPLACEMENTS - BUT BOTH KNEES PAINFUL AND JOINTS WORN OUT; BAD RIGHT HIP - BUT NOT A CANDIDATE FOR HIP PREPLACMENT;  HAS SEVERE LOWER  BACK AND LEG PAIN - HAS SPINAL STENOSIS AND BULGING DISC; CURVATURE OF UPPER SPINE - UNABLE TO LAY HIS HEAD FLAT.  Marland Kitchen Pneumonia 2005  . Difficult intubation     DUE TO LIMITED NECK FLEXION AND SHORT NECK--ANESTHESIA RECORD FROM 2007 VATS SURGERY AT [facility] OBTAINED AND ON PT'S CHART.  Marland Kitchen Problems with hearing     WEARS BILATERAL HEARING AIDS   Past Surgical History:  Past Surgical History  Procedure Laterality Date  . Shoulder surgery      bilateral  . Pancreas surgery  2001    TUMOR REMOVED FROM PANCREAS AND SPLEEN ALSO REMOVED  . Total knee arthroplasty      bilateral  . Lung  surgery  2007    non-cancer  VIDEO ASSISTED THORCOTOMY TO REMOVE FLUID / DECORTICATION. DR. Edwyna Shell  . Basil cell    . Cardiac catheterization  2008    totally occluded nondominant LCX wth mod. ostial RCA disease and nl. EF  . Esophageal stretch    . Skin cancer removed from left side of head - required skin graft from left leg    . Cartilage repair 198- left knee    . Joint replacement    . Transurethral resection of bladder tumor with gyrus (turbt-gyrus) N/A 11/03/2012    Procedure: TRANSURETHRAL RESECTION OF BLADDER TUMOR WITH GYRUS (TURBT-GYRUS);  Surgeon: Milford Cage, MD;  Location: [facility];  Service: Urology;  Laterality: N/A;  . Cystoscopy/retrograde/ureteroscopy  11/03/2012    Procedure: CYSTOSCOPY/RETROGRADE/ LEFT URETEROSCOPY AND STENT PLACEMENT;  Surgeon: Milford Cage, MD;  Location: [facility];  Service: Urology;;  . Theador Hawthorne N/A 11/03/2012    Procedure: CYSTOGRAM;  Surgeon: Milford Cage, MD;  Location: [facility];  Service: Urology;  Laterality: N/A;   HPI:  79 yo male adm to [facility] 8/21 with AMS, found to have pna.  PMH + for CAD, respiratory problems on oxygen at home 2 liters, GERD, esophageal strictures and dilatations, hx of food impactions.  Initial bedside swallow eval 8/22 revealed s/s of diffuse dysphagia.  Wife reported pt with poor intake, oral holding, and increased coughing with and without po intake PTA.  MBS 8/23 revealed moderate oral phase dysphagia; severe pharyngeal phase dysphagia; severe cervical esophageal phase dysphagia; suspected primary esophageal dysphagia.  (Diffuse residue throughout pharynx post-swallow; backflow from cervical esophagus; and poor airway protection with liquids.)  Pt started on dysphagia 1 diet with nectar-thick liquids and strict precautions. On 8/24, developed acute respiratory failure with hypoxia, hypercarbia, likely multifactorial secondary to pulmonary vascular congestion, questionable aspiration PNA.  Intubated 8/24-8/28.      Assessment / Plan / Recommendation Clinical Impression  Pt presents with difficult situation with chronic esophageal dysphagia, severe pharyngeal dysphagia per MBS 8/23, possible aspiration event requiring intubation, leading to a further debilitated swallow s/p extubation.  Limited exam today reveals poor PO toleration, such that even limited meds crushed in puree would be overtly aspirated.  Wife present.  Provided basic overview of current swallowing difficulties and limited means of overcoming them.  Pt's wife having difficulty understanding discussion.  Provided support.  SLP will f/u next date for improved readiness.      Aspiration Risk  Severe    Diet Recommendation NPO          Follow Up Recommendations   (tba)    Frequency and Duration min 2x/week  1 week    .     SLP Swallow Goals Patient will utilize recommended strategies during swallow to increase swallowing safety with: Maximal cueing   Swallow Study Prior Functional Status       General Date of Onset: 11/26/12 Type of Study: Bedside swallow evaluation Previous Swallow Assessment: BSE 11/26/12; MBS 11/27/12 Diet Prior to this Study: NPO Temperature Spikes Noted: No Respiratory Status: Other (comment) (2 liters [place], 100% Sp O2) History of Recent Intubation: Yes Length of Intubations (days): 4 days Date extubated: 12/02/12 Behavior/Cognition: Alert;Confused Oral Cavity - Dentition: Missing dentition Self-Feeding Abilities: Total assist Patient Positioning: Upright in bed Baseline Vocal Quality: Breathy;Hoarse;Low vocal intensity Volitional Cough: Weak Volitional Swallow: Unable to elicit    Oral/Motor/Sensory Function Overall Oral Motor/Sensory Function: Appears within functional limits for tasks assessed   Ice Chips     Thin Liquid Thin Liquid: Impaired Presentation:  (toothette/sponge) Pharyngeal  Phase Impairments: Suspected delayed Swallow;Decreased hyoid-laryngeal movement;Wet Vocal  Quality;Multiple swallows    Nectar Thick Nectar Thick Liquid: Not tested   Honey Thick Honey Thick Liquid: Not tested   Puree Puree: Impaired Oral Phase Impairments: Reduced lingual movement/coordination;Impaired anterior to posterior transit Oral Phase Functional Implications: Prolonged oral transit Pharyngeal Phase Impairments: Decreased hyoid-laryngeal movement;Multiple swallows;Wet Vocal Quality;Throat Clearing - Immediate;Cough - Immediate   Solid      Solid: Not tested      Seham Gardenhire L. Samson Frederic, [place] CCC/SLP Pager [phone]  Blenda Mounts Laurice 12/02/2012,2:58 PM

## 2012-12-02 NOTE — Progress Notes (Signed)
NUTRITION FOLLOW UP  Intervention:   Diet advancement per MD pending SLP eval Provide Juven BID, Glucerna BID, and Multivitamin with minerals once diet advanced If no improvements seen in swallowing ability/aspiration risk, recommend considering TF placement.   Nutrition Dx: Inadequate oral intake related to inability to eat as evidenced by NPO status; ongoing  Goal: Enteral nutrition to provide 60-70% of estimated calorie needs (22-25 kcals/kg ideal body weight) and 100% of estimated protein needs, based on ASPEN guidelines for permissive underfeeding in critically ill obese individuals; discontinued  New Goal: Pt to meet >/= 90% of their estimated nutrition needs   Monitor:   TF initiation/tolerance; discontinued Vent status; extubated 8/28 Weight; 9 lb wt gain from 8/22 to 8/28 Labs; low potassium, high glucose, low hemoglobin Diet advancement/PO intake  Assessment:   Pt was extubated 8/28 AM.  Prior to intubation SLP evaluated pt and pt was put on a dysphagia 1 diet with nectar thick liquids. Per SLP eval note today, pt should remain NPO due to severe aspiration risk and SLP will follow up tomorrow for improved readiness.  No wt loss since admission. Predict poor PO intake when diet advanced; RD will continue to monitor and provide supplements as needed.   Height: Ht Readings from Last 1 Encounters:  11/26/12 5\' 8"  (1.727 m)    Weight Status:   Wt Readings from Last 1 Encounters:  12/02/12 225 lb 15.5 oz (102.5 kg)    Re-estimated needs:  Kcal: 1950-2150  Protein: 140-150 grams  Fluid: >/= 2.6 L/day  Skin: +1 RLE, LLE, RUE and LUE edema; stage 2 pressure ulcer on sacrum, wound on left leg  Diet Order: NPO   Intake/Output Summary (Last 24 hours) at 12/02/12 1515 Last data filed at 12/02/12 1500  Gross per 24 hour  Intake   1252 ml  Output   1305 ml  Net    -53 ml    Last BM: 8/27   Labs:   Recent Labs Lab 11/30/12 0400 11/30/12 1935 12/01/12 0437  12/02/12 0330 12/02/12 1030  NA 140 142 143  --  143  K 2.8* 3.2* 3.8  --  2.9*  CL 98 104 102  --  107  CO2 34* 33* 30  --  31  BUN 43* 37* 62*  --  28*  CREATININE 1.14 0.97 2.02*  --  1.00  CALCIUM 8.7 8.6 9.7  --  8.9  MG 1.9  --  2.1 2.0  --   PHOS 3.1  --  3.6 2.4  --   GLUCOSE 258* 261* 121*  --  178*    CBG (last 3)   Recent Labs  12/02/12 0419 12/02/12 0759 12/02/12 1226  GLUCAP 191* 172* 167*    Scheduled Meds: . antiseptic oral rinse  15 mL Mouth Rinse QID  . chlordiazePOXIDE  5 mg Oral TID  . chlorhexidine  15 mL Mouth Rinse BID  . Chlorhexidine Gluconate Cloth  6 each Topical Q0600  . DULoxetine  30 mg Oral QHS  . insulin aspart  2-6 Units Subcutaneous Q4H  . isosorbide dinitrate  15 mg Per Tube BID  . multivitamin  5 mL Per Tube Daily  . mupirocin ointment  1 application Nasal BID  . pantoprazole (PROTONIX) IV  40 mg Intravenous QHS  . potassium chloride  10 mEq Intravenous Q1 Hr x 6  . senna-docusate  1 tablet Oral BID  . Warfarin - Pharmacist Dosing Inpatient   Does not apply [phone]  Continuous Infusions: . sodium chloride 500 mL (12/02/12 1412)  . fentaNYL infusion INTRAVENOUS 20 mcg/hr (12/02/12 0600)    Ian Malkin RD, LDN Inpatient Clinical Dietitian Pager: [phone] After Hours Pager: [phone]

## 2012-12-02 NOTE — Progress Notes (Signed)
12/02/12 0945  Clinical Encounter Type  Visited With Patient and family together (wife Corrie Dandy, niece Leta Jungling)  Visit Type Initial;Spiritual support;Social support  Referral From Patient's clergy (Larrie Kass, [place], [facility])  Spiritual Encounters  Spiritual Needs Emotional;Prayer  Stress Factors  Family Stress Factors Major life changes (Discernment about pt's needs and care)   Referred by pt's clergy for spiritual and emotional support.  Wife Corrie Dandy was very welcoming and appreciative of opportunity to share and process her experience of Mr Cowans's history, needs, and questions about care-related decisions for him going forward.  Provided pastoral presence, reflective listening, prayer at bedside, and information about ongoing chaplain availability, particularly if Corrie Dandy would value a conversation partner as she discerns how to make future decisions about her husband's care.  Please page as needed:  [phone].  Thank you!  [address] [place], [place] [phone]

## 2012-12-03 ENCOUNTER — Inpatient Hospital Stay (HOSPITAL_COMMUNITY): Payer: Medicare Other

## 2012-12-03 LAB — BASIC METABOLIC PANEL
BUN: 19 mg/dL (ref 6–23)
CO2: 30 mEq/L (ref 19–32)
Calcium: 9.4 mg/dL (ref 8.4–10.5)
Chloride: 112 mEq/L (ref 96–112)
Chloride: 108 mEq/L (ref 96–112)
Creatinine, Ser: 0.84 mg/dL (ref 0.50–1.35)
GFR calc Af Amer: >90 mL/min (ref >90)
GFR calc Af Amer: >90 mL/min (ref >90)
Glucose, Bld: 221 mg/dL — ABNORMAL HIGH (ref 70–99)
Sodium: 147 mEq/L — ABNORMAL HIGH (ref 135–145)

## 2012-12-03 LAB — CBC WITH DIFFERENTIAL/PLATELET
Basophils Absolute: 0.1 10*3/uL (ref 0.0–0.1)
Basophils Relative: 0 % (ref 0–1)
Eosinophils Relative: 4 % (ref 0–5)
Lymphocytes Relative: 20 % (ref 12–46)
MCHC: 30.8 g/dL (ref 30.0–36.0)
Neutro Abs: 9.5 10*3/uL — ABNORMAL HIGH (ref 1.7–7.7)
Platelets: 340 10*3/uL (ref 150–400)
RDW: 15.2 % (ref 11.5–15.5)
WBC: 13.9 10*3/uL — ABNORMAL HIGH (ref 4.0–10.5)

## 2012-12-03 LAB — PROTIME-INR
INR: 2.21 — ABNORMAL HIGH (ref 0.00–1.49)
Prothrombin Time: 23.8 seconds — ABNORMAL HIGH (ref 11.6–15.2)

## 2012-12-03 LAB — PROCALCITONIN: Procalcitonin: <0.1 ng/mL

## 2012-12-03 LAB — TRIGLYCERIDES: Triglycerides: 125 mg/dL (ref <150)

## 2012-12-03 LAB — GLUCOSE, CAPILLARY: Glucose-Capillary: 124 mg/dL — ABNORMAL HIGH (ref 70–99)

## 2012-12-03 MED ORDER — WARFARIN SODIUM 5 MG PO TABS
5.0000 mg | ORAL_TABLET | Freq: Once | ORAL | Status: DC
  Filled 2012-12-03: qty 1

## 2012-12-03 MED ORDER — INSULIN ASPART 100 UNIT/ML ~~LOC~~ SOLN
0.0000 [IU] | Freq: Three times a day (TID) | SUBCUTANEOUS | Status: DC
  Administered 2012-12-03: 3 [IU] via SUBCUTANEOUS
  Administered 2012-12-03: 2 [IU] via SUBCUTANEOUS

## 2012-12-03 MED ORDER — JEVITY 1.2 CAL PO LIQD
1000.0000 mL | ORAL | Status: DC
  Administered 2012-12-03: 20 mL/h
  Administered 2012-12-07: 1000 mL
  Filled 2012-12-03: qty 1000

## 2012-12-03 MED ORDER — FAMOTIDINE 20 MG PO TABS
20.0000 mg | ORAL_TABLET | Freq: Every day | ORAL | Status: DC
  Administered 2012-12-03 – 2012-12-06 (×4): 20 mg via ORAL
  Filled 2012-12-03 (×5): qty 1

## 2012-12-03 MED ORDER — WARFARIN SODIUM 4 MG PO TABS
4.0000 mg | ORAL_TABLET | Freq: Once | ORAL | Status: AC
  Administered 2012-12-03: 4 mg via ORAL
  Filled 2012-12-03 (×2): qty 1

## 2012-12-03 NOTE — Evaluation (Signed)
Physical Therapy Re- Evaluation Patient Details Name: Frederick [name] MRN: [id number] DOB: 07-Feb-1930 Today's Date: 12/03/2012 Time: 8295-6213 PT Time Calculation (min): 24 min  PT Assessment / Plan / Recommendation History of Present Illness  Pt to ICU with intubation 8/24-8/28 after being found with decreased responsiveness.  Pt now returned to med surg floor  He had panda tube inserted today and mitts are in place on hands so he does not pull it out  Clinical Impression  Pt continues to be limited in mobility but was able to participate with exercise today. He will benefit from continued PT at SNF to decrease burden of care    PT Assessment  Patient needs continued PT services    Follow Up Recommendations  SNF    Does the patient have the potential to tolerate intense rehabilitation      Barriers to Discharge        Equipment Recommendations  None recommended by PT    Recommendations for Other Services     Frequency Min 3X/week    Precautions / Restrictions Precautions Precautions: Fall Restrictions Weight Bearing Restrictions: No Other Position/Activity Restrictions: limited mobility at baseline-stand pivot transfers only (due to chronic R hip issues)   Pertinent Vitals/Pain Pain in RLE with mobility      Mobility  Bed Mobility Bed Mobility: Rolling Left Supine to Sit: 1: +2 Total assist Supine to Sit: Patient Percentage: 20% Details for Bed Mobility Assistance: assisted pt to roll  to remove bedpan.  Pt was unable to  assist Transfers Transfers: Not assessed Ambulation/Gait Ambulation/Gait Assistance: Not tested (comment)    Exercises General Exercises - Lower Extremity Ankle Circles/Pumps: AROM;AAROM;10 reps;Supine Short Arc Quad: AAROM;AROM;Both;5 reps;Supine Hip Flexion/Marching: AAROM;Left;5 reps;Supine Shoulder Exercises Shoulder Flexion: AAROM;AROM;Both;10 reps;Supine   PT Diagnosis: Generalized weakness;Acute pain;Difficulty walking  PT Problem  List: Decreased activity tolerance;Pain;Obesity;Decreased mobility;Decreased strength;Decreased range of motion;Decreased safety awareness PT Treatment Interventions: DME instruction;Functional mobility training;Therapeutic activities;Therapeutic exercise;Patient/family education;Balance training     PT Goals(Current goals can be found in the care plan section) Acute Rehab PT Goals Patient Stated Goal: for pt to be able to stand and pivot to motorized w/c or BSC PT Goal Formulation: With patient Time For Goal Achievement: 12/17/12 Potential to Achieve Goals: Fair  Visit Information  History of Present Illness: Pt to ICU with intubation 8/24-8/28 after being found with decreased responsiveness.  Pt now returned to med surg floor  He had panda tube inserted today and mitts are in place on hands so he does not pull it out       Prior Functioning  Home Living Family/patient expects to be discharged to:: Private residence Living Arrangements: Spouse/significant other Available Help at Discharge: Family Type of Home: House Home Layout: One level Home Equipment: Environmental consultant - 2 wheels;Wheelchair - Engineer, technical sales - power Additional Comments: pt states Genevieve Norlander was working on getting them a hospital bed and and hoyer lift prior to this admission Prior Function Level of Independence: Needs assistance Gait / Transfers Assistance Needed: +1 assist with RW-stand pivot transfers Comments: pt slept in a recliner and used it to assist to stand Communication Communication: HOH;Expressive difficulties (speech difficult to understand)    Cognition  Cognition Arousal/Alertness: Awake/alert Behavior During Therapy: Flat affect Overall Cognitive Status: History of cognitive impairments - at baseline Memory:  (difficulty following commands)    Extremity/Trunk Assessment Lower Extremity Assessment Lower Extremity Assessment: Generalized weakness;RLE deficits/detail;LLE deficits/detail RLE Deficits /  Details: unable to test R hip due to discomfort with movement. R knee  ext 3-/5, muscle atophy pt able to ankle pump LLE Deficits / Details: Strength at least 3+/5 throughout, muscle atrophy pt able to ankle  Cervical / Trunk Assessment Cervical / Trunk Assessment: Kyphotic;Other exceptions Cervical / Trunk Exceptions: obese, generalized atrophy   Balance    End of Session PT - End of Session Activity Tolerance: Patient limited by fatigue Patient left: in bed  GP    Rosey Bath K. [place], [place] [phone] 12/03/2012, 4:46 PM

## 2012-12-03 NOTE — Progress Notes (Signed)
NUTRITION FOLLOW UP  Intervention:   - Will initiate TF via panda tube of Jevity 1.2 start at 51ml/hr increase by 10ml every 4 hours to goal of 40ml/hr. This will provide at goal rate 2160 calories, 100g protein, free water meeting 103% estimated calorie needs, 111% estimated protein needs - Will initiate adult enteral protocol - Will order 5ml liquid multivitamin via panda tube - Diet advancement per MD - Will continue to monitor    Nutrition Dx: Inadequate oral intake related to inability to eat as evidenced by NPO status; ongoing  Goal: Pt to meet >/= 90% of their estimated nutrition needs - not met  Monitor:   Weights, labs, diet advancement, TF initiation and tolerance  Assessment:   Pt was extubated 8/28 AM.  Prior to intubation SLP evaluated pt and pt was put on a dysphagia 1 diet with nectar thick liquids. Per SLP eval note yesterday, pt should remain NPO due to severe aspiration risk.   Panda tube placed this afternoon. RD received consult to start TF. Pt's weight up 5 pounds since admission.   Height: Ht Readings from Last 1 Encounters:  11/26/12 5\' 8"  (1.727 m)    Weight Status:   Wt Readings from Last 1 Encounters:  12/03/12 221 lb 5.5 oz (100.4 kg)    Re-estimated needs:  Kcal: 2100-2450 Protein: 90-105 grams  Fluid: 2.4 L/day  Skin: +1 RLE, LLE, RUE and LUE edema; stage 2 pressure ulcer on sacrum, wound on left leg  Diet Order: NPO   Intake/Output Summary (Last 24 hours) at 12/03/12 1426 Last data filed at 12/03/12 1200  Gross per 24 hour  Intake    580 ml  Output   1070 ml  Net   -490 ml    Last BM: 8/29    Labs:   Recent Labs Lab 11/30/12 0400  12/01/12 0437 12/02/12 0330 12/02/12 1030 12/03/12 0400  NA 140  < > 143  --  143 147*  K 2.8*  < > 3.8  --  2.9* 3.8  CL 98  < > 102  --  107 112  CO2 34*  < > 30  --  31 30  BUN 43*  < > 62*  --  28* 21  CREATININE 1.14  < > 2.02*  --  1.00 0.89  CALCIUM 8.7  < > 9.7  --  8.9 9.4   MG 1.9  --  2.1 2.0  --   --   PHOS 3.1  --  3.6 2.4  --   --   GLUCOSE 258*  < > 121*  --  178* 157*  < > = values in this interval not displayed.  CBG (last 3)   Recent Labs  12/03/12 0405 12/03/12 0755 12/03/12 1155  GLUCAP 146* 123* 132*    Scheduled Meds: . antiseptic oral rinse  15 mL Mouth Rinse q12n4p  . chlordiazePOXIDE  5 mg Oral TID  . chlorhexidine  15 mL Mouth Rinse BID  . Chlorhexidine Gluconate Cloth  6 each Topical Q0600  . DULoxetine  30 mg Oral QHS  . famotidine  20 mg Oral QHS  . insulin aspart  0-15 Units Subcutaneous TID WC  . isosorbide dinitrate  15 mg Per Tube BID  . multivitamin  5 mL Per Tube Daily  . mupirocin ointment  1 application Nasal BID  . senna-docusate  1 tablet Oral BID  . warfarin  4 mg Oral ONCE-1800  . Warfarin - Pharmacist Dosing Inpatient  Does not apply q1800    Continuous Infusions: . sodium chloride 500 mL (12/02/12 1412)   Levon Hedger MS, RD, LDN [phone] Pager [phone] After Hours Pager

## 2012-12-03 NOTE — Progress Notes (Addendum)
ANTICOAGULATION CONSULT NOTE - Follow Up Consult  Pharmacy Consult for warfarin Indication: h/o pulmonary embolism, afib  Allergies  Allergen Reactions  . Hydromorphone Hcl Other (See Comments)    Dilaudid caused Confusion   . Morphine Other (See Comments)    confusion  . Pregabalin Other (See Comments)    lyrica - caused hallucinations    Labs:  Recent Labs  12/01/12 0437 12/01/12 1200 12/02/12 0330 12/02/12 1030 12/03/12 0400  HGB 10.2*  --   --   --  9.8*  HCT 32.0*  --   --   --  31.8*  PLT 256  --   --   --  340  LABPROT 13.5  --  24.5*  --  23.8*  INR 1.05  --  2.29*  --  2.21*  CREATININE 2.02*  --   --  1.00 0.89  CKTOTAL  --  44 39  --   --   CKMB  --  3.6  --   --   --     Assessment:  79 y/o M recently discharged from [facility] 8/16 for sepsis secondary to Pseudomonas UTI, presented 8/21 with progressive SOB, productive cough. Patient with h/o afib and PE, on chronic warfarin. Patient was reportedly taking warfarin 5mg  po daily with last dose 8/21.  On ventilator since 8/24 with possible aspiration pneumonia, extubated 8/28.   Noted TFs stopped 8/28, currently NPO  Inpatient warfarin doses administered 8/22 - 8/28:  5mg , 4mg , 4mg , 4mg , 6mg , 7.5mg , NO dose 8/28  INR today is 2.21, has been fluctuating with questionable interactions with tube feeds and cipro. Dose held last night. Will cautiously resume dosing 8/29.    H/H and pltc acceptable, no bleeding reported.  Goal of Therapy:  INR 2-3 Monitor platelets by anticoagulation protocol: Yes   Plan:   Warfarin 4mg  po x 1 tonight @ 1800  Daily PT/INR  Haynes Hoehn, PharmD 12/03/2012, 9:53 AM  Pager: [phone]    .

## 2012-12-03 NOTE — Progress Notes (Signed)
Speech Language Pathology Dysphagia Treatment Patient Details Name: Frederick [name] MRN: [id number] DOB: [birth year] Today's Date: 12/03/2012 Time: 7829-5621 SLP Time Calculation (min): 22 min  Assessment / Plan / Recommendation Clinical Impression  Minimal trials provided to pt who now has Panda tube. Spoke with pts wife and niece regarding upcoming plan. Pt is not yet able to consume oral POs due to very high aspiration risk resulting from chronic esophageal dysphagia per last MBS and pt history. Wife and family acknowledged this. SLp attempted to prepare them for the possiblity that swallow function may never be "safe" again. At this point best recommendation would be a GI referral (new MD please order!)  If GI cannot offer any treatment, pt will likely need another MBS and likely a palliative referral as function not expected to dramatically improve.     Diet Recommendation  Continue with Current Diet: NPO    SLP Plan     Pertinent Vitals/Pain NA   Swallowing Goals  SLP Swallowing Goals Goal #3: Pt will consume trials of puree with min assist without severe evidence of aspiration to determine readiness for MBS following GI referral.  Swallow Study Goal #3 - Progress: Progressing toward goal  General Temperature Spikes Noted: No Respiratory Status: Supplemental O2 delivered via (comment) Behavior/Cognition: Alert;Confused Oral Cavity - Dentition: Missing dentition Patient Positioning: Upright in bed  Oral Cavity - Oral Hygiene     Dysphagia Treatment Treatment focused on: Skilled observation of diet tolerance;Upgraded PO texture trials;Patient/family/caregiver education Family/Caregiver Educated: wife Treatment Methods/Modalities: Skilled observation;Differential diagnosis Patient observed directly with PO's: Yes Type of PO's observed: Ice chips;Dysphagia 1 (puree) Feeding: Needs assist Liquids provided via: Teaspoon Pharyngeal Phase Signs & Symptoms: Suspected delayed  swallow initiation;Multiple swallows;Delayed throat clear;Delayed cough Type of cueing: Verbal;Tactile Amount of cueing: Moderate   GO    Harlon Ditty, [place] CCC-SLP [phone]   Claudine Mouton 12/03/2012, 3:51 PM

## 2012-12-03 NOTE — Progress Notes (Signed)
PULMONARY  / CRITICAL CARE MEDICINE  Name: Frederick [name] MRN: [id number] DOB: May 08, 1929    ADMISSION DATE:  11/25/2012 CONSULTATION DATE:  11/28/2012  REFERRING MD :  [facility] PRIMARY SERVICE: TRH-->PCCM CHIEF COMPLAINT:  Respiratory failure  BRIEF PATIENT DESCRIPTION: 79 year old male with history of advance dementia (apparently NOT SO per RN and wife 12/01/12 - lived at home and performed ADLs) but lives at SNF presenting to the hospital with respiratory failure and fluid overload.  Also significant history of dysphagia and ?aspiration.  On 8/24 the patient had significant AMS, very somnolent and barely arousable.  ABG revealed hypercarbic and hypoxic respiratory failure.   has a past medical history of Diabetes mellitus; Hypertension; Hypercholesterolemia; Esophageal stricture; Pulmonary embolism (2008); Coronary artery disease; Atrial fibrillation; Anxiety; Depression; Shortness of breath; Peripheral vascular disease; Bladder tumor; GERD (gastroesophageal reflux disease); Edema; Cancer; Arthritis; Pneumonia (2005); Difficult intubation; and Problems with hearing.   has past surgical history that includes Shoulder surgery; Pancreas surgery (2001); Total knee arthroplasty; Lung surgery (2007); Basil cell; Cardiac catheterization (2008); ESOPHAGEAL STRETCH; SKIN CANCER REMOVED FROM LEFT SIDE OF HEAD - REQUIRED SKIN GRAFT FROM LEFT LEG; CARTILAGE REPAIR 198- LEFT KNEE; Joint replacement; Transurethral resection of bladder tumor with gyrus (turbt-gyrus) (N/A, 11/03/2012); Cystoscopy/retrograde/ureteroscopy (11/03/2012); and Cystogram (N/A, 11/03/2012).    LINES / TUBES: ET Tube 8/24>>> OGT 8/24>>> 8/28 RIJ TLC 8/24>>> R radial a-line 8/24>>> 11/30/12  CULTURES: 11/26/12- MRSA PCR _ POSITIVE Blood 8/24>>>neg as of 8/29 Urine 8/24>>>neg as of 8/29 Sputum 8/24>>> neg as of 8/29 C Diff PCR - neg       ANTIBIOTICS: Vanc 8/24>>>8/27 (rising creat) Zosyn 8/24>>>8/28  SIGNIFICANT EVENTS /  STUDIES:  11/25/2012 - admit 8/24 - VDRF requiring intubation. 12/01/12 - learning from nursting staff that he DOES NOT HAVE dementia  12/01/12: Off diprivan: RASS 0. Suddent jump in creatinine today  - doubling. Failed SBT - set off apnea alarms. Otherwsie stable. Wife at bedside. RNs say no dementia   12/02/12 - overnight agiation needing haldol and low dose fent gtt. This aM off sedation and doing SBT/ extubated  - > failed swallow   SUBJECTIVE/OVERNIGHT/INTERVAL HX  12/03/12 -  Doing well post extubation but failed swallow. Now NPO.   VITAL SIGNS: Temp:  [98 F (36.7 C)-98.9 F (37.2 C)] 98.6 F (37 C) (08/29 0400) Pulse Rate:  [83-99] 83 (08/29 0800) Resp:  [17-29] 21 (08/29 0800) BP: (119-159)/(55-89) 155/76 mmHg (08/29 0800) SpO2:  [94 %-100 %] 99 % (08/29 0800) Weight:  [100.4 kg (221 lb 5.5 oz)] 100.4 kg (221 lb 5.5 oz) (08/29 0500)  HEMODYNAMICS: CVP:  [9 mmHg] 9 mmHg  VENTILATOR SETTINGS:    INTAKE / OUTPUT: Intake/Output     08/28 0701 - 08/29 0700 08/29 0701 - 08/30 0700   I.V. (mL/kg) 352 (3.5) 40 (0.4)   NG/GT 70    IV Piggyback 300    Total Intake(mL/kg) 722 (7.2) 40 (0.4)   Urine (mL/kg/hr) 1365 (0.6) 160 (0.4)   Total Output 1365 160   Net -643 -120        Stool Occurrence 1 x      Total I/O In: 40 [I.V.:40] Out: 160 [Urine:160]  PHYSICAL EXAMINATION: General:  Chronically ill appearing elderly male, NAD. Neuro:  Awake / alert, nods, moving ext's. CAM-ICU neg for delirium. RASS 0. Moves all 4 HEENT:  [place]/AT, PERRL, EOM-I, OETT Cardiovascular:  RRR, Nl S1/S2, -M/R/G. Lungs:  Coarse BS diffusely L>R. Abdomen:  Soft, NT, ND and +BS.  Musculoskeletal:  -edema and -tenderness. Skin:  Intact. Some scatterd bruisses _  LABS: PULMONARY  Recent Labs Lab 11/28/12 0842 11/28/12 1145 11/29/12 0430 11/29/12 0627  PHART 7.360 7.582* 7.598* 7.539*  PCO2ART 80.3* 41.0 31.7* 39.9  PO2ART 63.6* 428.0* 99.0 95.3  HCO3 44.3* 38.7* 31.2* 33.9*  TCO2 39.8  33.2 27.4 30.1  O2SAT 89.7 99.2 97.2 96.8    CBC  Recent Labs Lab 11/30/12 0400 12/01/12 0437 12/03/12 0400  HGB 10.0* 10.2* 9.8*  HCT 32.0* 32.0* 31.8*  WBC 11.4* 12.6* 13.9*  PLT 352 256 340    COAGULATION  Recent Labs Lab 11/29/12 0445 11/30/12 0400 12/01/12 0437 12/02/12 0330 12/03/12 0400  INR 1.92* 1.82* 1.05 2.29* 2.21*    CARDIAC    Recent Labs Lab 11/26/12 1349  TROPONINI <0.30    Recent Labs Lab 11/27/12 0538 12/02/12 0330  PROBNP 1393.0* 1778.0*     CHEMISTRY  Recent Labs Lab 11/28/12 0506 11/28/12 0931 11/29/12 0445 11/30/12 0400 11/30/12 1935 12/01/12 0437 12/02/12 0330 12/02/12 1030 12/03/12 0400  NA 140 142 145 140 142 143  --  143 147*  K 3.3* 4.1 3.3* 2.8* 3.2* 3.8  --  2.9* 3.8  CL 90* 91* 100 98 104 102  --  107 112  CO2 43* 43* 35* 34* 33* 30  --  31 30  GLUCOSE 131* 205* 192* 258* 261* 121*  --  178* 157*  BUN 41* 43* 41* 43* 37* 62*  --  28* 21  CREATININE 1.45* 1.46* 1.41* 1.14 0.97 2.02*  --  1.00 0.89  CALCIUM 9.8 9.7 8.9 8.7 8.6 9.7  --  8.9 9.4  MG 1.9  --  1.8 1.9  --  2.1 2.0  --   --   PHOS  --   --  2.0* 3.1  --  3.6 2.4  --   --    Estimated Creatinine Clearance: 74.8 ml/min (by C-G formula based on Cr of 0.89).   LIVER  Recent Labs Lab 11/29/12 0445 11/30/12 0400 12/01/12 0437 12/02/12 0330 12/03/12 0400  INR 1.92* 1.82* 1.05 2.29* 2.21*     INFECTIOUS  Recent Labs Lab 11/28/12 0931  12/01/12 1200 12/02/12 0330 12/03/12 0400  LATICACIDVEN 1.6  --  1.4 1.1  --   PROCALCITON <0.10  < > <0.10 <0.10 <0.10  < > = values in this interval not displayed.   ENDOCRINE CBG (last 3)   Recent Labs  12/02/12 2358 12/03/12 0405 12/03/12 0755  GLUCAP 124* 146* 123*         IMAGING x48h  Dg Chest Port 1 View  12/02/2012   *RADIOLOGY REPORT*  Clinical Data: Endotracheal tube position.  PORTABLE CHEST - 1 VIEW  Comparison: December 01, 2012.  Findings: Endotracheal tube is in grossly good  position with distal tip 2 cm above the carina.  Stable cardiomegaly is noted.  No change is noted in position of right internal jugular catheter. Mild central pulmonary vascular congestion is again noted.  Left basilar opacity is again noted and unchanged concerning for atelectasis and possible associated pleural effusion.  IMPRESSION: Endotracheal tube in grossly good position.  Stable left basilar opacity.   Original Report Authenticated By: Lupita Raider.,  M.D.      Imaging Dg Chest Port 1 View  12/02/2012   *RADIOLOGY REPORT*  Clinical Data: Endotracheal tube position.  PORTABLE CHEST - 1 VIEW  Comparison: December 01, 2012.  Findings: Endotracheal tube is in grossly good position with distal  tip 2 cm above the carina.  Stable cardiomegaly is noted.  No change is noted in position of right internal jugular catheter. Mild central pulmonary vascular congestion is again noted.  Left basilar opacity is again noted and unchanged concerning for atelectasis and possible associated pleural effusion.  IMPRESSION: Endotracheal tube in grossly good position.  Stable left basilar opacity.   Original Report Authenticated By: Lupita Raider.,  M.D.       ASSESSMENT / PLAN:  PULMONARY A:  VDRF due to ?aspiration PNA resulting in hypercarbic respiratory failure; consider also influence of poor abdominal compliance (retained contrast on abd CT scan, hiatal hernia) as CT abd appears to show LLL scarring Hx of PE - in 2008, on coumadin Difficult Intubation - due to limited neck flexion   12/03/12 - s/p extubation yesterday. Doing well  P:   - pulonary toilet and mobilize  CARDIOVASCULAR A:  Hypotension post intubation - resolved / soft but normotensive AFib - on coumadin Hx HTN HLD PVD  12/03/12: INR > 2  P:  - - continue to hold off Avpro ARB - reduced imdur dosing for now; Triad to decide on increasing back to baseline dose - coumadin per pharmacy; - dc sq heparin  RENAL A:   AKI -  Worsening Cr on admit appears pre-renal.  Resolved.  Hx of L double J stenting  Hypokalemia Hypophosphatemia   12/01/12: Doubling in creatinine. AKIN STAGE 2. RIFLE - INJURY. MAKING URINE. GFR 32. Normal CK and lactic - no evidence of propofol infusion syndrome.   12/03/12 - creat normalized    P:   -avoid ARB  -avoid\VERSED - using half DLOXETINE HOME  DOSE since 12/01/12; triad to time increase this back Use half librium HOME DOSE since 12/01/12; triad to time increase this back or not at all (see CNS section)    GASTROINTESTINAL A:   Dysphagia - in setting of intubation, GERD  12/03/12- failed swallow  P:   -NPO - nutn consult for tube feeds - triad to sort out long term food issue  HEMATOLOGIC A: Anemia of critical illness P:  - Daily CBC. - - PRBC for hgb </= 6.9gm%    - exceptions are   -  if ACS susepcted/confirmed then transfuse for hgb </= 8.0gm%,  or    -  If septic shock first 24h and scvo2 < 70% then transfuse for hgb </= 9.0gm%   - active bleeding with hemodynamic instability, then transfuse regardless of hemoglobin value   At at all times try to transfuse 1 unit prbc as possible with exception of active hemorrhage     INFECTIOUS A:  ?Asp PNA. Note Pct negative P:   - Monitor off abx   ENDOCRINE A:  DM by history.   P:   - SSI   NEUROLOGIC Baseline: No dementia per wife. [place] VEt. Likely PTSD - on duloextine and librium (latter since 1970s)  A:  Obtunded - due to hypercarbia and likely sepsis in a patient with severe baseline dementia.   12/02/12 and 12/03/12: RASS 0. CAM-ICU neg for deliruim  P:   - Intermittent sedation protocol with fentanyl - diprivan can be restarted if neeed but aim to extubate - avoid versed -half dose of cymbalta and librium - home meds. Triad to decide on final dosage regimen on these  TODAY'S SUMMARY:   11/30/12: Updated wife at bedside, patient looks better 8/26 in regards to extubation.  Explained extensively  that this will be a day  to day review of patients status.  Per family, he would be ok with short term intubation but not trach/peg, ok with LCB with no CPR and no cardioversion.  Discussions have been that we will get him to the best point possible with extubation and no plans to reintubate. Wife is still contemplating this and may not be completely comfortable with withdrawal if he fails extubation. We agreed to discuss further.   11/11/12: Wife and niece updated. Keep intubated  12/02/12: wife and niece updated. Ok to reinbuate x few more times. No CPR. Wife not ready to give up   12/03/12 - move to med-surg. Given to triad DR Jomarie Longs for 12/04/12       Dr. Kalman Shan, M.D., [facility].C.P Pulmonary and Critical Care Medicine Staff Physician [facility] [place] [facility] Pager: [phone], If no answer or between  15:00h - 7:00h: call [phone]  12/03/2012 11:10 AM

## 2012-12-03 NOTE — Progress Notes (Signed)
Meds not given for 10 AM pending Panda placement and confirmation. Reported to receiving RN on [address] during handoff.  Dimitriy Carreras, Georga Hacking, RN

## 2012-12-03 NOTE — Progress Notes (Signed)
Patient transferred from [facility], wife at bedside.Has a enteric tube to R nare.Placed comfortably in bed.- Hulda Marin RN

## 2012-12-04 DIAGNOSIS — G929 Unspecified toxic encephalopathy: Secondary | ICD-10-CM | POA: Diagnosis present

## 2012-12-04 DIAGNOSIS — G92 Toxic encephalopathy: Secondary | ICD-10-CM | POA: Diagnosis present

## 2012-12-04 DIAGNOSIS — R410 Disorientation, unspecified: Secondary | ICD-10-CM | POA: Diagnosis present

## 2012-12-04 LAB — CBC WITH DIFFERENTIAL/PLATELET
Basophils Absolute: 0.1 10*3/uL (ref 0.0–0.1)
Basophils Relative: 0 % (ref 0–1)
Eosinophils Relative: 1 % (ref 0–5)
HCT: 33.5 % — ABNORMAL LOW (ref 39.0–52.0)
MCHC: 30.1 g/dL (ref 30.0–36.0)
MCV: 98.5 fL (ref 78.0–100.0)
Monocytes Absolute: 1.2 10*3/uL — ABNORMAL HIGH (ref 0.1–1.0)
Neutro Abs: 10.6 10*3/uL — ABNORMAL HIGH (ref 1.7–7.7)
RDW: 15.3 % (ref 11.5–15.5)

## 2012-12-04 LAB — CULTURE, BLOOD (ROUTINE X 2): Culture: NO GROWTH

## 2012-12-04 LAB — GLUCOSE, CAPILLARY: Glucose-Capillary: 265 mg/dL — ABNORMAL HIGH (ref 70–99)

## 2012-12-04 LAB — PROTIME-INR: INR: 2.68 — ABNORMAL HIGH (ref 0.00–1.49)

## 2012-12-04 LAB — BASIC METABOLIC PANEL
Calcium: 9.2 mg/dL (ref 8.4–10.5)
Creatinine, Ser: 0.83 mg/dL (ref 0.50–1.35)
GFR calc Af Amer: >90 mL/min (ref >90)
GFR calc non Af Amer: 80 mL/min — ABNORMAL LOW (ref >90)

## 2012-12-04 LAB — MAGNESIUM: Magnesium: 1.9 mg/dL (ref 1.5–2.5)

## 2012-12-04 MED ORDER — WARFARIN SODIUM 2.5 MG PO TABS
2.5000 mg | ORAL_TABLET | Freq: Once | ORAL | Status: AC
  Administered 2012-12-04: 2.5 mg
  Filled 2012-12-04: qty 1

## 2012-12-04 MED ORDER — INSULIN ASPART 100 UNIT/ML ~~LOC~~ SOLN
0.0000 [IU] | SUBCUTANEOUS | Status: DC
  Administered 2012-12-04: 8 [IU] via SUBCUTANEOUS
  Administered 2012-12-04: 5 [IU] via SUBCUTANEOUS
  Administered 2012-12-04 – 2012-12-05 (×4): 8 [IU] via SUBCUTANEOUS
  Administered 2012-12-05: 5 [IU] via SUBCUTANEOUS

## 2012-12-04 MED ORDER — FREE WATER
200.0000 mL | Freq: Four times a day (QID) | Status: DC
  Administered 2012-12-04 – 2012-12-06 (×8): 200 mL

## 2012-12-04 MED ORDER — INSULIN GLARGINE 100 UNIT/ML ~~LOC~~ SOLN
10.0000 [IU] | Freq: Every day | SUBCUTANEOUS | Status: DC
  Administered 2012-12-04: 10 [IU] via SUBCUTANEOUS
  Filled 2012-12-04 (×2): qty 0.1

## 2012-12-04 MED ORDER — IRBESARTAN 150 MG PO TABS
150.0000 mg | ORAL_TABLET | Freq: Every day | ORAL | Status: DC
  Administered 2012-12-04 – 2012-12-07 (×4): 150 mg via ORAL
  Filled 2012-12-04 (×4): qty 1

## 2012-12-04 NOTE — Progress Notes (Signed)
ANTICOAGULATION CONSULT NOTE - Follow Up Consult  Pharmacy Consult for warfarin Indication: h/o pulmonary embolism, afib  Allergies  Allergen Reactions  . Hydromorphone Hcl Other (See Comments)    Dilaudid caused Confusion   . Morphine Other (See Comments)    confusion  . Pregabalin Other (See Comments)    lyrica - caused hallucinations    Labs:  Recent Labs  12/01/12 1200 12/02/12 0330  12/03/12 0400 12/03/12 2328 12/04/12 0515  HGB  --   --   --  9.8*  --  10.1*  HCT  --   --   --  31.8*  --  33.5*  PLT  --   --   --  340  --  342  LABPROT  --  24.5*  --  23.8*  --  27.6*  INR  --  2.29*  --  2.21*  --  2.68*  CREATININE  --   --   < > 0.89 0.84 0.83  CKTOTAL 44 39  --   --   --   --   CKMB 3.6  --   --   --   --   --   < > = values in this interval not displayed.  Assessment: 79 y/o M recently discharged from [facility] 8/16 for sepsis secondary to Pseudomonas UTI, presented 8/21 with progressive SOB, productive cough. Patient with h/o afib and PE, on chronic warfarin. Patient was reportedly taking warfarin 5mg  po daily with last dose 8/21.  On ventilator since 8/24 with possible aspiration pneumonia, extubated 8/28.   TF reinitiated, currently at 60 ml/hr  INR therapeutic but increasing 2.68, has been fluctuating with questionable interactions with tube feeds and cipro (dc 8/23). Anticipate it will stabilize now with continuous nutrition.  H/H and pltc acceptable, no bleeding reported.  Goal of Therapy:  INR 2-3 Monitor platelets by anticoagulation protocol: Yes   Plan:   Decrease warfarin 2.5mg  via tube x 1 tonight @ 1800  Daily PT/INR  Loralee Pacas, PharmD, BCPS Pager: [phone]  12/04/2012, 7:34 AM      .

## 2012-12-04 NOTE — Progress Notes (Addendum)
[facility] PROGRESS NOTE  Frederick [name] ZOX:[id number] DOB: Feb 20, 1930 DOA: 11/25/2012 PCP: Georgianne Fick, MD  Brief narrative: Frederick [name] is an 79 y.o. male with a PMH of DM, hypertension, hyperlipidemia, coronary artery disease, atrial fibrillation, peripheral vascular disease, recent hospitalization for treatment of pseudomonal UTI who was admitted on 11/26/2012 with cough and chest x-ray findings of pulmonary edema a left-sided pleural effusion concerning for decompensated heart failure. On 11/28/2012, the patient's mental status became significantly altered with somnolence, and he was found to be hypercarbic and hypoxic with worsening respiratory failure, ultimately requiring intubation. He was successfully extubated on 12/02/2012. On 12/03/2012, a speech therapy evaluation was performed and he was found to have significant dysphasia necessitating placement of a feeding tube for tube feeds.  Assessment/Plan: Principal Problem:   Acute respiratory failure with hypoxia -Multifactorial with aspiration pneumonia and CHF contributory. -Intubated 11/28/2012-12/02/2012. -Continue pulmonary toilet and increase mobilization, respiratory status now improving. Active Problems:   Hyponatremia -Add free water to tube feeds, 200 cc per tube every 6 hours.   DIABETES MELLITUS, TYPE II, ON INSULIN -Currently on SSI before meals. Since on continuous tube feeds, change SSI coverage to every 4 hours, moderate scale. Add Lantus, 10 units daily. -CBGs [id number].   HYPERTENSION -Continue hydralazine as needed. -Continue Isordil. Resume Avapro given elevated blood pressure.   History of ESOPHAGEAL STRICTURE, dysphagia -Failed swallowing evaluation on 12/03/2012. -Will need GI evaluation to determine if esophageal stricture playing a role in his current dysphasia and if so, if there is a recommended treatment.  Discussed with Dr. Loreta Ave. -Panda tube placed with initiation of tube feeding per dietitian  recommendations.  Tolerating well.   GERD -Continue Pepcid.   Atrial fibrillation, permanent -Rate controlled, on chronic Coumadin.   Coronary artery disease, last cath 2008 occluded nondom, LCX, wth moderate ostial RCA disease, normal LV function -Continue Isordil and Coumadin.   Diastolic CHF -Status post cardiology consultation done on 11/26/2012. Recommended a more aggressive diuretic regimen upon discharge. -Monitor intake and output closely, daily weights, and diurese as needed. -Two-dimensional echocardiogram ordered. Followup results. ARB resumed.   Acute renal failure -Resolved.   Hypokalemia -Monitor and replace electrolytes as needed.   Septic shock secondary to Aspiration pneumonia -Completed 4 days of therapy with vancomycin and 5 days of Zosyn. -Blood and urine cultures negative. Tracheal aspirate grew a few colonies of yeast.   Delirium/toxic encephalopathy/Altered mental status -No history of advanced dementia, as initially reported. -Likely toxic encephalopathy from acute illness and hypoxia/hypercarbia. -Still having periods of confusion per wife, with sundowning. -Has safety mittens on.  Code Status: Partial Family Communication: Wife updated at bedside. Disposition Plan: SNF.   Medical Consultants:  Dr. Kalman Shan, Pulmonology.  Dr. Nanetta Batty, Cardiology  Other Consultants:  Diabetes coordinator  Dietician  Anti-infectives:  Vanc 8/24>>>8/27 (rising creat)   Zosyn 8/24>>>8/28   HPI/Subjective: Frederick [name] is asleep. He has tube feedings going and safety mittens on. His wife tells me that he has had periods of confusion, mostly in the evenings. He is talking inappropriately at times.  Objective: Filed Vitals:   12/03/12 1300 12/03/12 1431 12/03/12 2230 12/04/12 0540  BP:  160/82 160/92 157/88  Pulse: 97 90 93 93  Temp:  98 F (36.7 C) 98.5 F (36.9 C) 98.2 F (36.8 C)  TempSrc:  Oral Oral Oral  Resp: 23 20 20 20   Height:       Weight:    100.3 kg (221 lb 1.9 oz)  SpO2: 98% 96% 97% 98%  Intake/Output Summary (Last 24 hours) at 12/04/12 0928 Last data filed at 12/04/12 0542  Gross per 24 hour  Intake    440 ml  Output    750 ml  Net   -310 ml    Exam: Gen:  Lethargic Cardiovascular:  Heart sounds are irregular  Respiratory:  Lungs diminished Gastrointestinal:  Abdomen soft, NT/ND, + BS Extremities:  1+ edema  Data Reviewed: Basic Metabolic Panel:  Recent Labs Lab 11/29/12 0445 11/30/12 0400  12/01/12 0437 12/02/12 0330 12/02/12 1030 12/03/12 0400 12/03/12 2328 12/04/12 0515  NA 145 140  < > 143  --  143 147* 143 146*  K 3.3* 2.8*  < > 3.8  --  2.9* 3.8 4.1 3.9  CL 100 98  < > 102  --  107 112 108 109  CO2 35* 34*  < > 30  --  31 30 31 30   GLUCOSE 192* 258*  < > 121*  --  178* 157* 221* 300*  BUN 41* 43*  < > 62*  --  28* 21 19 20   CREATININE 1.41* 1.14  < > 2.02*  --  1.00 0.89 0.84 0.83  CALCIUM 8.9 8.7  < > 9.7  --  8.9 9.4 9.1 9.2  MG 1.8 1.9  --  2.1 2.0  --   --   --  1.9  PHOS 2.0* 3.1  --  3.6 2.4  --   --   --  2.2*  < > = values in this interval not displayed. GFR Estimated Creatinine Clearance: 80.2 ml/min (by C-G formula based on Cr of 0.83). Liver Function Tests: No results found for this basename: AST, ALT, ALKPHOS, BILITOT, PROT, ALBUMIN,  in the last 168 hours No results found for this basename: LIPASE, AMYLASE,  in the last 168 hours No results found for this basename: AMMONIA,  in the last 168 hours Coagulation profile  Recent Labs Lab 11/30/12 0400 12/01/12 0437 12/02/12 0330 12/03/12 0400 12/04/12 0515  INR 1.82* 1.05 2.29* 2.21* 2.68*    CBC:  Recent Labs Lab 11/29/12 0445 11/30/12 0400 12/01/12 0437 12/03/12 0400 12/04/12 0515  WBC 13.6* 11.4* 12.6* 13.9* 13.3*  NEUTROABS  --   --   --  9.5* 10.6*  HGB 11.3* 10.0* 10.2* 9.8* 10.1*  HCT 36.2* 32.0* 32.0* 31.8* 33.5*  MCV 94.5 94.7 94.1 98.1 98.5  PLT 387 352 256 340 342   Cardiac  Enzymes:  Recent Labs Lab 12/01/12 1200 12/02/12 0330  CKTOTAL 44 39  CKMB 3.6  --    BNP (last 3 results)  Recent Labs  11/27/12 0538 12/02/12 0330 12/04/12 0515  PROBNP 1393.0* 1778.0* 3470.0*   CBG:  Recent Labs Lab 12/03/12 0755 12/03/12 1155 12/03/12 1705 12/03/12 2227 12/04/12 0757  GLUCAP 123* 132* 171* 189* 277*   Lipid Profile  Recent Labs  12/03/12 0400  TRIG 125   Microbiology Recent Results (from the past 240 hour(s))  MRSA PCR SCREENING     Status: Abnormal   Collection Time    11/26/12  4:51 AM      Result Value Range Status   MRSA by PCR POSITIVE (*) NEGATIVE Final   Comment:            The GeneXpert MRSA Assay (FDA     approved for NASAL specimens     only), is one component of a     comprehensive MRSA colonization     surveillance program. It is not  intended to diagnose MRSA     infection nor to guide or     monitor treatment for     MRSA infections.     RESULT CALLED TO, READ BACK BY AND VERIFIED WITH:     A. MOORE RN AT 1610 ON 08.22.14 BY SHUEA  URINE CULTURE     Status: None   Collection Time    11/28/12 10:40 AM      Result Value Range Status   Specimen Description URINE, CATHETERIZED   Final   Special Requests NONE   Final   Culture  Setup Time     Final   Value: 11/28/2012 15:47     Performed at [facility] Count     Final   Value: NO GROWTH     Performed at [facility]   Culture     Final   Value: NO GROWTH     Performed at [facility]   Report Status 11/29/2012 FINAL   Final  CULTURE, BLOOD (ROUTINE X 2)     Status: None   Collection Time    11/28/12 11:00 AM      Result Value Range Status   Specimen Description BLOOD LEFT ARM   Final   Special Requests BOTTLES DRAWN AEROBIC AND ANAEROBIC 6 CC EA   Final   Culture  Setup Time     Final   Value: 11/28/2012 19:51     Performed at [facility]   Culture     Final   Value:        BLOOD CULTURE RECEIVED NO GROWTH TO  DATE CULTURE WILL BE HELD FOR 5 DAYS BEFORE ISSUING A FINAL NEGATIVE REPORT     Performed at [facility]   Report Status PENDING   Incomplete  CULTURE, BLOOD (ROUTINE X 2)     Status: None   Collection Time    11/28/12 11:11 AM      Result Value Range Status   Specimen Description BLOOD LEFT ARM   Final   Special Requests BOTTLES DRAWN AEROBIC AND ANAEROBIC 6 CC EA   Final   Culture  Setup Time     Final   Value: 11/28/2012 19:51     Performed at [facility]   Culture     Final   Value:        BLOOD CULTURE RECEIVED NO GROWTH TO DATE CULTURE WILL BE HELD FOR 5 DAYS BEFORE ISSUING A FINAL NEGATIVE REPORT     Performed at [facility]   Report Status PENDING   Incomplete  CULTURE, RESPIRATORY (NON-EXPECTORATED)     Status: None   Collection Time    11/28/12 11:35 AM      Result Value Range Status   Specimen Description TRACHEAL ASPIRATE   Final   Special Requests NONE   Final   Gram Stain     Final   Value: FEW WBC PRESENT, PREDOMINANTLY PMN     RARE SQUAMOUS EPITHELIAL CELLS PRESENT     RARE GRAM POSITIVE COCCI IN PAIRS     Performed at [facility]   Culture     Final   Value: FEW CANDIDA ALBICANS     Performed at [facility]   Report Status 12/01/2012 FINAL   Final  CLOSTRIDIUM DIFFICILE BY PCR     Status: None   Collection Time    12/01/12 11:26 PM      Result Value  Range Status   C difficile by pcr NEGATIVE  NEGATIVE Final   Comment: Performed at [facility]     Procedures and Diagnostic Studies: Ct Abdomen Pelvis Wo Contrast  11/16/2012   *RADIOLOGY REPORT*  Clinical Data: Urinary tract infection with sepsis.  Evaluate for potential pyelonephritis.  Fever.  CT ABDOMEN AND PELVIS WITHOUT CONTRAST  Technique:  Multidetector CT imaging of the abdomen and pelvis was performed following the standard protocol without intravenous contrast.  Comparison: CT of the abdomen and pelvis 10/14/2012.  Findings:  Lung Bases: Probable  scarring in the left lung base.  Cardiomegaly. Atherosclerotic calcifications in the left main, left anterior descending, left circumflex and right coronary arteries. Calcifications of the aortic valve.  Moderate sized hiatal hernia. Calcified pleural plaques in the posterior aspect of the left hemithorax.  Abdomen/Pelvis:  Small calcified gallstones layering dependently in the gallbladder.  No current findings to suggest acute cholecystitis at this time.  Severe pancreatic atrophy.  Status post splenectomy.  Surgical clips near the gastroesophageal junction.  The unenhanced appearance of the right kidney and bilateral adrenal glands is unremarkable.  An exophytic low attenuation lesion measuring 3.7 cm in diameter extending from the posterior aspect of the left kidney is incompletely characterized on today's noncontrast CT examination, but was previously characterized as a simple cyst.  A left-sided double J ureteral stent with the proximal loop properly reformed within the left renal pelvis and the distal loop properly reformed within the lumen of the urinary bladder.  Numerous colonic diverticula, without surrounding inflammatory changes to suggest acute diverticulitis at this time.  No significant volume of ascites.  No pneumoperitoneum.  No pathologic distension of small bowel.  No definite pathologic lymphadenopathy identified within the abdomen or pelvis.  Foley balloon catheter in place with tip in the lumen of the urinary bladder.  Small amount of gas, dependently in the lumen of the urinary bladder presumably iatrogenic. Urinary bladder wall appears thickened, however, this is likely accentuated by under distension of the urinary bladder. Small amount of stranding in the fat adjacent to the urinary bladder.  Musculoskeletal: There are no aggressive appearing lytic or blastic lesions noted in the visualized portions of the skeleton.  IMPRESSION: 1. Thickening of the urinary bladder wall with a small amount  of soft tissue stranding in the adjacent fat surrounding the urinary bladder may suggest cystitis.  Clinical correlation is recommended.  2.  No imaging findings on today's noncontrast CT examination to strongly suggest presence of pyelonephritis, however, clinical correlation and urinalysis is recommended if there is suspicion for pyelonephritis. 3.  Left-sided double J ureteral stent appears properly located. 4.  Cholelithiasis without evidence to suggest acute cholecystitis at this time. 5.  Colonic diverticulosis without findings to suggest acute diverticulitis at this time. 6.  Status post splenectomy. 7.  Moderate hiatal hernia. 8.  Atherosclerosis, including left main and three post coronary artery disease. 9.  Additional incidental findings, as above.   Original Report Authenticated By: Trudie Reed, M.D.   Dg Chest 2 View  11/25/2012   *RADIOLOGY REPORT*  Clinical Data: Cough and chest congestion.  CHEST - 2 VIEW  Comparison: 11/15/2012  Findings: Shallow inspiration.  Mild cardiac enlargement with pulmonary vascular congestion and central interstitial changes suggesting edema.  This is progressing since the previous study. There is superimposed infiltration or atelectasis in the left lung base.  Small left pleural effusion is not excluded.  No pneumothorax.  Calcified and tortuous aorta.  Degenerative changes in  the spine and shoulders.  Surgical clips in the left upper quadrant.  Left ureteral stent.  IMPRESSION: Cardiac enlargement with developing pulmonary vascular congestion and perihilar edema.  Infiltration or atelectasis in the left lung base.  Possible left pleural effusion.   Original Report Authenticated By: Burman Nieves, M.D.   Dg Chest 2 View  11/15/2012   *RADIOLOGY REPORT*  Clinical Data: Chest pain.  Shortness of breath.  Weakness.  CHEST - 2 VIEW  Comparison: Chest x-ray 10/12/2012.  Findings: Lung volumes are low.  Film is under penetrated, which limits the Diagnostic  sensitivity and specificity of this examination.  With these limitations in mind, there are bibasilar opacities (left greater than right), favored to represent subsegmental atelectasis.  No definite pleural effusions. Cephalization of the pulmonary vasculature, with evidence of very mild interstitial pulmonary edema.  Mild cardiomegaly. Retrocardiac density compatible with a moderate sized hiatal hernia. The patient is rotated to the left on today's exam, resulting in distortion of the mediastinal contours and reduced diagnostic sensitivity and specificity for mediastinal pathology. Atherosclerosis of the thoracic aorta. Multiple surgical clips are noted projecting over the upper abdomen.  There appears to be a left-sided double J ureteral stent in position, however this is incompletely visualized.  IMPRESSION: 1.  Findings, as above, concerning for early congestive heart failure. 2.  Atherosclerosis. 3.  Moderate hiatal hernia. 4.  Postoperative changes, as above.   Original Report Authenticated By: Trudie Reed, M.D.   Dg Abd 1 View  12/03/2012   *RADIOLOGY REPORT*  Clinical Data: Panda placement confirmation.  ABDOMEN - 1 VIEW  Comparison: CT abdomen pelvis 11/15/2012  Findings: Enteric tube is seen with the tip overlying the expected location of the proximal stomach. Proximal and mid portions of a left double J ureteral stent are seen and appear properly positioned. Surgical clips are again noted in the left upper quadrant.  Nonobstructive bowel gas pattern. No acute osseous abnormality.  IMPRESSION: 1. Enteric tube tip overlying the stomach.  2. No acute findings.   Original Report Authenticated By: Jerene Dilling, M.D.   Dg Chest Port 1 View  12/02/2012   *RADIOLOGY REPORT*  Clinical Data: Endotracheal tube position.  PORTABLE CHEST - 1 VIEW  Comparison: December 01, 2012.  Findings: Endotracheal tube is in grossly good position with distal tip 2 cm above the carina.  Stable cardiomegaly is noted.   No change is noted in position of right internal jugular catheter. Mild central pulmonary vascular congestion is again noted.  Left basilar opacity is again noted and unchanged concerning for atelectasis and possible associated pleural effusion.  IMPRESSION: Endotracheal tube in grossly good position.  Stable left basilar opacity.   Original Report Authenticated By: Lupita Raider.,  M.D.   Dg Chest Port 1 View  12/01/2012   *RADIOLOGY REPORT*  Clinical Data: Evaluate endotracheal tube  PORTABLE CHEST - 1 VIEW  Comparison: Prior chest x-ray 11/27/2012  Findings: The tip of the endotracheal tube is 1.7 cm above the carina.  Stable position of right IJ central venous catheter with the tip at the superior cavoatrial junction.  The tip of the nasogastric tube projects over the stomach. Slightly improved left lower lobe collapse.  Persistent bilateral layering effusions and associated bibasilar opacities.  Multiple surgical clips project over the left upper quadrant and mid epigastric region. Incompletely imaged left double-J ureteral stent.  Stable cardiomegaly and aortic atherosclerosis.  IMPRESSION:  1.  Slightly improved left lower lobe atelectasis/collapse. 2.  Persistent left greater than  right layering pleural effusions and associated bibasilar atelectasis versus infiltrate 3.  Stable support apparatus.  The tip of the endotracheal tube is 1.7 cm above the carina.   Original Report Authenticated By: Malachy Moan, M.D.   Dg Chest Port 1 View  11/30/2012   *RADIOLOGY REPORT*  Clinical Data: Atelectasis, endotracheal tube placement.  PORTABLE CHEST - 1 VIEW  Comparison: 11/29/2012.  Findings: Endotracheal tube terminates approximately 1.7 cm above the carina.  Nasogastric tube is followed into the stomach.  Right IJ central line tip projects over the SVC.  Heart size is grossly stable.  Left lower lobe collapse/consolidation and left pleural effusion persist.  Lungs are overall low in volume with mild right  basilar air space disease as well.  Surgical clips are seen in the upper abdomen.  IMPRESSION:  1.  Persistent left lower lobe collapse/consolidation.  Follow-up to clearing is recommended. 2.  Mild bibasilar air space disease.   Original Report Authenticated By: Leanna Battles, M.D.   Dg Chest Port 1 View  11/29/2012   *RADIOLOGY REPORT*  Clinical Data: Respiratory failure.  PORTABLE CHEST - 1 VIEW  Comparison: 11/28/2012  Findings: Endotracheal tube remains with the tip approximately 2 cm above the carina.  Central line and nasogastric tube positioning are stable.  Lungs show relatively stable left lower lobe atelectasis.  No pulmonary edema or pleural effusions are identified.  Heart size is stable.  IMPRESSION: Stable left lower lobe atelectasis.   Original Report Authenticated By: Irish Lack, M.D.   Dg Chest Port 1 View  11/28/2012   *RADIOLOGY REPORT*  Clinical Data: Respiratory distress.  Intubation, central venous catheter placement and nasogastric tube placement.  PORTABLE CHEST - 1 VIEW 11/28/2012 1029 hours:  Comparison: Portable chest x-ray earlier same day 0851 hours.  Findings: Endotracheal tube tip projects approximately 2 cm above carina.  Right jugular central venous catheter tip projects over the lower SVC.  No evidence of pneumothorax mediastinal hematoma. Nasogastric tube tip in the fundus the stomach.  Cardiac silhouette enlarged but stable.  Mild pulmonary venous hypertension, improved since earlier in the morning, without overt edema currently. Slight improved aeration in the left lower lobe, though moderate consolidation persist.  No new pulmonary parenchymal abnormalities.  IMPRESSION:  1.  Endotracheal tube tip approximately 2 cm above the carina. 2.  Right jugular central venous catheter tip in the lower SVC.  No acute complicating features. 3.  Nasogastric tube tip in the gastric fundus. 4.  Interval resolution of interstitial pulmonary edema since earlier same date.  Improved  aeration in the left lower lobe, though moderate atelectasis and/or pneumonia persists.  No new abnormalities.   Original Report Authenticated By: Hulan Saas, M.D.   Dg Chest Port 1v Same Day  11/28/2012   *RADIOLOGY REPORT*  Clinical Data: Lethargy.  Respiratory distress.  PORTABLE CHEST - 1 VIEW SAME DAY  Comparison: 11/25/2012  Findings: The study is somewhat limited by respiratory motion, low lung volumes and a semi-erect rotated positioning.  Left lung base opacity noted previously is stable most likely atelectasis although infiltrate is possible.  There is some central vascular congestion. Mild right perihilar airspace opacity is suggested which may reflect asymmetric edema.  Cardiac silhouette is mildly enlarged.  No pneumothorax is appreciated.  IMPRESSION: Allowing for differences in patient positioning and technique, findings are similar to the prior exam.  There may be mild pulmonary edema although this appearance is accentuated by low lung volumes and motion artifact.  Left lung base opacities most  likely atelectasis.   Original Report Authenticated By: Amie Portland, M.D.   Dg Swallowing Func-speech Pathology  11/27/2012   Lacinda Axon, CCC-SLP     11/27/2012  6:07 PM Objective Swallowing Evaluation: Modified Barium Swallowing Study   Patient Details  Name: Darryn Kydd MRN: [id number] Date of Birth: Oct 03, 1929  Today's Date: 11/27/2012 Time: 1700-1730 SLP Time Calculation (min): 30 min  Past Medical History:  Past Medical History  Diagnosis Date  . Diabetes mellitus   . Hypertension   . Hypercholesterolemia   . Esophageal stricture   . Pulmonary embolism 2008    chronic coumadin   . Coronary artery disease     DR. BERRY IS PT'S CARDIOLOGIST  . Atrial fibrillation     CHRONIC COUMADIN-pemanent atrial fib  . Anxiety     SINCE 1975   . Depression     SINCE 1975    . Shortness of breath     NOT SURE WHAT CAUSES SOB - BUT HE IS NOT ABLE TO BE ACTIVE -  AND IS SOB WITH ANY ACTIVITY - USES OXYGEN 2  L NASAL CANNULA -  ALL THE TIME -EXCEPT WHEN IN SHOWER OR CHANGING CLOTHES on home  02  . Peripheral vascular disease     TOLD SOME SMALL AMOUNT OF BLOCKAGE IN LEG WHEN HEART CATH DONE  2008  . Bladder tumor     HAS HAD HEMATURIA OFF AND ON - HAS FOLEY CATH THAT WAS PLACED  JUNE 20TH, 2014  . GERD (gastroesophageal reflux disease)   . Edema     FEET AND LEGS MOST DAYS - SOMETIMES WEARS COMPRESSION HOSE  . Cancer     SKIN CANCERS  . Arthritis     S/P BILATERAL TOTAL KNEE REPLACEMENTS - BUT BOTH KNEES PAINFUL  AND JOINTS WORN OUT; BAD RIGHT HIP - BUT NOT A CANDIDATE FOR HIP  PREPLACMENT;  HAS SEVERE LOWER  BACK AND LEG PAIN - HAS SPINAL  STENOSIS AND BULGING DISC; CURVATURE OF UPPER SPINE - UNABLE TO  LAY HIS HEAD FLAT.  Marland Kitchen Pneumonia 2005  . Difficult intubation     DUE TO LIMITED NECK FLEXION AND SHORT NECK--ANESTHESIA RECORD  FROM 2007 VATS SURGERY AT [facility] OBTAINED AND ON PT'S CHART.  Marland Kitchen Problems with hearing     WEARS BILATERAL HEARING AIDS   Past Surgical History:  Past Surgical History  Procedure Laterality Date  . Shoulder surgery      bilateral  . Pancreas surgery  2001    TUMOR REMOVED FROM PANCREAS AND SPLEEN ALSO REMOVED  . Total knee arthroplasty      bilateral  . Lung surgery  2007    non-cancer  VIDEO ASSISTED THORCOTOMY TO REMOVE FLUID /  DECORTICATION. DR. Edwyna Shell  . Basil cell    . Cardiac catheterization  2008    totally occluded nondominant LCX wth mod. ostial RCA disease  and nl. EF  . Esophageal stretch    . Skin cancer removed from left side of head - required skin  graft from left leg    . Cartilage repair 198- left knee    . Joint replacement    . Transurethral resection of bladder tumor with gyrus  (turbt-gyrus) N/A 11/03/2012    Procedure: TRANSURETHRAL RESECTION OF BLADDER TUMOR WITH GYRUS  (TURBT-GYRUS);  Surgeon: Milford Cage, MD;  Location: [facility]  [facility];  Service: Urology;  Laterality: N/A;  . Cystoscopy/retrograde/ureteroscopy  11/03/2012    Procedure: CYSTOSCOPY/RETROGRADE/ LEFT  URETEROSCOPY AND STENT  PLACEMENT;  Surgeon: Milford Cage, MD;  Location: [facility]  [facility];  Service: Urology;;  . Theador Hawthorne N/A 11/03/2012    Procedure: CYSTOGRAM;  Surgeon: Milford Cage, MD;   Location: [facility];  Service: Urology;  Laterality: N/A;   HPI:  Summit Arroyave is a 79 y.o. male who was just discharged from  our service 6 days ago after an admission for a UTI with  pseudomonas.  Since discharge he has getting progressively short  of breath until today he developed cough, productive of a whitish  sputum.  He has had no fever, no chills, no sweating since Monday  of this week.  In the ED while his WBC is elevated at 17k this is  significantly down from the >30k it was at discharge 6 days ago.   At the time of discharge he had been taken off of his Zaroxylyn  and had not yet restarted this, he did restart his lasix on  Tuesday.  MBS indicated following results of BSE completed on  11/25/12.       Assessment / Plan / Recommendation Clinical Impression  Dysphagia Diagnosis: Moderate oral phase dysphagia;Severe  pharyngeal phase dysphagia;Severe cervical esophageal phase  dysphagia;Suspected primary esophageal dysphagia  MBS completed.  Patient with head down posture with difficulty   maintaining  upright positon.  Assist required to reposition  patient throughout study as view blocked by shoulders.   Evaluation indicates moderate oral dysphagia marked by weak  lingual manipulation and reduced posterior propulsion.  Piecemeal  swallows with all consistencies.  Severe pharyngeal phase  dysphagia with suspected primary esophageal dysphagia.  Decreased  TBR resulting in residuals in vallecular space with all  consistencies.  Reduced pharyngeal peristalsis.  Diffuse residue  from vallecular space to pyriforms s/p swallow of nectar barium  by cup and spoon, puree and mechanical soft consistency.   Prominent CP segment with pooling in pyriforms with all  consistencies prior to swallow with moderate amount of   containment s/p swallow.  Backflow into pharynx during swallow of  thin liquid barium by cup, straw and spoon with eventual  penetration s/p swallow from residuals.  Unable to confirm  aspiration with thins due to shoulders blocking view. Strategies  of multiple effortful swallows with puree consistency and nectar  thick liquids by straw  effective in clearing majority of  residuals but patient quickly fatigued. No backflow to pharynx  noted with puree and nectar thick barium.    Brief esophageal  sweep revealed significant amount of stasis with backflow to  cervical esophagus.  No radiologist present to confirm.  Patient  at high risk for aspiration with any consistency due to residuals  and risk of backflow of bolus into pharynx from cervical  esophagus.   MD given results of evaluation by treating SLP.  MD  confirmed to proceed with dysphagia 1 consistency and nectar  thick liquids in limited amounts with full supervision with  strict aspiration and reflux precautions.  Aspiration risk  remains even with modified diet.   Diagnostic treatment completed  following evaluation focusing on providing education to  caregivers on swallow strategies to maximize safety.   Patient  may benefit from GI consult to assess current esophageal  functioning.  ST to follow in acute care setting for diet  tolerance and POC.   Recommend repeat MBS following results of GI  consult.         Diet Recommendation Dysphagia 1 (Puree);Nectar-thick liquid   Liquid Administration via: Straw;Cup Medication Administration: Via  alternative means Supervision: Patient able to self feed;Full supervision/cueing  for compensatory strategies Compensations: Slow rate;Small sips/bites;Multiple dry swallows  after each bite/sip;Clear throat intermittently;Hard cough after  swallow;Effortful swallow Postural Changes and/or Swallow Maneuvers: Seated upright 90  degrees;Upright 30-60 min after meal    Other  Recommendations Recommended Consults: Consider  GI  evaluation Oral Care Recommendations: Oral care before and after PO Other Recommendations: Order thickener from pharmacy;Prohibited  food (jello, ice cream, thin soups);Have oral suction  available;Clarify dietary restrictions   Follow Up Recommendations  Outpatient SLP    Frequency and Duration min 2x/week  2 weeks       SLP Swallow Goals Patient will utilize recommended strategies during swallow to  increase swallowing safety with: Total assistance   General Date of Onset: 11/26/12 HPI: Kol Consuegra is a 79 y.o. male who was just discharged  from our service 6 days ago after an admission for a UTI with  pseudomonas.  Since discharge he has getting progressively short  of breath until today he developed cough, productive of a whitish  sputum.  He has had no fever, no chills, no sweating since Monday  of this week.  In the ED while his WBC is elevated at 17k this is  significantly down from the >30k it was at discharge 6 days ago.   At the time of discharge he had been taken off of his Zaroxylyn  and had not yet restarted this, he did restart his lasix on  Tuesday. Type of Study: Modified Barium Swallowing Study Reason for Referral: Objectively evaluate swallowing function Previous Swallow Assessment: BSE 11/26/12 Diet Prior to this Study: NPO Temperature Spikes Noted: No Respiratory Status: Supplemental O2 delivered via (comment) History of Recent Intubation: No Behavior/Cognition: Alert;Cooperative;Hard of hearing;Requires  cueing Oral Cavity - Dentition: Missing dentition Oral Motor / Sensory Function: Impaired - see Bedside swallow  eval Self-Feeding Abilities: Able to feed self;Needs assist;Needs set  up Patient Positioning: Upright in chair Baseline Vocal Quality: Breathy;Hoarse;Low vocal intensity Volitional Cough: Strong Volitional Swallow: Able to elicit Pharyngeal Secretions: Not observed secondary MBS    Reason for Referral Objectively evaluate swallowing function   Oral Phase Oral Preparation/Oral  Phase Oral Phase: Impaired Oral - Nectar Oral - Nectar Teaspoon: Weak lingual manipulation;Lingual  pumping;Incomplete tongue to palate contact;Holding of  bolus;Reduced posterior propulsion;Lingual/palatal  residue;Piecemeal swallowing;Delayed oral transit Oral - Nectar Cup: Weak lingual manipulation;Lingual  pumping;Incomplete tongue to palate contact;Reduced posterior  propulsion;Holding of bolus;Lingual/palatal residue;Piecemeal  swallowing Oral - Nectar Straw: Weak lingual manipulation;Lingual  pumping;Incomplete tongue to palate contact;Reduced posterior  propulsion;Holding of bolus;Piecemeal swallowing;Lingual/palatal  residue;Delayed oral transit Oral - Thin Oral - Thin Teaspoon: Weak lingual manipulation;Lingual  pumping;Incomplete tongue to palate contact;Impaired  mastication;Lingual/palatal residue;Piecemeal swallowing;Holding  of bolus;Reduced posterior propulsion;Delayed oral transit Oral - Thin Cup: Weak lingual manipulation;Lingual  pumping;Incomplete tongue to palate contact;Reduced posterior  propulsion;Holding of bolus;Lingual/palatal residue;Piecemeal  swallowing;Delayed oral transit Oral - Thin Straw: Weak lingual manipulation;Lingual  pumping;Incomplete tongue to palate contact;Reduced posterior  propulsion;Holding of bolus;Piecemeal swallowing;Lingual/palatal  residue Oral - Solids Oral - Puree: Weak lingual manipulation;Lingual pumping;Holding  of bolus;Reduced posterior propulsion;Lingual/palatal  residue;Piecemeal swallowing;Delayed oral transit Oral - Mechanical Soft: Impaired mastication;Weak lingual  manipulation;Lingual pumping;Incomplete tongue to palate  contact;Piecemeal swallowing;Lingual/palatal residue;Delayed oral  transit   Pharyngeal Phase Pharyngeal Phase Pharyngeal Phase: Impaired Pharyngeal - Nectar Pharyngeal - Nectar Teaspoon: Premature spillage to  valleculae;Delayed swallow initiation;Premature spillage to  pyriform sinuses;Reduced pharyngeal peristalsis;Reduced anterior   laryngeal mobility;Reduced laryngeal elevation;Reduced  airway/laryngeal closure;Reduced tongue  base  retraction;Penetration/Aspiration during  swallow;Penetration/Aspiration after swallow;Pharyngeal residue -  pyriform sinuses;Pharyngeal residue - valleculae;Pharyngeal  residue - cp segment Penetration/Aspiration details (nectar teaspoon): Material enters  airway, CONTACTS cords then ejected out Pharyngeal - Nectar Cup: Premature spillage to  valleculae;Premature spillage to pyriform sinuses;Delayed swallow  initiation;Reduced pharyngeal peristalsis;Reduced anterior  laryngeal mobility;Reduced laryngeal elevation;Reduced  airway/laryngeal closure;Reduced tongue base  retraction;Pharyngeal residue - pyriform sinuses;Pharyngeal  residue - valleculae;Pharyngeal residue - cp segment Pharyngeal - Thin Pharyngeal - Thin Teaspoon: Delayed swallow initiation;Premature  spillage to valleculae;Premature spillage to pyriform  sinuses;Reduced pharyngeal peristalsis;Reduced anterior laryngeal  mobility;Reduced tongue base retraction;Reduced airway/laryngeal  closure;Penetration/Aspiration after swallow;Pharyngeal residue -  pyriform sinuses;Pharyngeal residue - valleculae;Pharyngeal  residue - cp segment Penetration/Aspiration details (thin teaspoon): Material enters  airway, passes BELOW cords and not ejected out despite cough  attempt by patient Pharyngeal - Thin Cup: Premature spillage to pyriform  sinuses;Delayed swallow initiation;Reduced pharyngeal  peristalsis;Reduced anterior laryngeal mobility;Reduced laryngeal  elevation;Reduced airway/laryngeal closure;Reduced tongue base  retraction;Penetration/Aspiration after swallow;Trace  aspiration;Pharyngeal residue - valleculae;Pharyngeal residue -  pyriform sinuses;Pharyngeal residue - posterior  pharnyx;Pharyngeal residue - cp segment Penetration/Aspiration details (thin cup): Material enters  airway, passes BELOW cords and not ejected out despite cough  attempt by patient  Pharyngeal - Thin Straw: Premature spillage to pyriform  sinuses;Reduced pharyngeal peristalsis;Reduced anterior laryngeal  mobility;Reduced laryngeal elevation;Reduced airway/laryngeal  closure;Reduced tongue base retraction;Penetration/Aspiration  after swallow;Pharyngeal residue - pyriform sinuses;Pharyngeal  residue - posterior pharnyx;Pharyngeal residue - cp  segment;Pharyngeal residue - valleculae Penetration/Aspiration details (thin straw): Material enters  airway, passes BELOW cords and not ejected out despite cough  attempt by patient Pharyngeal - Solids Pharyngeal - Puree: Premature spillage to valleculae;Reduced  pharyngeal peristalsis;Reduced anterior laryngeal  mobility;Reduced tongue base retraction;Reduced airway/laryngeal  closure;Reduced laryngeal elevation;Pharyngeal residue - pyriform  sinuses;Pharyngeal residue - posterior pharnyx;Pharyngeal residue  - valleculae;Pharyngeal residue - cp segment Pharyngeal - Mechanical Soft: Premature spillage to  valleculae;Reduced pharyngeal peristalsis;Reduced anterior  laryngeal mobility;Reduced laryngeal elevation;Reduced  airway/laryngeal closure;Reduced tongue base  retraction;Penetration/Aspiration after swallow;Pharyngeal  residue - posterior pharnyx;Pharyngeal residue - cp  segment;Pharyngeal residue - pyriform sinuses;Pharyngeal residue  - valleculae Penetration/Aspiration details (mechanical soft): Material enters  airway, passes BELOW cords then ejected out  Cervical Esophageal Phase    GO    Cervical Esophageal Phase Cervical Esophageal Phase: Impaired Cervical Esophageal Phase - Nectar Nectar Cup: Reduced cricopharyngeal relaxation;Prominent  cricopharyngeal segment;Esophageal backflow into cervical  esophagus Nectar Straw: Prominent cricopharyngeal segment;Esophageal  backflow into cervical esophagus Cervical Esophageal Phase - Thin Thin Cup: Reduced cricopharyngeal relaxation;Prominent  cricopharyngeal segment;Esophageal backflow into cervical   esophagus;Esophageal backflow into the pharynx Cervical Esophageal Phase - Solids Puree: Prominent cricopharyngeal segment;Esophageal backflow into  cervical esophagus;Reduced cricopharyngeal relaxation Mechanical Soft: Reduced cricopharyngeal relaxation;Prominent  cricopharyngeal segment        Moreen Fowler MS, CCC-SLP [phone] [facility] 11/27/2012, 5:25 PM     Scheduled Meds: . antiseptic oral rinse  15 mL Mouth Rinse q12n4p  . chlordiazePOXIDE  5 mg Oral TID  . chlorhexidine  15 mL Mouth Rinse BID  . Chlorhexidine Gluconate Cloth  6 each Topical Q0600  . DULoxetine  30 mg Oral QHS  . famotidine  20 mg Oral QHS  . free water  200 mL Per Tube Q6H  . insulin aspart  0-15 Units Subcutaneous Q4H  . isosorbide dinitrate  15 mg Per Tube BID  . multivitamin  5 mL Per Tube Daily  . mupirocin ointment  1 application Nasal BID  . senna-docusate  1 tablet  Oral BID  . warfarin  2.5 mg Per Tube ONCE-1800  . Warfarin - Pharmacist Dosing Inpatient   Does not apply q1800   Continuous Infusions: . sodium chloride 500 mL (12/02/12 1412)  . feeding supplement (JEVITY 1.2 CAL) 1,000 mL (12/04/12 0600)    Time spent: 35 minutes with > 50% of time discussing current diagnostic test results, clinical impression and plan of care with the patient's wife.    LOS: 9 days   Zhamir Pirro  [facility] Pager [phone].   *Please note that the hospitalists switch teams on Wednesdays. Please call the flow manager at [phone] if you are having difficulty reaching the hospitalist taking care of this patient as she can update you and provide the most up-to-date pager number of provider caring for the patient. If 8PM-8AM, please contact night-coverage at [email or web address], password [facility]  12/04/2012, 9:28 AM

## 2012-12-05 LAB — GLUCOSE, CAPILLARY
Glucose-Capillary: 227 mg/dL — ABNORMAL HIGH (ref 70–99)
Glucose-Capillary: 247 mg/dL — ABNORMAL HIGH (ref 70–99)
Glucose-Capillary: 260 mg/dL — ABNORMAL HIGH (ref 70–99)
Glucose-Capillary: 270 mg/dL — ABNORMAL HIGH (ref 70–99)
Glucose-Capillary: 287 mg/dL — ABNORMAL HIGH (ref 70–99)

## 2012-12-05 LAB — PROTIME-INR
INR: 1.92 — ABNORMAL HIGH (ref 0.00–1.49)
Prothrombin Time: 21.4 seconds — ABNORMAL HIGH (ref 11.6–15.2)

## 2012-12-05 MED ORDER — INSULIN ASPART 100 UNIT/ML ~~LOC~~ SOLN
0.0000 [IU] | SUBCUTANEOUS | Status: DC
  Administered 2012-12-05: 7 [IU] via SUBCUTANEOUS
  Administered 2012-12-05 (×2): 11 [IU] via SUBCUTANEOUS
  Administered 2012-12-06 (×6): 7 [IU] via SUBCUTANEOUS
  Administered 2012-12-07 (×4): 4 [IU] via SUBCUTANEOUS
  Administered 2012-12-07: 11 [IU] via SUBCUTANEOUS

## 2012-12-05 MED ORDER — WARFARIN SODIUM 5 MG PO TABS
5.0000 mg | ORAL_TABLET | Freq: Once | ORAL | Status: AC
  Administered 2012-12-05: 5 mg
  Filled 2012-12-05: qty 1

## 2012-12-05 MED ORDER — FUROSEMIDE 10 MG/ML IJ SOLN
40.0000 mg | Freq: Once | INTRAMUSCULAR | Status: AC
  Administered 2012-12-05: 40 mg via INTRAVENOUS
  Filled 2012-12-05: qty 4

## 2012-12-05 MED ORDER — NYSTATIN 100000 UNIT/GM EX CREA
TOPICAL_CREAM | Freq: Two times a day (BID) | CUTANEOUS | Status: DC
  Administered 2012-12-05 – 2012-12-07 (×5): via TOPICAL
  Filled 2012-12-05: qty 15

## 2012-12-05 MED ORDER — INSULIN GLARGINE 100 UNIT/ML ~~LOC~~ SOLN
20.0000 [IU] | Freq: Every day | SUBCUTANEOUS | Status: DC
  Administered 2012-12-05: 20 [IU] via SUBCUTANEOUS
  Filled 2012-12-05: qty 0.2

## 2012-12-05 MED ORDER — POTASSIUM CHLORIDE 20 MEQ/15ML (10%) PO LIQD
20.0000 meq | Freq: Two times a day (BID) | ORAL | Status: AC
  Administered 2012-12-05 (×2): 20 meq
  Filled 2012-12-05 (×2): qty 15

## 2012-12-05 NOTE — Progress Notes (Signed)
[facility] PROGRESS NOTE  Frederick [name] ZOX:[id number] DOB: 26-Jul-1929 DOA: 11/25/2012 PCP: Georgianne Fick, MD  Brief narrative: Satish Hammers is an 79 y.o. male with a PMH of DM, hypertension, hyperlipidemia, coronary artery disease, atrial fibrillation, peripheral vascular disease, recent hospitalization for treatment of pseudomonal UTI who was admitted on 11/26/2012 with cough and chest x-ray findings of pulmonary edema a left-sided pleural effusion concerning for decompensated heart failure. On 11/28/2012, the patient's mental status became significantly altered with somnolence, and he was found to be hypercarbic and hypoxic with worsening respiratory failure, ultimately requiring intubation. He was successfully extubated on 12/02/2012. On 12/03/2012, a speech therapy evaluation was performed and he was found to have significant dysphasia necessitating placement of a feeding tube for tube feeds.  Assessment/Plan: Principal Problem:   Acute respiratory failure with hypoxia -Multifactorial with aspiration pneumonia and CHF contributory. -Intubated 11/28/2012-12/02/2012. -Continue pulmonary toilet and increase mobilization, respiratory status now improving. Active Problems:   Hyponatremia -Add free water to tube feeds, 200 cc per tube every 6 hours. -Re-check BMET 12/06/12.   DIABETES MELLITUS, TYPE II, ON INSULIN -Currently on SSI before meals. Currently on SSI coverage Q 4 hours, moderate scale Lantus, 10 units daily. -CBGs 227-287.  Will change SSI  to insulin resistant scale every 4 hours and increased Lantus to 20 units daily.   HYPERTENSION -Continue hydralazine as needed. -Continue Isordil, Avapro.   History of ESOPHAGEAL STRICTURE, dysphagia -Failed swallowing evaluation on 12/03/2012. -Will need GI evaluation to determine if esophageal stricture playing a role in his current dysphasia and if so, if there is a recommended treatment.  Discussed with Dr. Loreta Ave.  Not stable  enough for EGD at present, and with relative stability of his stricture (not needed esophageal dilatation in several years), Dr. Loreta Ave does not feel this is the likely etiology of his stricture. -Panda tube placed with initiation of tube feeding per dietitian recommendations.  Tolerating well.   GERD -Continue Pepcid.   Atrial fibrillation, permanent -Rate controlled, on chronic Coumadin.   Coronary artery disease, last cath 2008 occluded nondom, LCX, wth moderate ostial RCA disease, normal LV function -Continue Isordil and Coumadin.   Diastolic CHF -Status post cardiology consultation done on 11/26/2012. Recommended a more aggressive diuretic regimen upon discharge. -Monitor intake and output closely, daily weights, and diurese as needed. I and O. balance positive by 1.3 L, given a dose of Lasix today. -Two-dimensional echocardiogram ordered. Followup results. ARB resumed.   Acute renal failure -Resolved.   Hypokalemia -Monitor and replace electrolytes as needed.   Septic shock secondary to Aspiration pneumonia -Completed 4 days of therapy with vancomycin and 5 days of Zosyn. -Blood and urine cultures negative. Tracheal aspirate grew a few colonies of yeast.   Delirium/toxic encephalopathy/Altered mental status -No history of advanced dementia, as initially reported. -Likely toxic encephalopathy from acute illness and hypoxia/hypercarbia. -Still having periods of confusion per wife, with sundowning. -Has safety mittens on.  Code Status: Partial Family Communication: Nephew updated at bedside. Disposition Plan: SNF.   Medical Consultants:  Dr. Kalman Shan, Pulmonology.  Dr. Nanetta Batty, Cardiology  Other Consultants:  Diabetes coordinator  Dietician  Anti-infectives:  Vanc 8/24>>>8/27 (rising creat)   Zosyn 8/24>>>8/28   HPI/Subjective: Frederick [name] is awake today, following commands but difficult to understand secondary to weakness. Has some dyspnea, but  no complaints of pain.  Objective: Filed Vitals:   12/04/12 0540 12/04/12 1545 12/04/12 2045 12/05/12 0500  BP: 157/88 152/78 158/88 148/85  Pulse: 93 100 94 92  Temp:  98.2 F (36.8 C) 98.2 F (36.8 C) 96.4 F (35.8 C) 98.4 F (36.9 C)  TempSrc: Oral Axillary Axillary Oral  Resp: 20 20 20 20   Height:      Weight: 100.3 kg (221 lb 1.9 oz)     SpO2: 98% 96% 99% 92%    Intake/Output Summary (Last 24 hours) at 12/05/12 0715 Last data filed at 12/05/12 0600  Gross per 24 hour  Intake 1986.17 ml  Output    600 ml  Net 1386.17 ml    Exam: Gen:  Awake, alert Cardiovascular:  Heart sounds are irregular  Respiratory:  Lungs diminished with scattered rhonchi Gastrointestinal:  Abdomen soft, NT/ND, + BS Extremities:  1+ edema  Data Reviewed: Basic Metabolic Panel:  Recent Labs Lab 11/29/12 0445 11/30/12 0400  12/01/12 0437 12/02/12 0330 12/02/12 1030 12/03/12 0400 12/03/12 2328 12/04/12 0515  NA 145 140  < > 143  --  143 147* 143 146*  K 3.3* 2.8*  < > 3.8  --  2.9* 3.8 4.1 3.9  CL 100 98  < > 102  --  107 112 108 109  CO2 35* 34*  < > 30  --  31 30 31 30   GLUCOSE 192* 258*  < > 121*  --  178* 157* 221* 300*  BUN 41* 43*  < > 62*  --  28* 21 19 20   CREATININE 1.41* 1.14  < > 2.02*  --  1.00 0.89 0.84 0.83  CALCIUM 8.9 8.7  < > 9.7  --  8.9 9.4 9.1 9.2  MG 1.8 1.9  --  2.1 2.0  --   --   --  1.9  PHOS 2.0* 3.1  --  3.6 2.4  --   --   --  2.2*  < > = values in this interval not displayed. GFR Estimated Creatinine Clearance: 80.2 ml/min (by C-G formula based on Cr of 0.83). Liver Function Tests: No results found for this basename: AST, ALT, ALKPHOS, BILITOT, PROT, ALBUMIN,  in the last 168 hours No results found for this basename: LIPASE, AMYLASE,  in the last 168 hours No results found for this basename: AMMONIA,  in the last 168 hours Coagulation profile  Recent Labs Lab 11/30/12 0400 12/01/12 0437 12/02/12 0330 12/03/12 0400 12/04/12 0515  INR 1.82* 1.05  2.29* 2.21* 2.68*    CBC:  Recent Labs Lab 11/29/12 0445 11/30/12 0400 12/01/12 0437 12/03/12 0400 12/04/12 0515  WBC 13.6* 11.4* 12.6* 13.9* 13.3*  NEUTROABS  --   --   --  9.5* 10.6*  HGB 11.3* 10.0* 10.2* 9.8* 10.1*  HCT 36.2* 32.0* 32.0* 31.8* 33.5*  MCV 94.5 94.7 94.1 98.1 98.5  PLT 387 352 256 340 342   Cardiac Enzymes:  Recent Labs Lab 12/01/12 1200 12/02/12 0330  CKTOTAL 44 39  CKMB 3.6  --    BNP (last 3 results)  Recent Labs  11/27/12 0538 12/02/12 0330 12/04/12 0515  PROBNP 1393.0* 1778.0* 3470.0*   CBG:  Recent Labs Lab 12/04/12 1217 12/04/12 1728 12/04/12 2028 12/05/12 0039 12/05/12 0410  GLUCAP 265* 286* 245* 227* 287*   Lipid Profile  Recent Labs  12/03/12 0400  TRIG 125   Microbiology Recent Results (from the past 240 hour(s))  MRSA PCR SCREENING     Status: Abnormal   Collection Time    11/26/12  4:51 AM      Result Value Range Status   MRSA by PCR POSITIVE (*) NEGATIVE Final   Comment:  The GeneXpert MRSA Assay (FDA     approved for NASAL specimens     only), is one component of a     comprehensive MRSA colonization     surveillance program. It is not     intended to diagnose MRSA     infection nor to guide or     monitor treatment for     MRSA infections.     RESULT CALLED TO, READ BACK BY AND VERIFIED WITH:     A. MOORE RN AT 0454 ON 08.22.14 BY SHUEA  URINE CULTURE     Status: None   Collection Time    11/28/12 10:40 AM      Result Value Range Status   Specimen Description URINE, CATHETERIZED   Final   Special Requests NONE   Final   Culture  Setup Time     Final   Value: 11/28/2012 15:47     Performed at [facility] Count     Final   Value: NO GROWTH     Performed at [facility]   Culture     Final   Value: NO GROWTH     Performed at [facility]   Report Status 11/29/2012 FINAL   Final  CULTURE, BLOOD (ROUTINE X 2)     Status: None   Collection Time     11/28/12 11:00 AM      Result Value Range Status   Specimen Description BLOOD LEFT ARM   Final   Special Requests BOTTLES DRAWN AEROBIC AND ANAEROBIC 6 CC EA   Final   Culture  Setup Time     Final   Value: 11/28/2012 19:51     Performed at [facility]   Culture     Final   Value: NO GROWTH 5 DAYS     Performed at [facility]   Report Status 12/04/2012 FINAL   Final  CULTURE, BLOOD (ROUTINE X 2)     Status: None   Collection Time    11/28/12 11:11 AM      Result Value Range Status   Specimen Description BLOOD LEFT ARM   Final   Special Requests BOTTLES DRAWN AEROBIC AND ANAEROBIC 6 CC EA   Final   Culture  Setup Time     Final   Value: 11/28/2012 19:51     Performed at [facility]   Culture     Final   Value: NO GROWTH 5 DAYS     Performed at [facility]   Report Status 12/04/2012 FINAL   Final  CULTURE, RESPIRATORY (NON-EXPECTORATED)     Status: None   Collection Time    11/28/12 11:35 AM      Result Value Range Status   Specimen Description TRACHEAL ASPIRATE   Final   Special Requests NONE   Final   Gram Stain     Final   Value: FEW WBC PRESENT, PREDOMINANTLY PMN     RARE SQUAMOUS EPITHELIAL CELLS PRESENT     RARE GRAM POSITIVE COCCI IN PAIRS     Performed at [facility]   Culture     Final   Value: FEW CANDIDA ALBICANS     Performed at [facility]   Report Status 12/01/2012 FINAL   Final  CLOSTRIDIUM DIFFICILE BY PCR     Status: None   Collection Time    12/01/12 11:26 PM      Result Value Range  Status   C difficile by pcr NEGATIVE  NEGATIVE Final   Comment: Performed at [facility]     Procedures and Diagnostic Studies: Ct Abdomen Pelvis Wo Contrast  11/16/2012   *RADIOLOGY REPORT*  Clinical Data: Urinary tract infection with sepsis.  Evaluate for potential pyelonephritis.  Fever.  CT ABDOMEN AND PELVIS WITHOUT CONTRAST  Technique:  Multidetector CT imaging of the abdomen and pelvis was performed  following the standard protocol without intravenous contrast.  Comparison: CT of the abdomen and pelvis 10/14/2012.  Findings:  Lung Bases: Probable scarring in the left lung base.  Cardiomegaly. Atherosclerotic calcifications in the left main, left anterior descending, left circumflex and right coronary arteries. Calcifications of the aortic valve.  Moderate sized hiatal hernia. Calcified pleural plaques in the posterior aspect of the left hemithorax.  Abdomen/Pelvis:  Small calcified gallstones layering dependently in the gallbladder.  No current findings to suggest acute cholecystitis at this time.  Severe pancreatic atrophy.  Status post splenectomy.  Surgical clips near the gastroesophageal junction.  The unenhanced appearance of the right kidney and bilateral adrenal glands is unremarkable.  An exophytic low attenuation lesion measuring 3.7 cm in diameter extending from the posterior aspect of the left kidney is incompletely characterized on today's noncontrast CT examination, but was previously characterized as a simple cyst.  A left-sided double J ureteral stent with the proximal loop properly reformed within the left renal pelvis and the distal loop properly reformed within the lumen of the urinary bladder.  Numerous colonic diverticula, without surrounding inflammatory changes to suggest acute diverticulitis at this time.  No significant volume of ascites.  No pneumoperitoneum.  No pathologic distension of small bowel.  No definite pathologic lymphadenopathy identified within the abdomen or pelvis.  Foley balloon catheter in place with tip in the lumen of the urinary bladder.  Small amount of gas, dependently in the lumen of the urinary bladder presumably iatrogenic. Urinary bladder wall appears thickened, however, this is likely accentuated by under distension of the urinary bladder. Small amount of stranding in the fat adjacent to the urinary bladder.  Musculoskeletal: There are no aggressive appearing  lytic or blastic lesions noted in the visualized portions of the skeleton.  IMPRESSION: 1. Thickening of the urinary bladder wall with a small amount of soft tissue stranding in the adjacent fat surrounding the urinary bladder may suggest cystitis.  Clinical correlation is recommended.  2.  No imaging findings on today's noncontrast CT examination to strongly suggest presence of pyelonephritis, however, clinical correlation and urinalysis is recommended if there is suspicion for pyelonephritis. 3.  Left-sided double J ureteral stent appears properly located. 4.  Cholelithiasis without evidence to suggest acute cholecystitis at this time. 5.  Colonic diverticulosis without findings to suggest acute diverticulitis at this time. 6.  Status post splenectomy. 7.  Moderate hiatal hernia. 8.  Atherosclerosis, including left main and three post coronary artery disease. 9.  Additional incidental findings, as above.   Original Report Authenticated By: Trudie Reed, M.D.   Dg Chest 2 View  11/25/2012   *RADIOLOGY REPORT*  Clinical Data: Cough and chest congestion.  CHEST - 2 VIEW  Comparison: 11/15/2012  Findings: Shallow inspiration.  Mild cardiac enlargement with pulmonary vascular congestion and central interstitial changes suggesting edema.  This is progressing since the previous study. There is superimposed infiltration or atelectasis in the left lung base.  Small left pleural effusion is not excluded.  No pneumothorax.  Calcified and tortuous aorta.  Degenerative changes in the  spine and shoulders.  Surgical clips in the left upper quadrant.  Left ureteral stent.  IMPRESSION: Cardiac enlargement with developing pulmonary vascular congestion and perihilar edema.  Infiltration or atelectasis in the left lung base.  Possible left pleural effusion.   Original Report Authenticated By: Burman Nieves, M.D.   Dg Chest 2 View  11/15/2012   *RADIOLOGY REPORT*  Clinical Data: Chest pain.  Shortness of breath.  Weakness.   CHEST - 2 VIEW  Comparison: Chest x-ray 10/12/2012.  Findings: Lung volumes are low.  Film is under penetrated, which limits the Diagnostic sensitivity and specificity of this examination.  With these limitations in mind, there are bibasilar opacities (left greater than right), favored to represent subsegmental atelectasis.  No definite pleural effusions. Cephalization of the pulmonary vasculature, with evidence of very mild interstitial pulmonary edema.  Mild cardiomegaly. Retrocardiac density compatible with a moderate sized hiatal hernia. The patient is rotated to the left on today's exam, resulting in distortion of the mediastinal contours and reduced diagnostic sensitivity and specificity for mediastinal pathology. Atherosclerosis of the thoracic aorta. Multiple surgical clips are noted projecting over the upper abdomen.  There appears to be a left-sided double J ureteral stent in position, however this is incompletely visualized.  IMPRESSION: 1.  Findings, as above, concerning for early congestive heart failure. 2.  Atherosclerosis. 3.  Moderate hiatal hernia. 4.  Postoperative changes, as above.   Original Report Authenticated By: Trudie Reed, M.D.   Dg Abd 1 View  12/03/2012   *RADIOLOGY REPORT*  Clinical Data: Panda placement confirmation.  ABDOMEN - 1 VIEW  Comparison: CT abdomen pelvis 11/15/2012  Findings: Enteric tube is seen with the tip overlying the expected location of the proximal stomach. Proximal and mid portions of a left double J ureteral stent are seen and appear properly positioned. Surgical clips are again noted in the left upper quadrant.  Nonobstructive bowel gas pattern. No acute osseous abnormality.  IMPRESSION: 1. Enteric tube tip overlying the stomach.  2. No acute findings.   Original Report Authenticated By: Jerene Dilling, M.D.   Dg Chest Port 1 View  12/02/2012   *RADIOLOGY REPORT*  Clinical Data: Endotracheal tube position.  PORTABLE CHEST - 1 VIEW  Comparison:  December 01, 2012.  Findings: Endotracheal tube is in grossly good position with distal tip 2 cm above the carina.  Stable cardiomegaly is noted.  No change is noted in position of right internal jugular catheter. Mild central pulmonary vascular congestion is again noted.  Left basilar opacity is again noted and unchanged concerning for atelectasis and possible associated pleural effusion.  IMPRESSION: Endotracheal tube in grossly good position.  Stable left basilar opacity.   Original Report Authenticated By: Lupita Raider.,  M.D.   Dg Chest Port 1 View  12/01/2012   *RADIOLOGY REPORT*  Clinical Data: Evaluate endotracheal tube  PORTABLE CHEST - 1 VIEW  Comparison: Prior chest x-ray 11/27/2012  Findings: The tip of the endotracheal tube is 1.7 cm above the carina.  Stable position of right IJ central venous catheter with the tip at the superior cavoatrial junction.  The tip of the nasogastric tube projects over the stomach. Slightly improved left lower lobe collapse.  Persistent bilateral layering effusions and associated bibasilar opacities.  Multiple surgical clips project over the left upper quadrant and mid epigastric region. Incompletely imaged left double-J ureteral stent.  Stable cardiomegaly and aortic atherosclerosis.  IMPRESSION:  1.  Slightly improved left lower lobe atelectasis/collapse. 2.  Persistent left greater than right  layering pleural effusions and associated bibasilar atelectasis versus infiltrate 3.  Stable support apparatus.  The tip of the endotracheal tube is 1.7 cm above the carina.   Original Report Authenticated By: Malachy Moan, M.D.   Dg Chest Port 1 View  11/30/2012   *RADIOLOGY REPORT*  Clinical Data: Atelectasis, endotracheal tube placement.  PORTABLE CHEST - 1 VIEW  Comparison: 11/29/2012.  Findings: Endotracheal tube terminates approximately 1.7 cm above the carina.  Nasogastric tube is followed into the stomach.  Right IJ central line tip projects over the SVC.  Heart  size is grossly stable.  Left lower lobe collapse/consolidation and left pleural effusion persist.  Lungs are overall low in volume with mild right basilar air space disease as well.  Surgical clips are seen in the upper abdomen.  IMPRESSION:  1.  Persistent left lower lobe collapse/consolidation.  Follow-up to clearing is recommended. 2.  Mild bibasilar air space disease.   Original Report Authenticated By: Leanna Battles, M.D.   Dg Chest Port 1 View  11/29/2012   *RADIOLOGY REPORT*  Clinical Data: Respiratory failure.  PORTABLE CHEST - 1 VIEW  Comparison: 11/28/2012  Findings: Endotracheal tube remains with the tip approximately 2 cm above the carina.  Central line and nasogastric tube positioning are stable.  Lungs show relatively stable left lower lobe atelectasis.  No pulmonary edema or pleural effusions are identified.  Heart size is stable.  IMPRESSION: Stable left lower lobe atelectasis.   Original Report Authenticated By: Irish Lack, M.D.   Dg Chest Port 1 View  11/28/2012   *RADIOLOGY REPORT*  Clinical Data: Respiratory distress.  Intubation, central venous catheter placement and nasogastric tube placement.  PORTABLE CHEST - 1 VIEW 11/28/2012 1029 hours:  Comparison: Portable chest x-ray earlier same day 0851 hours.  Findings: Endotracheal tube tip projects approximately 2 cm above carina.  Right jugular central venous catheter tip projects over the lower SVC.  No evidence of pneumothorax mediastinal hematoma. Nasogastric tube tip in the fundus the stomach.  Cardiac silhouette enlarged but stable.  Mild pulmonary venous hypertension, improved since earlier in the morning, without overt edema currently. Slight improved aeration in the left lower lobe, though moderate consolidation persist.  No new pulmonary parenchymal abnormalities.  IMPRESSION:  1.  Endotracheal tube tip approximately 2 cm above the carina. 2.  Right jugular central venous catheter tip in the lower SVC.  No acute complicating  features. 3.  Nasogastric tube tip in the gastric fundus. 4.  Interval resolution of interstitial pulmonary edema since earlier same date.  Improved aeration in the left lower lobe, though moderate atelectasis and/or pneumonia persists.  No new abnormalities.   Original Report Authenticated By: Hulan Saas, M.D.   Dg Chest Port 1v Same Day  11/28/2012   *RADIOLOGY REPORT*  Clinical Data: Lethargy.  Respiratory distress.  PORTABLE CHEST - 1 VIEW SAME DAY  Comparison: 11/25/2012  Findings: The study is somewhat limited by respiratory motion, low lung volumes and a semi-erect rotated positioning.  Left lung base opacity noted previously is stable most likely atelectasis although infiltrate is possible.  There is some central vascular congestion. Mild right perihilar airspace opacity is suggested which may reflect asymmetric edema.  Cardiac silhouette is mildly enlarged.  No pneumothorax is appreciated.  IMPRESSION: Allowing for differences in patient positioning and technique, findings are similar to the prior exam.  There may be mild pulmonary edema although this appearance is accentuated by low lung volumes and motion artifact.  Left lung base opacities most likely  atelectasis.   Original Report Authenticated By: Amie Portland, M.D.   Dg Swallowing Func-speech Pathology  11/27/2012   Lacinda Axon, CCC-SLP     11/27/2012  6:07 PM Objective Swallowing Evaluation: Modified Barium Swallowing Study   Patient Details  Name: Lola Lofaro MRN: [id number] Date of Birth: [birth year]  Today's Date: 11/27/2012 Time: 1700-1730 SLP Time Calculation (min): 30 min  Past Medical History:  Past Medical History  Diagnosis Date  . Diabetes mellitus   . Hypertension   . Hypercholesterolemia   . Esophageal stricture   . Pulmonary embolism 2008    chronic coumadin   . Coronary artery disease     DR. BERRY IS PT'S CARDIOLOGIST  . Atrial fibrillation     CHRONIC COUMADIN-pemanent atrial fib  . Anxiety     SINCE 1975   . Depression      SINCE 1975    . Shortness of breath     NOT SURE WHAT CAUSES SOB - BUT HE IS NOT ABLE TO BE ACTIVE -  AND IS SOB WITH ANY ACTIVITY - USES OXYGEN 2 L NASAL CANNULA -  ALL THE TIME -EXCEPT WHEN IN SHOWER OR CHANGING CLOTHES on home  02  . Peripheral vascular disease     TOLD SOME SMALL AMOUNT OF BLOCKAGE IN LEG WHEN HEART CATH DONE  2008  . Bladder tumor     HAS HAD HEMATURIA OFF AND ON - HAS FOLEY CATH THAT WAS PLACED  JUNE 20TH, 2014  . GERD (gastroesophageal reflux disease)   . Edema     FEET AND LEGS MOST DAYS - SOMETIMES WEARS COMPRESSION HOSE  . Cancer     SKIN CANCERS  . Arthritis     S/P BILATERAL TOTAL KNEE REPLACEMENTS - BUT BOTH KNEES PAINFUL  AND JOINTS WORN OUT; BAD RIGHT HIP - BUT NOT A CANDIDATE FOR HIP  PREPLACMENT;  HAS SEVERE LOWER  BACK AND LEG PAIN - HAS SPINAL  STENOSIS AND BULGING DISC; CURVATURE OF UPPER SPINE - UNABLE TO  LAY HIS HEAD FLAT.  Marland Kitchen Pneumonia 2005  . Difficult intubation     DUE TO LIMITED NECK FLEXION AND SHORT NECK--ANESTHESIA RECORD  FROM 2007 VATS SURGERY AT [facility] OBTAINED AND ON PT'S CHART.  Marland Kitchen Problems with hearing     WEARS BILATERAL HEARING AIDS   Past Surgical History:  Past Surgical History  Procedure Laterality Date  . Shoulder surgery      bilateral  . Pancreas surgery  2001    TUMOR REMOVED FROM PANCREAS AND SPLEEN ALSO REMOVED  . Total knee arthroplasty      bilateral  . Lung surgery  2007    non-cancer  VIDEO ASSISTED THORCOTOMY TO REMOVE FLUID /  DECORTICATION. DR. Edwyna Shell  . Basil cell    . Cardiac catheterization  2008    totally occluded nondominant LCX wth mod. ostial RCA disease  and nl. EF  . Esophageal stretch    . Skin cancer removed from left side of head - required skin  graft from left leg    . Cartilage repair 198- left knee    . Joint replacement    . Transurethral resection of bladder tumor with gyrus  (turbt-gyrus) N/A 11/03/2012    Procedure: TRANSURETHRAL RESECTION OF BLADDER TUMOR WITH GYRUS  (TURBT-GYRUS);  Surgeon: Milford Cage, MD;   Location: [facility]  [facility];  Service: Urology;  Laterality: N/A;  . Cystoscopy/retrograde/ureteroscopy  11/03/2012    Procedure: CYSTOSCOPY/RETROGRADE/ LEFT URETEROSCOPY AND STENT  PLACEMENT;  Surgeon: Milford Cage, MD;  Location: [facility]  [facility];  Service: Urology;;  . Theador Hawthorne N/A 11/03/2012    Procedure: CYSTOGRAM;  Surgeon: Milford Cage, MD;   Location: [facility];  Service: Urology;  Laterality: N/A;   HPI:  Abdulah Iqbal is a 79 y.o. male who was just discharged from  our service 6 days ago after an admission for a UTI with  pseudomonas.  Since discharge he has getting progressively short  of breath until today he developed cough, productive of a whitish  sputum.  He has had no fever, no chills, no sweating since Monday  of this week.  In the ED while his WBC is elevated at 17k this is  significantly down from the >30k it was at discharge 6 days ago.   At the time of discharge he had been taken off of his Zaroxylyn  and had not yet restarted this, he did restart his lasix on  Tuesday.  MBS indicated following results of BSE completed on  11/25/12.       Assessment / Plan / Recommendation Clinical Impression  Dysphagia Diagnosis: Moderate oral phase dysphagia;Severe  pharyngeal phase dysphagia;Severe cervical esophageal phase  dysphagia;Suspected primary esophageal dysphagia  MBS completed.  Patient with head down posture with difficulty   maintaining  upright positon.  Assist required to reposition  patient throughout study as view blocked by shoulders.   Evaluation indicates moderate oral dysphagia marked by weak  lingual manipulation and reduced posterior propulsion.  Piecemeal  swallows with all consistencies.  Severe pharyngeal phase  dysphagia with suspected primary esophageal dysphagia.  Decreased  TBR resulting in residuals in vallecular space with all  consistencies.  Reduced pharyngeal peristalsis.  Diffuse residue  from vallecular space to pyriforms s/p swallow of nectar barium  by cup and spoon, puree  and mechanical soft consistency.   Prominent CP segment with pooling in pyriforms with all  consistencies prior to swallow with moderate amount of  containment s/p swallow.  Backflow into pharynx during swallow of  thin liquid barium by cup, straw and spoon with eventual  penetration s/p swallow from residuals.  Unable to confirm  aspiration with thins due to shoulders blocking view. Strategies  of multiple effortful swallows with puree consistency and nectar  thick liquids by straw  effective in clearing majority of  residuals but patient quickly fatigued. No backflow to pharynx  noted with puree and nectar thick barium.    Brief esophageal  sweep revealed significant amount of stasis with backflow to  cervical esophagus.  No radiologist present to confirm.  Patient  at high risk for aspiration with any consistency due to residuals  and risk of backflow of bolus into pharynx from cervical  esophagus.   MD given results of evaluation by treating SLP.  MD  confirmed to proceed with dysphagia 1 consistency and nectar  thick liquids in limited amounts with full supervision with  strict aspiration and reflux precautions.  Aspiration risk  remains even with modified diet.   Diagnostic treatment completed  following evaluation focusing on providing education to  caregivers on swallow strategies to maximize safety.   Patient  may benefit from GI consult to assess current esophageal  functioning.  ST to follow in acute care setting for diet  tolerance and POC.   Recommend repeat MBS following results of GI  consult.         Diet Recommendation Dysphagia 1 (Puree);Nectar-thick liquid   Liquid Administration via: Straw;Cup Medication Administration: Via  alternative means Supervision: Patient able to self feed;Full supervision/cueing  for compensatory strategies Compensations: Slow rate;Small sips/bites;Multiple dry swallows  after each bite/sip;Clear throat intermittently;Hard cough after  swallow;Effortful swallow Postural  Changes and/or Swallow Maneuvers: Seated upright 90  degrees;Upright 30-60 min after meal    Other  Recommendations Recommended Consults: Consider GI  evaluation Oral Care Recommendations: Oral care before and after PO Other Recommendations: Order thickener from pharmacy;Prohibited  food (jello, ice cream, thin soups);Have oral suction  available;Clarify dietary restrictions   Follow Up Recommendations  Outpatient SLP    Frequency and Duration min 2x/week  2 weeks       SLP Swallow Goals Patient will utilize recommended strategies during swallow to  increase swallowing safety with: Total assistance   General Date of Onset: 11/26/12 HPI: Kahli Fitzgerald is a 79 y.o. male who was just discharged  from our service 6 days ago after an admission for a UTI with  pseudomonas.  Since discharge he has getting progressively short  of breath until today he developed cough, productive of a whitish  sputum.  He has had no fever, no chills, no sweating since Monday  of this week.  In the ED while his WBC is elevated at 17k this is  significantly down from the >30k it was at discharge 6 days ago.   At the time of discharge he had been taken off of his Zaroxylyn  and had not yet restarted this, he did restart his lasix on  Tuesday. Type of Study: Modified Barium Swallowing Study Reason for Referral: Objectively evaluate swallowing function Previous Swallow Assessment: BSE 11/26/12 Diet Prior to this Study: NPO Temperature Spikes Noted: No Respiratory Status: Supplemental O2 delivered via (comment) History of Recent Intubation: No Behavior/Cognition: Alert;Cooperative;Hard of hearing;Requires  cueing Oral Cavity - Dentition: Missing dentition Oral Motor / Sensory Function: Impaired - see Bedside swallow  eval Self-Feeding Abilities: Able to feed self;Needs assist;Needs set  up Patient Positioning: Upright in chair Baseline Vocal Quality: Breathy;Hoarse;Low vocal intensity Volitional Cough: Strong Volitional Swallow: Able to elicit  Pharyngeal Secretions: Not observed secondary MBS    Reason for Referral Objectively evaluate swallowing function   Oral Phase Oral Preparation/Oral Phase Oral Phase: Impaired Oral - Nectar Oral - Nectar Teaspoon: Weak lingual manipulation;Lingual  pumping;Incomplete tongue to palate contact;Holding of  bolus;Reduced posterior propulsion;Lingual/palatal  residue;Piecemeal swallowing;Delayed oral transit Oral - Nectar Cup: Weak lingual manipulation;Lingual  pumping;Incomplete tongue to palate contact;Reduced posterior  propulsion;Holding of bolus;Lingual/palatal residue;Piecemeal  swallowing Oral - Nectar Straw: Weak lingual manipulation;Lingual  pumping;Incomplete tongue to palate contact;Reduced posterior  propulsion;Holding of bolus;Piecemeal swallowing;Lingual/palatal  residue;Delayed oral transit Oral - Thin Oral - Thin Teaspoon: Weak lingual manipulation;Lingual  pumping;Incomplete tongue to palate contact;Impaired  mastication;Lingual/palatal residue;Piecemeal swallowing;Holding  of bolus;Reduced posterior propulsion;Delayed oral transit Oral - Thin Cup: Weak lingual manipulation;Lingual  pumping;Incomplete tongue to palate contact;Reduced posterior  propulsion;Holding of bolus;Lingual/palatal residue;Piecemeal  swallowing;Delayed oral transit Oral - Thin Straw: Weak lingual manipulation;Lingual  pumping;Incomplete tongue to palate contact;Reduced posterior  propulsion;Holding of bolus;Piecemeal swallowing;Lingual/palatal  residue Oral - Solids Oral - Puree: Weak lingual manipulation;Lingual pumping;Holding  of bolus;Reduced posterior propulsion;Lingual/palatal  residue;Piecemeal swallowing;Delayed oral transit Oral - Mechanical Soft: Impaired mastication;Weak lingual  manipulation;Lingual pumping;Incomplete tongue to palate  contact;Piecemeal swallowing;Lingual/palatal residue;Delayed oral  transit   Pharyngeal Phase Pharyngeal Phase Pharyngeal Phase: Impaired Pharyngeal - Nectar Pharyngeal - Nectar Teaspoon:  Premature spillage to  valleculae;Delayed swallow initiation;Premature spillage to  pyriform sinuses;Reduced pharyngeal peristalsis;Reduced anterior  laryngeal mobility;Reduced laryngeal elevation;Reduced  airway/laryngeal closure;Reduced tongue  base  retraction;Penetration/Aspiration during  swallow;Penetration/Aspiration after swallow;Pharyngeal residue -  pyriform sinuses;Pharyngeal residue - valleculae;Pharyngeal  residue - cp segment Penetration/Aspiration details (nectar teaspoon): Material enters  airway, CONTACTS cords then ejected out Pharyngeal - Nectar Cup: Premature spillage to  valleculae;Premature spillage to pyriform sinuses;Delayed swallow  initiation;Reduced pharyngeal peristalsis;Reduced anterior  laryngeal mobility;Reduced laryngeal elevation;Reduced  airway/laryngeal closure;Reduced tongue base  retraction;Pharyngeal residue - pyriform sinuses;Pharyngeal  residue - valleculae;Pharyngeal residue - cp segment Pharyngeal - Thin Pharyngeal - Thin Teaspoon: Delayed swallow initiation;Premature  spillage to valleculae;Premature spillage to pyriform  sinuses;Reduced pharyngeal peristalsis;Reduced anterior laryngeal  mobility;Reduced tongue base retraction;Reduced airway/laryngeal  closure;Penetration/Aspiration after swallow;Pharyngeal residue -  pyriform sinuses;Pharyngeal residue - valleculae;Pharyngeal  residue - cp segment Penetration/Aspiration details (thin teaspoon): Material enters  airway, passes BELOW cords and not ejected out despite cough  attempt by patient Pharyngeal - Thin Cup: Premature spillage to pyriform  sinuses;Delayed swallow initiation;Reduced pharyngeal  peristalsis;Reduced anterior laryngeal mobility;Reduced laryngeal  elevation;Reduced airway/laryngeal closure;Reduced tongue base  retraction;Penetration/Aspiration after swallow;Trace  aspiration;Pharyngeal residue - valleculae;Pharyngeal residue -  pyriform sinuses;Pharyngeal residue - posterior  pharnyx;Pharyngeal residue - cp  segment Penetration/Aspiration details (thin cup): Material enters  airway, passes BELOW cords and not ejected out despite cough  attempt by patient Pharyngeal - Thin Straw: Premature spillage to pyriform  sinuses;Reduced pharyngeal peristalsis;Reduced anterior laryngeal  mobility;Reduced laryngeal elevation;Reduced airway/laryngeal  closure;Reduced tongue base retraction;Penetration/Aspiration  after swallow;Pharyngeal residue - pyriform sinuses;Pharyngeal  residue - posterior pharnyx;Pharyngeal residue - cp  segment;Pharyngeal residue - valleculae Penetration/Aspiration details (thin straw): Material enters  airway, passes BELOW cords and not ejected out despite cough  attempt by patient Pharyngeal - Solids Pharyngeal - Puree: Premature spillage to valleculae;Reduced  pharyngeal peristalsis;Reduced anterior laryngeal  mobility;Reduced tongue base retraction;Reduced airway/laryngeal  closure;Reduced laryngeal elevation;Pharyngeal residue - pyriform  sinuses;Pharyngeal residue - posterior pharnyx;Pharyngeal residue  - valleculae;Pharyngeal residue - cp segment Pharyngeal - Mechanical Soft: Premature spillage to  valleculae;Reduced pharyngeal peristalsis;Reduced anterior  laryngeal mobility;Reduced laryngeal elevation;Reduced  airway/laryngeal closure;Reduced tongue base  retraction;Penetration/Aspiration after swallow;Pharyngeal  residue - posterior pharnyx;Pharyngeal residue - cp  segment;Pharyngeal residue - pyriform sinuses;Pharyngeal residue  - valleculae Penetration/Aspiration details (mechanical soft): Material enters  airway, passes BELOW cords then ejected out  Cervical Esophageal Phase    GO    Cervical Esophageal Phase Cervical Esophageal Phase: Impaired Cervical Esophageal Phase - Nectar Nectar Cup: Reduced cricopharyngeal relaxation;Prominent  cricopharyngeal segment;Esophageal backflow into cervical  esophagus Nectar Straw: Prominent cricopharyngeal segment;Esophageal  backflow into cervical esophagus  Cervical Esophageal Phase - Thin Thin Cup: Reduced cricopharyngeal relaxation;Prominent  cricopharyngeal segment;Esophageal backflow into cervical  esophagus;Esophageal backflow into the pharynx Cervical Esophageal Phase - Solids Puree: Prominent cricopharyngeal segment;Esophageal backflow into  cervical esophagus;Reduced cricopharyngeal relaxation Mechanical Soft: Reduced cricopharyngeal relaxation;Prominent  cricopharyngeal segment        Moreen Fowler MS, CCC-SLP [phone] [facility] 11/27/2012, 5:25 PM     Scheduled Meds: . antiseptic oral rinse  15 mL Mouth Rinse q12n4p  . chlordiazePOXIDE  5 mg Oral TID  . chlorhexidine  15 mL Mouth Rinse BID  . Chlorhexidine Gluconate Cloth  6 each Topical Q0600  . DULoxetine  30 mg Oral QHS  . famotidine  20 mg Oral QHS  . free water  200 mL Per Tube Q6H  . insulin aspart  0-15 Units Subcutaneous Q4H  . insulin glargine  10 Units Subcutaneous Daily  . irbesartan  150 mg Oral Daily  . isosorbide dinitrate  15 mg Per Tube BID  . multivitamin  5 mL Per  Tube Daily  . mupirocin ointment  1 application Nasal BID  . senna-docusate  1 tablet Oral BID  . Warfarin - Pharmacist Dosing Inpatient   Does not apply q1800   Continuous Infusions: . sodium chloride 20 mL/hr (12/04/12 2218)  . feeding supplement (JEVITY 1.2 CAL) 1,000 mL (12/04/12 1829)    Time spent: 35 minutes with > 50% of time discussing current diagnostic test results, clinical impression and plan of care with the patient's nephew.    LOS: 10 days   Sy Saintjean  [facility] Pager [phone].   *Please note that the hospitalists switch teams on Wednesdays. Please call the flow manager at [phone] if you are having difficulty reaching the hospitalist taking care of this patient as she can update you and provide the most up-to-date pager number of provider caring for the patient. If 8PM-8AM, please contact night-coverage at [email or web address], password [email or web address]  12/05/2012, 7:15 AM

## 2012-12-05 NOTE — Progress Notes (Signed)
ANTICOAGULATION CONSULT NOTE - Follow Up Consult  Pharmacy Consult for warfarin Indication: h/o pulmonary embolism, afib  Allergies  Allergen Reactions  . Hydromorphone Hcl Other (See Comments)    Dilaudid caused Confusion   . Morphine Other (See Comments)    confusion  . Pregabalin Other (See Comments)    lyrica - caused hallucinations    Labs:  Recent Labs  12/03/12 0400 12/03/12 2328 12/04/12 0515 12/05/12 0630  HGB 9.8*  --  10.1*  --   HCT 31.8*  --  33.5*  --   PLT 340  --  342  --   LABPROT 23.8*  --  27.6* 21.4*  INR 2.21*  --  2.68* 1.92*  CREATININE 0.89 0.84 0.83  --     Assessment: 79 y/o M recently discharged from [facility] 8/16 for sepsis secondary to Pseudomonas UTI, presented 8/21 with progressive SOB, productive cough. Patient with h/o afib and PE, on chronic warfarin. Patient was reportedly taking warfarin 5mg  po daily with last dose 8/21.  On ventilator since 8/24 with possible aspiration pneumonia, extubated 8/28.   TF reinitiated, currently at 70 ml/hr  INR decreased to 1.92 today - likely stabilizing with adequate nutrition  H/H and pltc acceptable, no bleeding reported.  Goal of Therapy:  INR 2-3 Monitor platelets by anticoagulation protocol: Yes   Plan:   Increase warfarin 5mg  via tube x 1 tonight @ 1800  Daily PT/INR  Loralee Pacas, PharmD, BCPS Pager: [phone]  12/05/2012, 8:28 AM      .

## 2012-12-05 NOTE — Progress Notes (Signed)
Thank you for consulting the Palliative Medicine Team at [facility] to meet your patient's and family's needs.   Family meeting scheduled for Monday 9/1 at 12 noon. Spouse will be present and potentially a niece and nephew.  Anderson Malta, DO Palliative Medicine

## 2012-12-06 ENCOUNTER — Inpatient Hospital Stay (HOSPITAL_COMMUNITY): Payer: Medicare Other

## 2012-12-06 DIAGNOSIS — J9811 Atelectasis: Secondary | ICD-10-CM | POA: Diagnosis not present

## 2012-12-06 DIAGNOSIS — Z515 Encounter for palliative care: Secondary | ICD-10-CM

## 2012-12-06 DIAGNOSIS — R627 Adult failure to thrive: Secondary | ICD-10-CM

## 2012-12-06 DIAGNOSIS — R5381 Other malaise: Secondary | ICD-10-CM

## 2012-12-06 DIAGNOSIS — R1310 Dysphagia, unspecified: Secondary | ICD-10-CM

## 2012-12-06 DIAGNOSIS — R531 Weakness: Secondary | ICD-10-CM

## 2012-12-06 LAB — BASIC METABOLIC PANEL
BUN: 18 mg/dL (ref 6–23)
CO2: 37 mEq/L — ABNORMAL HIGH (ref 19–32)
Chloride: 106 mEq/L (ref 96–112)
Creatinine, Ser: 0.82 mg/dL (ref 0.50–1.35)
Potassium: 4 mEq/L (ref 3.5–5.1)

## 2012-12-06 LAB — URINALYSIS, ROUTINE W REFLEX MICROSCOPIC
Glucose, UA: 500 mg/dL — AB
Nitrite: NEGATIVE
Specific Gravity, Urine: 1.022 (ref 1.005–1.030)
pH: 7 (ref 5.0–8.0)

## 2012-12-06 LAB — GLUCOSE, CAPILLARY
Glucose-Capillary: 213 mg/dL — ABNORMAL HIGH (ref 70–99)
Glucose-Capillary: 239 mg/dL — ABNORMAL HIGH (ref 70–99)

## 2012-12-06 LAB — CBC
HCT: 34.5 % — ABNORMAL LOW (ref 39.0–52.0)
Hemoglobin: 10.4 g/dL — ABNORMAL LOW (ref 13.0–17.0)
MCV: 99.4 fL (ref 78.0–100.0)
RDW: 15.6 % — ABNORMAL HIGH (ref 11.5–15.5)
WBC: 13.5 10*3/uL — ABNORMAL HIGH (ref 4.0–10.5)

## 2012-12-06 LAB — URINE MICROSCOPIC-ADD ON

## 2012-12-06 LAB — PROTIME-INR: INR: 1.37 (ref 0.00–1.49)

## 2012-12-06 MED ORDER — ACETAMINOPHEN 160 MG/5ML PO SOLN
650.0000 mg | Freq: Four times a day (QID) | ORAL | Status: DC | PRN
  Administered 2012-12-06: 650 mg
  Filled 2012-12-06: qty 20.3

## 2012-12-06 MED ORDER — ALBUTEROL SULFATE (5 MG/ML) 0.5% IN NEBU: 2.5000 mg | INHALATION_SOLUTION | Freq: Four times a day (QID) | RESPIRATORY_TRACT | Status: DC | PRN

## 2012-12-06 MED ORDER — ACETYLCYSTEINE 20 % IN SOLN
4.0000 mL | Freq: Four times a day (QID) | RESPIRATORY_TRACT | Status: DC
  Administered 2012-12-07 (×3): 4 mL via RESPIRATORY_TRACT
  Filled 2012-12-06 (×4): qty 4

## 2012-12-06 MED ORDER — FREE WATER
200.0000 mL | Status: DC
  Administered 2012-12-06 – 2012-12-07 (×6): 200 mL

## 2012-12-06 MED ORDER — ALBUTEROL SULFATE (5 MG/ML) 0.5% IN NEBU
2.5000 mg | INHALATION_SOLUTION | Freq: Four times a day (QID) | RESPIRATORY_TRACT | Status: DC
  Administered 2012-12-07 (×3): 2.5 mg via RESPIRATORY_TRACT
  Filled 2012-12-06 (×3): qty 0.5

## 2012-12-06 MED ORDER — INSULIN GLARGINE 100 UNIT/ML ~~LOC~~ SOLN
40.0000 [IU] | Freq: Every day | SUBCUTANEOUS | Status: DC
  Administered 2012-12-06: 40 [IU] via SUBCUTANEOUS
  Filled 2012-12-06: qty 0.4

## 2012-12-06 MED ORDER — WARFARIN SODIUM 7.5 MG PO TABS
7.5000 mg | ORAL_TABLET | Freq: Once | ORAL | Status: DC
  Filled 2012-12-06: qty 1

## 2012-12-06 MED ORDER — ACETYLCYSTEINE 20 % IN SOLN
10.0000 mL | Freq: Two times a day (BID) | RESPIRATORY_TRACT | Status: DC
  Administered 2012-12-06: 10 mL via RESPIRATORY_TRACT
  Administered 2012-12-06: 4 mL via RESPIRATORY_TRACT
  Filled 2012-12-06 (×2): qty 10

## 2012-12-06 MED ORDER — ALBUTEROL SULFATE (5 MG/ML) 0.5% IN NEBU
2.5000 mg | INHALATION_SOLUTION | Freq: Two times a day (BID) | RESPIRATORY_TRACT | Status: DC
  Administered 2012-12-06 (×2): 2.5 mg via RESPIRATORY_TRACT
  Filled 2012-12-06 (×2): qty 0.5

## 2012-12-06 MED ORDER — WARFARIN SODIUM 7.5 MG PO TABS
7.5000 mg | ORAL_TABLET | Freq: Once | ORAL | Status: AC
  Administered 2012-12-06: 7.5 mg
  Filled 2012-12-06: qty 1

## 2012-12-06 MED ORDER — GUAIFENESIN 100 MG/5ML PO SYRP
200.0000 mg | ORAL_SOLUTION | ORAL | Status: DC
  Administered 2012-12-06 – 2012-12-07 (×7): 200 mg via NASOGASTRIC
  Filled 2012-12-06 (×12): qty 10

## 2012-12-06 MED ORDER — VANCOMYCIN HCL 10 G IV SOLR
1250.0000 mg | Freq: Two times a day (BID) | INTRAVENOUS | Status: DC
  Administered 2012-12-06 – 2012-12-07 (×3): 1250 mg via INTRAVENOUS
  Filled 2012-12-06 (×3): qty 1250

## 2012-12-06 MED ORDER — INSULIN GLARGINE 100 UNIT/ML ~~LOC~~ SOLN: 30.0000 [IU] | Freq: Every day | SUBCUTANEOUS | Status: DC

## 2012-12-06 MED ORDER — PIPERACILLIN-TAZOBACTAM 3.375 G IVPB
3.3750 g | Freq: Three times a day (TID) | INTRAVENOUS | Status: DC
  Administered 2012-12-06 – 2012-12-07 (×3): 3.375 g via INTRAVENOUS
  Filled 2012-12-06 (×5): qty 50

## 2012-12-06 NOTE — Progress Notes (Signed)
Physical Therapy Treatment Patient Details Name: Frederick [name] MRN: [id number] DOB: 07/16/1929 Today's Date: 12/06/2012 Time: 4098-1191 PT Time Calculation (min): 24 min  PT Assessment / Plan / Recommendation  History of Present Illness Pt to ICU with intubation 8/24-8/28 after being found with decreased responsiveness.  Pt now returned to med surg floor  He had panda tube inserted today and mitts are in place on hands so he does not pull it out   PT Comments   Pt still very deconditioned. Fatigues easily with bed mobility, sitting EOB. Attempted to work on sitting posture and tolerance this session.   Follow Up Recommendations  SNF     Does the patient have the potential to tolerate intense rehabilitation     Barriers to Discharge        Equipment Recommendations  None recommended by PT    Recommendations for Other Services OT consult  Frequency Min 3X/week   Progress towards PT Goals Progress towards PT goals: Progressing toward goals  Plan Current plan remains appropriate    Precautions / Restrictions Precautions Precautions: Fall Restrictions Weight Bearing Restrictions: No Other Position/Activity Restrictions: limited mobility at baseline-stand pivot transfers only (due to chronic R hip issues)   Pertinent Vitals/Pain No c/o pain although pt did not clearly state "no" when asked if he was having any pain.     Mobility  Bed Mobility Bed Mobility: Supine to Sit;Sit to Supine;Scooting to HOB Supine to Sit: HOB elevated Supine to Sit: Patient Percentage: 20% Sit to Supine: 1: +2 Total assist Sit to Supine: Patient Percentage: 0% Scooting to HOB: 1: +2 Total assist Scooting to [facility]: Patient Percentage: 0% Details for Bed Mobility Assistance: Assist for trunk and bil LEs. Utilzed bedpad for scooting, positioning. Liimited assistance/use of UEs during bed mobility. Encouraged pt to participate as much as possible.  Transfers Transfers: Not  assessed Ambulation/Gait Ambulation/Gait Assistance: Not tested (comment)    Exercises     PT Diagnosis:    PT Problem List:   PT Treatment Interventions:     PT Goals (current goals can now be found in the care plan section)    Visit Information  Last PT Received On: 12/06/12 Assistance Needed: +2 History of Present Illness: Pt to ICU with intubation 8/24-8/28 after being found with decreased responsiveness.  Pt now returned to med surg floor  He had panda tube inserted today and mitts are in place on hands so he does not pull it out    Subjective Data      Cognition  Cognition Arousal/Alertness: Awake/alert Behavior During Therapy: Flat affect Overall Cognitive Status: History of cognitive impairments - at baseline    Balance  Balance Balance Assessed: Yes Static Sitting Balance Static Sitting - Balance Support: Bilateral upper extremity supported;Feet supported Static Sitting - Level of Assistance: 3: Mod assist Static Sitting - Comment/# of Minutes: Vaying level of assistance-Min to Mod assist. Pt tends to lean to L side and tilt head to L side as well. External assist for proper positioning of head, neck, trunk-pt unable to maintain. Fatigues easily.   End of Session PT - End of Session Activity Tolerance: Patient limited by fatigue Patient left: in bed;with call bell/phone within reach;with family/visitor present   GP     Rebeca Alert, MPT Pager: [phone]

## 2012-12-06 NOTE — Progress Notes (Signed)
ANTIBIOTIC CONSULT NOTE - INITIAL  Pharmacy Consult for Vancomycin and Zosyn Indication: HAP  Allergies  Allergen Reactions  . Hydromorphone Hcl Other (See Comments)    Dilaudid caused Confusion   . Morphine Other (See Comments)    confusion  . Pregabalin Other (See Comments)    lyrica - caused hallucinations    Patient Measurements: Height: 5\' 8"  (172.7 cm) Weight: 218 lb 4.1 oz (99 kg) IBW/kg (Calculated) : 68.4  Vital Signs: Temp: 100 F (37.8 C) (09/01 0700) Temp src: Axillary (09/01 0542) BP: 135/67 mmHg (09/01 0700) Pulse Rate: 96 (09/01 0542) Intake/Output from previous day: 08/31 0701 - 09/01 0700 In: 486.7 [I.V.:486.7] Out: 1625 [Urine:1625] Intake/Output from this shift:    Labs:  Recent Labs  12/03/12 2328 12/04/12 0515 12/06/12 0545  WBC  --  13.3* 13.5*  HGB  --  10.1* 10.4*  PLT  --  342 359  CREATININE 0.84 0.83 0.82   Estimated Creatinine Clearance: 80.5 ml/min (by C-G formula based on Cr of 0.82).  70 ml/min/1.33m2 (normalized)   Microbiology: Recent Results (from the past 720 hour(s))  URINE CULTURE     Status: None   Collection Time    11/15/12  8:17 PM      Result Value Range Status   Specimen Description URINE, RANDOM   Final   Special Requests NONE   Final   Culture  Setup Time     Final   Value: 11/16/2012 06:12     Performed at [facility] Count     Final   Value: >=100,000 COLONIES/ML     Performed at [facility]   Culture     Final   Value: PSEUDOMONAS AERUGINOSA     Performed at [facility]   Report Status 11/17/2012 FINAL   Final   Organism ID, Bacteria PSEUDOMONAS AERUGINOSA   Final  CULTURE, BLOOD (ROUTINE X 2)     Status: None   Collection Time    11/15/12  8:28 PM      Result Value Range Status   Specimen Description BLOOD RIGHT HAND   Final   Special Requests BOTTLES DRAWN AEROBIC AND ANAEROBIC   Final   Culture  Setup Time     Final   Value: 11/16/2012 06:10   Performed at [facility]   Culture     Final   Value: NO GROWTH 5 DAYS     Performed at [facility]   Report Status 11/22/2012 FINAL   Final  CULTURE, BLOOD (ROUTINE X 2)     Status: None   Collection Time    11/15/12  8:44 PM      Result Value Range Status   Specimen Description BLOOD LEFT HAND   Final   Special Requests BOTTLES DRAWN AEROBIC AND ANAEROBIC   Final   Culture  Setup Time     Final   Value: 11/16/2012 06:11     Performed at [facility]   Culture     Final   Value: NO GROWTH 5 DAYS     Performed at [facility]   Report Status 11/22/2012 FINAL   Final  MRSA PCR SCREENING     Status: Abnormal   Collection Time    11/16/12 12:11 AM      Result Value Range Status   MRSA by PCR POSITIVE (*) NEGATIVE Final   Comment:  The GeneXpert MRSA Assay (FDA     approved for NASAL specimens     only), is one component of a     comprehensive MRSA colonization     surveillance program. It is not     intended to diagnose MRSA     infection nor to guide or     monitor treatment for     MRSA infections.     RESULT CALLED TO, READ BACK BY AND VERIFIED WITH:     STOCKS,M/2W @0252  ON 11/16/12 BY KARCZEWSKI,S.  MRSA PCR SCREENING     Status: Abnormal   Collection Time    11/26/12  4:51 AM      Result Value Range Status   MRSA by PCR POSITIVE (*) NEGATIVE Final   Comment:            The GeneXpert MRSA Assay (FDA     approved for NASAL specimens     only), is one component of a     comprehensive MRSA colonization     surveillance program. It is not     intended to diagnose MRSA     infection nor to guide or     monitor treatment for     MRSA infections.     RESULT CALLED TO, READ BACK BY AND VERIFIED WITH:     A. MOORE RN AT 1610 ON 08.22.14 BY SHUEA  URINE CULTURE     Status: None   Collection Time    11/28/12 10:40 AM      Result Value Range Status   Specimen Description URINE, CATHETERIZED   Final   Special Requests NONE    Final   Culture  Setup Time     Final   Value: 11/28/2012 15:47     Performed at [facility] Count     Final   Value: NO GROWTH     Performed at [facility]   Culture     Final   Value: NO GROWTH     Performed at [facility]   Report Status 11/29/2012 FINAL   Final  CULTURE, BLOOD (ROUTINE X 2)     Status: None   Collection Time    11/28/12 11:00 AM      Result Value Range Status   Specimen Description BLOOD LEFT ARM   Final   Special Requests BOTTLES DRAWN AEROBIC AND ANAEROBIC 6 CC EA   Final   Culture  Setup Time     Final   Value: 11/28/2012 19:51     Performed at [facility]   Culture     Final   Value: NO GROWTH 5 DAYS     Performed at [facility]   Report Status 12/04/2012 FINAL   Final  CULTURE, BLOOD (ROUTINE X 2)     Status: None   Collection Time    11/28/12 11:11 AM      Result Value Range Status   Specimen Description BLOOD LEFT ARM   Final   Special Requests BOTTLES DRAWN AEROBIC AND ANAEROBIC 6 CC EA   Final   Culture  Setup Time     Final   Value: 11/28/2012 19:51     Performed at [facility]   Culture     Final   Value: NO GROWTH 5 DAYS     Performed at [facility]   Report Status 12/04/2012 FINAL   Final  CULTURE, RESPIRATORY (NON-EXPECTORATED)  Status: None   Collection Time    11/28/12 11:35 AM      Result Value Range Status   Specimen Description TRACHEAL ASPIRATE   Final   Special Requests NONE   Final   Gram Stain     Final   Value: FEW WBC PRESENT, PREDOMINANTLY PMN     RARE SQUAMOUS EPITHELIAL CELLS PRESENT     RARE GRAM POSITIVE COCCI IN PAIRS     Performed at [facility]   Culture     Final   Value: FEW CANDIDA ALBICANS     Performed at [facility]   Report Status 12/01/2012 FINAL   Final  CLOSTRIDIUM DIFFICILE BY PCR     Status: None   Collection Time    12/01/12 11:26 PM      Result Value Range Status   C difficile by pcr NEGATIVE  NEGATIVE  Final   Comment: Performed at [facility]    Medical History: Past Medical History  Diagnosis Date  . Diabetes mellitus   . Hypertension   . Hypercholesterolemia   . Esophageal stricture   . Pulmonary embolism 2008    chronic coumadin   . Coronary artery disease     DR. BERRY IS PT'S CARDIOLOGIST  . Atrial fibrillation     CHRONIC COUMADIN-pemanent atrial fib  . Anxiety     SINCE 1975   . Depression     SINCE 1975    . Shortness of breath     NOT SURE WHAT CAUSES SOB - BUT HE IS NOT ABLE TO BE ACTIVE - AND IS SOB WITH ANY ACTIVITY - USES OXYGEN 2 L NASAL CANNULA - ALL THE TIME -EXCEPT WHEN IN SHOWER OR CHANGING CLOTHES on home 02  . Peripheral vascular disease     TOLD SOME SMALL AMOUNT OF BLOCKAGE IN LEG WHEN HEART CATH DONE 2008  . Bladder tumor     HAS HAD HEMATURIA OFF AND ON - HAS FOLEY CATH THAT WAS PLACED JUNE 20TH, 2014  . GERD (gastroesophageal reflux disease)   . Edema     FEET AND LEGS MOST DAYS - SOMETIMES WEARS COMPRESSION HOSE  . Cancer     SKIN CANCERS  . Arthritis     S/P BILATERAL TOTAL KNEE REPLACEMENTS - BUT BOTH KNEES PAINFUL AND JOINTS WORN OUT; BAD RIGHT HIP - BUT NOT A CANDIDATE FOR HIP PREPLACMENT;  HAS SEVERE LOWER  BACK AND LEG PAIN - HAS SPINAL STENOSIS AND BULGING DISC; CURVATURE OF UPPER SPINE - UNABLE TO LAY HIS HEAD FLAT.  Marland Kitchen Pneumonia 2005  . Difficult intubation     DUE TO LIMITED NECK FLEXION AND SHORT NECK--ANESTHESIA RECORD FROM 2007 VATS SURGERY AT [facility] OBTAINED AND ON PT'S CHART.  Marland Kitchen Problems with hearing     WEARS BILATERAL HEARING AIDS    Medications:  Scheduled:  . acetylcysteine  10 mL Nebulization Q12H  . albuterol  2.5 mg Nebulization Q12H  . antiseptic oral rinse  15 mL Mouth Rinse q12n4p  . chlordiazePOXIDE  5 mg Oral TID  . chlorhexidine  15 mL Mouth Rinse BID  . DULoxetine  30 mg Oral QHS  . famotidine  20 mg Oral QHS  . free water  200 mL Per Tube Q4H  . guaifenesin  200 mg Per NG tube Q4H  . insulin aspart   0-20 Units Subcutaneous Q4H  . insulin glargine  40 Units Subcutaneous QHS  . irbesartan  150 mg Oral Daily  .  isosorbide dinitrate  15 mg Per Tube BID  . multivitamin  5 mL Per Tube Daily  . mupirocin ointment  1 application Nasal BID  . nystatin cream   Topical BID  . senna-docusate  1 tablet Oral BID  . warfarin  7.5 mg Per Tube ONCE-1800  . Warfarin - Pharmacist Dosing Inpatient   Does not apply q1800   Infusions:  . sodium chloride 20 mL/hr (12/04/12 2218)  . feeding supplement (JEVITY 1.2 CAL) 1,000 mL (12/04/12 1829)   Assessment: 79 yo male known to Pharmacy from anticoagulation and previous antibiotic dosing protocols. 80 yo male admitted with resp failure just discharged recently on 8/16 after an admission for pseudomonas UTI and treated for HCAP vs aspiration PNA with vancomycin and zosyn from 8/24 to 8/28.   Resuming vancomycin and zosyn today for possible HAP  SCr significantly improved from admission  WBC remains elevated  Tmax 101.5 this am  Repeat blood and urine cultures pending  CXR this complete opacification of the left hemithorax, new compared to the prior study   Goal of Therapy:  Vancomycin trough level 15-20 mcg/ml  Plan:   Vancomycin 1250mg  IV q12, Check trough at steady state Zosyn 3.375gm IV q8h (4hr extended infusions) Follow up renal function & cultures  Loralee Pacas, PharmD, BCPS Pager: [phone] 12/06/2012,2:00 PM

## 2012-12-06 NOTE — Progress Notes (Signed)
ANTICOAGULATION CONSULT NOTE - Follow Up Consult  Pharmacy Consult for warfarin Indication: h/o pulmonary embolism, afib  Allergies  Allergen Reactions  . Hydromorphone Hcl Other (See Comments)    Dilaudid caused Confusion   . Morphine Other (See Comments)    confusion  . Pregabalin Other (See Comments)    lyrica - caused hallucinations    Labs:  Recent Labs  12/03/12 2328 12/04/12 0515 12/05/12 0630 12/06/12 0545  HGB  --  10.1*  --  10.4*  HCT  --  33.5*  --  34.5*  PLT  --  342  --  359  LABPROT  --  27.6* 21.4* 16.5*  INR  --  2.68* 1.92* 1.37  CREATININE 0.84 0.83  --  0.82    Assessment: 79 y/o M recently discharged from [facility] 8/16 for sepsis secondary to Pseudomonas UTI, presented 8/21 with progressive SOB, productive cough. Patient with h/o afib and PE, on chronic warfarin. Patient was reportedly taking warfarin 5mg  po daily with last dose 8/21.  On ventilator since 8/24 with possible aspiration pneumonia, extubated 8/28.   TF reinitiated, currently at 70 ml/hr  INR down to 1.37 likely 2/2 holding dose on 8/28 and lower than home Coumadin dose on 8/29 and 8/30. CBC stable. No bleeding. Family meeting with palliative care scheduled today at noon. Pt spiked temp this morning (101.5 axillary)  Goal of Therapy:  INR 2-3 Monitor platelets by anticoagulation protocol: Yes   Plan:   Boost with Coumadin 7.5mg  po x 1 tonight via tube.   Daily PT/INR  Geoffry Paradise, PharmD, BCPS Pager: [phone] 7:53 AM Pharmacy #: [phone]    .

## 2012-12-06 NOTE — Evaluation (Signed)
Occupational Therapy Evaluation Patient Details Name: Emran Molzahn MRN: [id number] DOB: [birth year] Today's Date: 12/06/2012 Time: 4098-1191 OT Time Calculation (min): 15 min  OT Assessment / Plan / Recommendation History of present illness Pt to ICU with intubation 8/24-8/28 after being found with decreased responsiveness.  Pt now returned to med surg floor  He had panda tube inserted today and mitts are in place on hands so he does not pull it out   Clinical Impression   Pt seen for OT evaluation.  He is weak and deconditioned and will benefit from skilled OT to increase strength and activity tolerance.  See goals listed below.      OT Assessment  Patient needs continued OT Services    Follow Up Recommendations  SNF    Barriers to Discharge      Equipment Recommendations   (to be further assessed:  none at this time)    Recommendations for Other Services    Frequency  Min 2X/week    Precautions / Restrictions Precautions Precautions: Fall Restrictions Weight Bearing Restrictions: No Other Position/Activity Restrictions: limited mobility at baseline-stand pivot transfers only (due to chronic R hip issues)   Pertinent Vitals/Pain No signs/symptoms of pain    ADL  Eating/Feeding: NPO Transfers/Ambulation Related to ADLs: pt needs A x 2 for all mobility ADL Comments: Pt needs total A for all ADLs, A x 2 for LB with rolling    OT Diagnosis: Generalized weakness  OT Problem List: Decreased strength;Decreased activity tolerance;Impaired balance (sitting and/or standing);Impaired UE functional use OT Treatment Interventions: Self-care/ADL training;Therapeutic exercise;Therapeutic activities;Patient/family education;Balance training   OT Goals(Current goals can be found in the care plan section) Acute Rehab OT Goals Patient Stated Goal: for pt to be able to stand and pivot to motorized w/c or BSC OT Goal Formulation: With family Time For Goal Achievement: 12/20/12 Potential  to Achieve Goals: Fair ADL Goals Additional ADL Goal #1: Pt will initiate UE movement during AAROM for 25% of trials with multimodal cues, to increase strength for functional tasks Additional ADL Goal #2: Pt perform bed mobility with A x 2, pt 30% in preparation for transfer to 3:1  Visit Information  Last OT Received On: 12/06/12 Assistance Needed: +2 History of Present Illness: Pt to ICU with intubation 8/24-8/28 after being found with decreased responsiveness.  Pt now returned to med surg floor  He had panda tube inserted today and mitts are in place on hands so he does not pull it out       Prior Functioning     Home Living Family/patient expects to be discharged to:: Skilled nursing facility Prior Function Level of Independence: Needs assistance Gait / Transfers Assistance Needed: +1 assist with RW-stand pivot transfers Communication Communication: HOH;Expressive difficulties (speech difficult to understand) Dominant Hand: Right         Vision/Perception     Cognition  Cognition Arousal/Alertness: Awake/alert Behavior During Therapy: WFL for tasks assessed/performed Overall Cognitive Status: History of cognitive impairments - at baseline    Extremity/Trunk Assessment Upper Extremity Assessment Upper Extremity Assessment: RUE deficits/detail;LUE deficits/detail RUE Deficits / Details: h/o bil shoulder replacements.  Pt profoundly weak throughout bil UEs.  Lifted arm against gravity about 20 degrees. Overall strength grossly 2 to 2+/5.  PROM of shoulder to 90 LUE Deficits / Details: unable to lift arm against gravity:  PROM to shoulder approximately 70 degrees.  .  Pt did flex and extend elbow partial range.  Did not move wrist and fingers just slightly.  strength  2- to 2+/5     Mobility       Exercise Other Exercises Other Exercises: 5 reps shoulder, AAROM/PROM, flexion, abduction and gravity eliminated ER/IR.   Other Exercises: AAROM elbow to fingers.      Balance     End of Session OT - End of Session Activity Tolerance: Patient tolerated treatment well Patient left: in bed;with call bell/phone within reach;with family/visitor present  GO     Raygen Dahm 12/06/2012, 4:09 PM Marica Otter, OTR/L [phone] 12/06/2012

## 2012-12-06 NOTE — Progress Notes (Signed)
Clinical Social Work Department BRIEF PSYCHOSOCIAL ASSESSMENT 12/06/2012  Patient:  Frederick [name],Frederick [name]     Account Number:  [id number]     Admit date:  11/25/2012  Clinical Social Worker:  Dennison Bulla  Date/Time:  12/06/2012 12:30 PM  Referred by:  RN  Date Referred:  12/06/2012 Referred for  SNF Placement   Other Referral:   Interview type:  Family Other interview type:    PSYCHOSOCIAL DATA Living Status:  FAMILY Admitted from facility:   Level of care:   Primary support name:  Mary Primary support relationship to patient:  SPOUSE Degree of support available:   Strong    CURRENT CONCERNS Current Concerns  Post-Acute Placement   Other Concerns:    SOCIAL WORK ASSESSMENT / PLAN CSW received referral to assist with DC planning. CSW met with patient, wife, nephew and niece at bedside. CSW introduced myself and explained role.    Patient laying in bed but did not participate in assessment at this time. Wife reports that patient has been wheelchair bound for about 5 years but was able to transfer and pivot with a walker. Wife assisted patient with activities but patient was able to due well with ambulating and bathing himself. Wife reports that she does not feel she can manage patient at home at DC.    CSW provided SNF list and explained SNF process. Patient and wife live in [place] but are interested in SNF placement in [facility] because patient has several doctors in the area. CSW explained Medicare coverage and explained that Medicaid would assist with long term placement if needed.    CSW completed FL2 and faxed out to [facility]. CSW will follow up with bed offers.   Assessment/plan status:  Psychosocial Support/Ongoing Assessment of Needs Other assessment/ plan:   Information/referral to community resources:   SNF list    PATIENT'S/FAMILY'S RESPONSE TO PLAN OF CARE: Patient unable to fully participate in assessment. Wife engaged and asked  appropriate questions throughout session. Wife wants SNF placement at DC and understands process. Wife thanked CSW for time.       (Coverage for [employer])

## 2012-12-06 NOTE — Progress Notes (Signed)
Patient with a temperature of 101.5 axillary this am, and BP 176/95. MD called and orders received for PCXR, UA and culture, blood cultures, and tylenol per tube. All orders have been completed. Recheck of BP and temp revealed a temp of 100.0 and BP of 135/67. Will inform oncoming RN.

## 2012-12-06 NOTE — Consult Note (Signed)
Patient Frederick [name]      DOB: 06-07-1929      YNW:[id number]     Consult Note from the Palliative Medicine Team at [facility]    Consult Requested by: Frederick [name]     PCP: Frederick Fick, MD Reason for Consultation:Clarification of GOC and options     Phone Number:[phone]  Assessment of patients Current state:  79 year old obese male with a history of diabetes mellitus on insulin, hypertension, coronary artery disease, A. fib and history of PE on chronic Coumadin, GERD, history of peripheral vascular disease, depression, hard of hearing, history of pancreatic tumor status post resection and his recent transurethral bladder tumor resection and left ureteral stent placed by Frederick [name] on 11/03/2012 and discharged home with a foley was brought to the ED by his wife after she noticed him to have sudden onset chills and shaking this off and on. She quoted his temperature and was about 100F. Complicated hospital course including intubation and successful extubation but poor response to treatment.  Requiring Panda for nutritional support, significant functional and cognitive impairment.  Poor resposne to trreatment.  Family facing EOL decisions.   Consult is for review of medical treatment options, clarification of goals of care and end of life issues, disposition and options, and symptom recommendation.  This NP Frederick [name] reviewed medical records, received report from team, assessed the patient and then meet at the patient's bedside along with his wife Frederick [name] # [phone] and a niece Frederick [name] and Frederick [name]  to discuss diagnosis prognosis, GOC, EOL wishes disposition and options.   A detailed discussion was had today regarding advanced directives.  Concepts specific to code status, artifical feeding and hydration, continued IV antibiotics and rehospitalization was had.  The difference between a aggressive medical intervention path  and a palliative comfort care path for this  patient at this time was had.  Values and goals of care important to patient and family were attempted to be elicited.  Concept of Hospice and Palliative Care were discussed  Natural trajectory and expectations at EOL were discussed.  Questions and concerns addressed.  Hard Choices booklet left for review. Family encouraged to call with questions or concerns.  PMT will continue to support holistically.      Goals of Care: 1.  Code Status: Partial   2. Scope of Treatment: 1. Vital Signs:per unit  2. Respiratory/Oxygen: as needed for treatment 3. Nutritional Support/Tube Feeds:considering PEG 4. Antibiotics:yes 5. Blood Products:yes 6. IVF:yes 7. Labs:yes 8. Telemetry:yes as needed for treatment   4. Disposition:  Wife expresses understanding and acceptance that a SNF is likely the limited option for disposition when medically stable.   3. Symptom Management:   1. Dysphagia: NPO, family considering PEG  2.  Adult failure to thrive/weakness/altered mental status At this time family is hopeful for improvement.  Continue with current treatment plan.  Leaning toward PEG placement.   4. Psychosocial:  Emotional support offered to family at bedside.  Wife is having difficulty accepting the overall poor prognosis.  She continues to process the situation and options and is open to further discussion.    Patient Documents Completed or Given: Document Given Completed  Advanced Directives Pkt    MOST yes   DNR    Gone from My Sight    Hard Choices  yes    Brief GMW:NUUVOZ Frederick is an 79 y.o. male with a PMH of DM, hypertension, hyperlipidemia, coronary artery disease, atrial fibrillation, peripheral vascular disease, recent hospitalization for treatment  of pseudomonal UTI who was admitted on 11/26/2012 with cough and chest x-ray findings of pulmonary edema a left-sided pleural effusion concerning for decompensated heart failure. On 11/28/2012, the patient's mental status became  significantly altered with somnolence, and he was found to be hypercarbic and hypoxic with worsening respiratory failure, ultimately requiring intubation. He was successfully extubated on 12/02/2012. On 12/03/2012, a speech therapy evaluation was performed and he was found to have significant dysphasia necessitating placement of a feeding tube for tube feeds. Because of failure to thrive and progressive decline, a palliative care consult was requested on 12/05/12. Family meeting to be held 12/06/12. Patient spiked a temperature overnight 12/05/2012-12/06/2012, and now has a collapsed left lung.    ROS: unable to illicit due to altered cognition    PMH:  Past Medical History  Diagnosis Date  . Diabetes mellitus   . Hypertension   . Hypercholesterolemia   . Esophageal stricture   . Pulmonary embolism 2008    chronic coumadin   . Coronary artery disease     Frederick. BERRY IS PT'S CARDIOLOGIST  . Atrial fibrillation     CHRONIC COUMADIN-pemanent atrial fib  . Anxiety     SINCE 1975   . Depression     SINCE 1975    . Shortness of breath     NOT SURE WHAT CAUSES SOB - BUT HE IS NOT ABLE TO BE ACTIVE - AND IS SOB WITH ANY ACTIVITY - USES OXYGEN 2 L NASAL CANNULA - ALL THE TIME -EXCEPT WHEN IN SHOWER OR CHANGING CLOTHES on home 02  . Peripheral vascular disease     TOLD SOME SMALL AMOUNT OF BLOCKAGE IN LEG WHEN HEART CATH DONE 2008  . Bladder tumor     HAS HAD HEMATURIA OFF AND ON - HAS FOLEY CATH THAT WAS PLACED JUNE 20TH, 2014  . GERD (gastroesophageal reflux disease)   . Edema     FEET AND LEGS MOST DAYS - SOMETIMES WEARS COMPRESSION HOSE  . Cancer     SKIN CANCERS  . Arthritis     S/P BILATERAL TOTAL KNEE REPLACEMENTS - BUT BOTH KNEES PAINFUL AND JOINTS WORN OUT; BAD RIGHT HIP - BUT NOT A CANDIDATE FOR HIP PREPLACMENT;  HAS SEVERE LOWER  BACK AND LEG PAIN - HAS SPINAL STENOSIS AND BULGING DISC; CURVATURE OF UPPER SPINE - UNABLE TO LAY HIS HEAD FLAT.  Marland Kitchen Pneumonia 2005  . Difficult intubation      DUE TO LIMITED NECK FLEXION AND SHORT NECK--ANESTHESIA RECORD FROM 2007 VATS SURGERY AT [facility] OBTAINED AND ON PT'S CHART.  Marland Kitchen Problems with hearing     WEARS BILATERAL HEARING AIDS     PSH: Past Surgical History  Procedure Laterality Date  . Shoulder surgery      bilateral  . Pancreas surgery  2001    TUMOR REMOVED FROM PANCREAS AND SPLEEN ALSO REMOVED  . Total knee arthroplasty      bilateral  . Lung surgery  2007    non-cancer  VIDEO ASSISTED THORCOTOMY TO REMOVE FLUID / DECORTICATION. Frederick. Edwyna Shell  . Basil cell    . Cardiac catheterization  2008    totally occluded nondominant LCX wth mod. ostial RCA disease and nl. EF  . Esophageal stretch    . Skin cancer removed from left side of head - required skin graft from left leg    . Cartilage repair 198- left knee    . Joint replacement    . Transurethral resection of bladder tumor with gyrus (  turbt-gyrus) N/A 11/03/2012    Procedure: TRANSURETHRAL RESECTION OF BLADDER TUMOR WITH GYRUS (TURBT-GYRUS);  Surgeon: Milford Cage, MD;  Location: [facility];  Service: Urology;  Laterality: N/A;  . Cystoscopy/retrograde/ureteroscopy  11/03/2012    Procedure: CYSTOSCOPY/RETROGRADE/ LEFT URETEROSCOPY AND STENT PLACEMENT;  Surgeon: Milford Cage, MD;  Location: [facility];  Service: Urology;;  . Theador Hawthorne N/A 11/03/2012    Procedure: CYSTOGRAM;  Surgeon: Milford Cage, MD;  Location: [facility];  Service: Urology;  Laterality: N/A;   I have reviewed the FH and SH and  If appropriate update it with new information. Allergies  Allergen Reactions  . Hydromorphone Hcl Other (See Comments)    Dilaudid caused Confusion   . Morphine Other (See Comments)    confusion  . Pregabalin Other (See Comments)    lyrica - caused hallucinations   Scheduled Meds: . acetylcysteine  10 mL Nebulization Q12H  . albuterol  2.5 mg Nebulization Q12H  . antiseptic oral rinse  15 mL Mouth Rinse q12n4p  . chlordiazePOXIDE  5 mg Oral TID  . chlorhexidine   15 mL Mouth Rinse BID  . DULoxetine  30 mg Oral QHS  . famotidine  20 mg Oral QHS  . free water  200 mL Per Tube Q4H  . guaifenesin  200 mg Per NG tube Q4H  . insulin aspart  0-20 Units Subcutaneous Q4H  . insulin glargine  40 Units Subcutaneous QHS  . irbesartan  150 mg Oral Daily  . isosorbide dinitrate  15 mg Per Tube BID  . multivitamin  5 mL Per Tube Daily  . mupirocin ointment  1 application Nasal BID  . nystatin cream   Topical BID  . senna-docusate  1 tablet Oral BID  . warfarin  7.5 mg Per Tube ONCE-1800  . Warfarin - Pharmacist Dosing Inpatient   Does not apply q1800   Continuous Infusions: . sodium chloride 20 mL/hr (12/04/12 2218)  . feeding supplement (JEVITY 1.2 CAL) 1,000 mL (12/04/12 1829)   PRN Meds:.acetaminophen (TYLENOL) oral liquid 160 mg/5 mL, hydrALAZINE, hyoscyamine, liver oil-zinc oxide    BP 135/67  Pulse 96  Temp(Src) 100 F (37.8 C) (Axillary)  Resp 20  Ht 5\' 8"  (1.727 m)  Wt 99 kg (218 lb 4.1 oz)  BMI 33.19 kg/m2  SpO2 93%   PPS:20 %   Intake/Output Summary (Last 24 hours) at 12/06/12 1348 Last data filed at 12/06/12 0620  Gross per 24 hour  Intake 486.67 ml  Output   1625 ml  Net -1138.33 ml    Physical Exam:  General: ill appearing, lethargic HEENT:  Mm, no exudate, audible throat secretions Chest:   diminished in bases L>R, scattered coarse BS CVS: RRR Abdomen:soft NT +BS Ext: BLE +1 edema Neuro: opens eyes to name, unable to communicate needs, unable to follow commands  Labs: CBC    Component Value Date/Time   WBC 13.5* 12/06/2012 0545   RBC 3.47* 12/06/2012 0545   HGB 10.4* 12/06/2012 0545   HCT 34.5* 12/06/2012 0545   PLT 359 12/06/2012 0545   MCV 99.4 12/06/2012 0545   MCH 30.0 12/06/2012 0545   MCHC 30.1 12/06/2012 0545   RDW 15.6* 12/06/2012 0545   LYMPHSABS 1.3 12/04/2012 0515   MONOABS 1.2* 12/04/2012 0515   EOSABS 0.2 12/04/2012 0515   BASOSABS 0.1 12/04/2012 0515    BMET    Component Value Date/Time   NA 147* 12/06/2012  0545   K 4.0 12/06/2012 0545   CL 106 12/06/2012  0545   CO2 37* 12/06/2012 0545   GLUCOSE 254* 12/06/2012 0545   BUN 18 12/06/2012 0545   CREATININE 0.82 12/06/2012 0545   CALCIUM 9.4 12/06/2012 0545   GFRNONAA 81* 12/06/2012 0545   GFRAA >90 12/06/2012 0545    CMP     Component Value Date/Time   NA 147* 12/06/2012 0545   K 4.0 12/06/2012 0545   CL 106 12/06/2012 0545   CO2 37* 12/06/2012 0545   GLUCOSE 254* 12/06/2012 0545   BUN 18 12/06/2012 0545   CREATININE 0.82 12/06/2012 0545   CALCIUM 9.4 12/06/2012 0545   PROT 7.6 11/15/2012 1926   ALBUMIN 3.4* 11/15/2012 1926   AST 23 11/15/2012 1926   ALT 12 11/15/2012 1926   ALKPHOS 94 11/15/2012 1926   BILITOT 0.3 11/15/2012 1926   GFRNONAA 81* 12/06/2012 0545   GFRAA >90 12/06/2012 0545     Time In Time Out Total Time Spent with Patient Total Overall Time  1200 1400 100 min 120  min    Greater than 50%  of this time was spent counseling and coordinating care related to the above assessment and plan.  Frederick Creed NP  Palliative Medicine Team Team Phone # [phone] Pager [phone]  Discussed with Frederick Darnelle Catalan

## 2012-12-06 NOTE — Progress Notes (Signed)
Clinical Social Work Department CLINICAL SOCIAL WORK PLACEMENT NOTE 12/06/2012  Patient:  Frederick [name],Frederick [name]  Account Number:  [id number] Admit date:  11/25/2012  Clinical Social Worker:  Unk Lightning, LCSW  Date/time:  12/06/2012 12:30 PM  Clinical Social Work is seeking post-discharge placement for this patient at the following level of care:   SKILLED NURSING   (*CSW will update this form in Epic as items are completed)   12/06/2012  Patient/family provided with [facility] Department of Clinical Social Work's list of facilities offering this level of care within the geographic area requested by the patient (or if unable, by the patient's family).  12/06/2012  Patient/family informed of their freedom to choose among providers that offer the needed level of care, that participate in Medicare, Medicaid or managed care program needed by the patient, have an available bed and are willing to accept the patient.  12/06/2012  Patient/family informed of [facility]' ownership interest in [facility], as well as of the fact that they are under no obligation to receive care at this facility.  PASARR submitted to E[employer] on 12/06/2012 PASARR number received from [employer] on 12/06/2012  FL2 transmitted to all facilities in geographic area requested by pt/family on  12/06/2012 FL2 transmitted to all facilities within larger geographic area on   Patient informed that his/her managed care company has contracts with or will negotiate with  certain facilities, including the following:     Patient/family informed of bed offers received:   Patient chooses bed at  Physician recommends and patient chooses bed at    Patient to be transferred to  on   Patient to be transferred to facility by   The following physician request were entered in Epic:   Additional Comments:

## 2012-12-06 NOTE — Progress Notes (Signed)
[facility] PROGRESS NOTE  Frederick [name] ZOX:[id number] DOB: 1929-11-29 DOA: 11/25/2012 PCP: Georgianne Fick, MD  Brief narrative: Frederick [name] is an 79 y.o. male with a PMH of DM, hypertension, hyperlipidemia, coronary artery disease, atrial fibrillation, peripheral vascular disease, recent hospitalization for treatment of pseudomonal UTI who was admitted on 11/26/2012 with cough and chest x-ray findings of pulmonary edema a left-sided pleural effusion concerning for decompensated heart failure. On 11/28/2012, the patient's mental status became significantly altered with somnolence, and he was found to be hypercarbic and hypoxic with worsening respiratory failure, ultimately requiring intubation. He was successfully extubated on 12/02/2012. On 12/03/2012, a speech therapy evaluation was performed and he was found to have significant dysphasia necessitating placement of a feeding tube for tube feeds.  Because of failure to thrive and progressive decline, a palliative care consult was requested on 12/05/12.  Family meeting to be held 12/06/12.  Patient spiked a temperature overnight 12/05/2012-12/06/2012, and now has a collapsed left lung.  Assessment/Plan: Principal Problem:   Acute respiratory failure with hypoxia -Multifactorial with aspiration pneumonia and CHF contributory. -Intubated 11/28/2012-12/02/2012. -Continue pulmonary toilet and increase mobilization, respiratory status tenuous. Active Problems:   Atelectasis / left lung collapse -Noted on CXR 12/06/12. -Give trial of percussion per RT, Mucomyst/albuterol nebulizer treatments every 12 hours and guaifenesin every 4 hours via tube. -Can consider BiPAP however with dysphasia and tube feeding, the patient is at high risk for further problems with aspiration and this may pose more harm than benefit.   Fever -Temperature spike to 101.5 noted this morning. Portable chest x-ray, urinalysis and culture as well as blood cultures ordered by  cross covering physician. -Fever may be from atelectasis versus recurrent HAP/aspiration PNA or UTI.  Would hold off on antibiotics until palliative care team has a chance to meet with the patient's family to determine goals of care.   Hyponatremia -Increase free water to 200 cc per tube every 4 hours.   DIABETES MELLITUS, TYPE II, ON INSULIN -Currently on SSI insulin resistant scale every 4 hours and Lantus to 20 units daily. -CBGs 213-270.  Increase Lantus to 40 units daily (was on 46 units at home).   HYPERTENSION -Continue hydralazine as needed. -Continue Isordil, Avapro.  Given a dose of Lasix 12/05/12.   History of ESOPHAGEAL STRICTURE, dysphagia -Failed swallowing evaluation on 12/03/2012. -Will need GI evaluation to determine if esophageal stricture playing a role in his current dysphasia and if so, if there is a recommended treatment.  Discussed with Dr. Loreta Ave.  Not stable enough for EGD at present, and with relative stability of his stricture (not needed esophageal dilatation in several years), Dr. Loreta Ave does not feel this is the likely etiology of his stricture. -Panda tube placed with initiation of tube feeding per dietitian recommendations.  Tolerating well.   GERD -Continue Pepcid.   Atrial fibrillation, permanent -Rate controlled, on chronic Coumadin.   Coronary artery disease, last cath 2008 occluded nondom, LCX, wth moderate ostial RCA disease, normal LV function -Continue Isordil and Coumadin.   Diastolic CHF -Status post cardiology consultation done on 11/26/2012. Recommended a more aggressive diuretic regimen upon discharge. -Given a dose of Lasix on 12/05/2012 with subsequent 1.1 L diuresis. -Two-dimensional echocardiogram ordered. Followup results. ARB resumed.   Acute renal failure -Resolved.   Hypokalemia -Monitor and replace electrolytes as needed.   Septic shock secondary to Aspiration pneumonia -Completed 4 days of therapy with vancomycin and 5 days of Zosyn. -Blood  and urine cultures negative. Tracheal aspirate grew a few colonies  of yeast.   Delirium/toxic encephalopathy/Altered mental status -No history of advanced dementia, as initially reported. -Likely toxic encephalopathy from acute illness and hypoxia/hypercarbia. -Still having periods of confusion per wife, with sundowning. -Has safety mittens on.  Code Status: Partial Family Communication: Wife updated at bedside. Disposition Plan: SNF.   Medical Consultants:  Dr. Kalman Shan, Pulmonology.  Dr. Nanetta Batty, Cardiology  Palliative care  Other Consultants:  Diabetes coordinator  Dietician  Anti-infectives:  Vanc 8/24>>>8/27 (rising creat)   Zosyn 8/24>>>8/28   HPI/Subjective: Brandi Armato is awake today, following commands but difficult to understand secondary to weakness. Has some ongoing dyspnea, but no complaints of pain. Weak cough.  Objective: Filed Vitals:   12/05/12 2124 12/06/12 0542 12/06/12 0638 12/06/12 0700  BP:  171/96  135/67  Pulse:  96    Temp:  101.5 F (38.6 C)  100 F (37.8 C)  TempSrc:  Axillary    Resp:  20    Height:      Weight: 101.5 kg (223 lb 12.3 oz)  99 kg (218 lb 4.1 oz)   SpO2:  93%      Intake/Output Summary (Last 24 hours) at 12/06/12 0746 Last data filed at 12/06/12 0620  Gross per 24 hour  Intake 486.67 ml  Output   1625 ml  Net -1138.33 ml    Exam: Gen:  Awake, alert Cardiovascular:  Heart sounds are irregular  Respiratory:  Lungs markedly diminished on the left with a few scattered rhonchi on the right Gastrointestinal:  Abdomen soft, NT/ND, + BS Extremities:  1+ edema  Data Reviewed: Basic Metabolic Panel:  Recent Labs Lab 11/30/12 0400  12/01/12 0437 12/02/12 0330 12/02/12 1030 12/03/12 0400 12/03/12 2328 12/04/12 0515 12/06/12 0545  NA 140  < > 143  --  143 147* 143 146* 147*  K 2.8*  < > 3.8  --  2.9* 3.8 4.1 3.9 4.0  CL 98  < > 102  --  107 112 108 109 106  CO2 34*  < > 30  --  31 30 31  30  37*  GLUCOSE 258*  < > 121*  --  178* 157* 221* 300* 254*  BUN 43*  < > 62*  --  28* 21 19 20 18   CREATININE 1.14  < > 2.02*  --  1.00 0.89 0.84 0.83 0.82  CALCIUM 8.7  < > 9.7  --  8.9 9.4 9.1 9.2 9.4  MG 1.9  --  2.1 2.0  --   --   --  1.9  --   PHOS 3.1  --  3.6 2.4  --   --   --  2.2*  --   < > = values in this interval not displayed. GFR Estimated Creatinine Clearance: 80.5 ml/min (by C-G formula based on Cr of 0.82).  Coagulation profile  Recent Labs Lab 12/02/12 0330 12/03/12 0400 12/04/12 0515 12/05/12 0630 12/06/12 0545  INR 2.29* 2.21* 2.68* 1.92* 1.37    CBC:  Recent Labs Lab 11/30/12 0400 12/01/12 0437 12/03/12 0400 12/04/12 0515 12/06/12 0545  WBC 11.4* 12.6* 13.9* 13.3* 13.5*  NEUTROABS  --   --  9.5* 10.6*  --   HGB 10.0* 10.2* 9.8* 10.1* 10.4*  HCT 32.0* 32.0* 31.8* 33.5* 34.5*  MCV 94.7 94.1 98.1 98.5 99.4  PLT 352 256 340 342 359   Cardiac Enzymes:  Recent Labs Lab 12/01/12 1200 12/02/12 0330  CKTOTAL 44 39  CKMB 3.6  --    BNP (last  3 results)  Recent Labs  11/27/12 0538 12/02/12 0330 12/04/12 0515  PROBNP 1393.0* 1778.0* 3470.0*   CBG:  Recent Labs Lab 12/05/12 1631 12/05/12 2005 12/06/12 0007 12/06/12 0354 12/06/12 0726  GLUCAP 270* 247* 230* 213* 239*   Microbiology Recent Results (from the past 240 hour(s))  URINE CULTURE     Status: None   Collection Time    11/28/12 10:40 AM      Result Value Range Status   Specimen Description URINE, CATHETERIZED   Final   Special Requests NONE   Final   Culture  Setup Time     Final   Value: 11/28/2012 15:47     Performed at [facility] Count     Final   Value: NO GROWTH     Performed at [facility]   Culture     Final   Value: NO GROWTH     Performed at [facility]   Report Status 11/29/2012 FINAL   Final  CULTURE, BLOOD (ROUTINE X 2)     Status: None   Collection Time    11/28/12 11:00 AM      Result Value Range Status    Specimen Description BLOOD LEFT ARM   Final   Special Requests BOTTLES DRAWN AEROBIC AND ANAEROBIC 6 CC EA   Final   Culture  Setup Time     Final   Value: 11/28/2012 19:51     Performed at [facility]   Culture     Final   Value: NO GROWTH 5 DAYS     Performed at [facility]   Report Status 12/04/2012 FINAL   Final  CULTURE, BLOOD (ROUTINE X 2)     Status: None   Collection Time    11/28/12 11:11 AM      Result Value Range Status   Specimen Description BLOOD LEFT ARM   Final   Special Requests BOTTLES DRAWN AEROBIC AND ANAEROBIC 6 CC EA   Final   Culture  Setup Time     Final   Value: 11/28/2012 19:51     Performed at [facility]   Culture     Final   Value: NO GROWTH 5 DAYS     Performed at [facility]   Report Status 12/04/2012 FINAL   Final  CULTURE, RESPIRATORY (NON-EXPECTORATED)     Status: None   Collection Time    11/28/12 11:35 AM      Result Value Range Status   Specimen Description TRACHEAL ASPIRATE   Final   Special Requests NONE   Final   Gram Stain     Final   Value: FEW WBC PRESENT, PREDOMINANTLY PMN     RARE SQUAMOUS EPITHELIAL CELLS PRESENT     RARE GRAM POSITIVE COCCI IN PAIRS     Performed at [facility]   Culture     Final   Value: FEW CANDIDA ALBICANS     Performed at [facility]   Report Status 12/01/2012 FINAL   Final  CLOSTRIDIUM DIFFICILE BY PCR     Status: None   Collection Time    12/01/12 11:26 PM      Result Value Range Status   C difficile by pcr NEGATIVE  NEGATIVE Final   Comment: Performed at [facility]     Procedures and Diagnostic Studies:  Dg Chest 2 View 11/25/2012   *RADIOLOGY REPORT*  Clinical Data: Cough and chest congestion.  CHEST - 2 VIEW  Comparison: 11/15/2012  Findings: Shallow inspiration.  Mild cardiac enlargement with pulmonary vascular congestion and central interstitial changes suggesting edema.  This is progressing since the previous study. There is  superimposed infiltration or atelectasis in the left lung base.  Small left pleural effusion is not excluded.  No pneumothorax.  Calcified and tortuous aorta.  Degenerative changes in the spine and shoulders.  Surgical clips in the left upper quadrant.  Left ureteral stent.  IMPRESSION: Cardiac enlargement with developing pulmonary vascular congestion and perihilar edema.  Infiltration or atelectasis in the left lung base.  Possible left pleural effusion.   Original Report Authenticated By: Burman Nieves, M.D.    Dg Abd 1 View 12/03/2012   *RADIOLOGY REPORT*  Clinical Data: Panda placement confirmation.  ABDOMEN - 1 VIEW  Comparison: CT abdomen pelvis 11/15/2012  Findings: Enteric tube is seen with the tip overlying the expected location of the proximal stomach. Proximal and mid portions of a left double J ureteral stent are seen and appear properly positioned. Surgical clips are again noted in the left upper quadrant.  Nonobstructive bowel gas pattern. No acute osseous abnormality.  IMPRESSION: 1. Enteric tube tip overlying the stomach.  2. No acute findings.   Original Report Authenticated By: Jerene Dilling, M.D.    Dg Chest Port 1 View 12/02/2012   *RADIOLOGY REPORT*  Clinical Data: Endotracheal tube position.  PORTABLE CHEST - 1 VIEW  Comparison: December 01, 2012.  Findings: Endotracheal tube is in grossly good position with distal tip 2 cm above the carina.  Stable cardiomegaly is noted.  No change is noted in position of right internal jugular catheter. Mild central pulmonary vascular congestion is again noted.  Left basilar opacity is again noted and unchanged concerning for atelectasis and possible associated pleural effusion.  IMPRESSION: Endotracheal tube in grossly good position.  Stable left basilar opacity.   Original Report Authenticated By: Lupita Raider.,  M.D.    Dg Chest Port 1 View 12/01/2012   *RADIOLOGY REPORT*  Clinical Data: Evaluate endotracheal tube  PORTABLE CHEST - 1 VIEW   Comparison: Prior chest x-ray 11/27/2012  Findings: The tip of the endotracheal tube is 1.7 cm above the carina.  Stable position of right IJ central venous catheter with the tip at the superior cavoatrial junction.  The tip of the nasogastric tube projects over the stomach. Slightly improved left lower lobe collapse.  Persistent bilateral layering effusions and associated bibasilar opacities.  Multiple surgical clips project over the left upper quadrant and mid epigastric region. Incompletely imaged left double-J ureteral stent.  Stable cardiomegaly and aortic atherosclerosis.  IMPRESSION:  1.  Slightly improved left lower lobe atelectasis/collapse. 2.  Persistent left greater than right layering pleural effusions and associated bibasilar atelectasis versus infiltrate 3.  Stable support apparatus.  The tip of the endotracheal tube is 1.7 cm above the carina.   Original Report Authenticated By: Malachy Moan, M.D.    Dg Chest Port 1 View 11/30/2012   *RADIOLOGY REPORT*  Clinical Data: Atelectasis, endotracheal tube placement.  PORTABLE CHEST - 1 VIEW  Comparison: 11/29/2012.  Findings: Endotracheal tube terminates approximately 1.7 cm above the carina.  Nasogastric tube is followed into the stomach.  Right IJ central line tip projects over the SVC.  Heart size is grossly stable.  Left lower lobe collapse/consolidation and left pleural effusion persist.  Lungs are overall low in volume with mild right basilar air space disease as well.  Surgical clips are seen in the  upper abdomen.  IMPRESSION:  1.  Persistent left lower lobe collapse/consolidation.  Follow-up to clearing is recommended. 2.  Mild bibasilar air space disease.   Original Report Authenticated By: Leanna Battles, M.D.    Dg Chest Port 1 View 11/29/2012   *RADIOLOGY REPORT*  Clinical Data: Respiratory failure.  PORTABLE CHEST - 1 VIEW  Comparison: 11/28/2012  Findings: Endotracheal tube remains with the tip approximately 2 cm above the carina.   Central line and nasogastric tube positioning are stable.  Lungs show relatively stable left lower lobe atelectasis.  No pulmonary edema or pleural effusions are identified.  Heart size is stable.  IMPRESSION: Stable left lower lobe atelectasis.   Original Report Authenticated By: Irish Lack, M.D.    Dg Chest Port 1 View 11/28/2012   *RADIOLOGY REPORT*  Clinical Data: Respiratory distress.  Intubation, central venous catheter placement and nasogastric tube placement.  PORTABLE CHEST - 1 VIEW 11/28/2012 1029 hours:  Comparison: Portable chest x-ray earlier same day 0851 hours.  Findings: Endotracheal tube tip projects approximately 2 cm above carina.  Right jugular central venous catheter tip projects over the lower SVC.  No evidence of pneumothorax mediastinal hematoma. Nasogastric tube tip in the fundus the stomach.  Cardiac silhouette enlarged but stable.  Mild pulmonary venous hypertension, improved since earlier in the morning, without overt edema currently. Slight improved aeration in the left lower lobe, though moderate consolidation persist.  No new pulmonary parenchymal abnormalities.  IMPRESSION:  1.  Endotracheal tube tip approximately 2 cm above the carina. 2.  Right jugular central venous catheter tip in the lower SVC.  No acute complicating features. 3.  Nasogastric tube tip in the gastric fundus. 4.  Interval resolution of interstitial pulmonary edema since earlier same date.  Improved aeration in the left lower lobe, though moderate atelectasis and/or pneumonia persists.  No new abnormalities.   Original Report Authenticated By: Hulan Saas, M.D.    Dg Chest Port 1v Same Day 11/28/2012   *RADIOLOGY REPORT*  Clinical Data: Lethargy.  Respiratory distress.  PORTABLE CHEST - 1 VIEW SAME DAY  Comparison: 11/25/2012  Findings: The study is somewhat limited by respiratory motion, low lung volumes and a semi-erect rotated positioning.  Left lung base opacity noted previously is stable most likely  atelectasis although infiltrate is possible.  There is some central vascular congestion. Mild right perihilar airspace opacity is suggested which may reflect asymmetric edema.  Cardiac silhouette is mildly enlarged.  No pneumothorax is appreciated.  IMPRESSION: Allowing for differences in patient positioning and technique, findings are similar to the prior exam.  There may be mild pulmonary edema although this appearance is accentuated by low lung volumes and motion artifact.  Left lung base opacities most likely atelectasis.   Original Report Authenticated By: Amie Portland, M.D.    Dg Swallowing Func-speech Pathology 11/27/2012   Lacinda Axon, CCC-SLP     11/27/2012  6:07 PM Objective Swallowing Evaluation: Modified Barium Swallowing Study   Patient Details  Name: Kasin Tonkinson MRN: [id number] Date of Birth: [birth year]  Today's Date: 11/27/2012 Time: 1700-1730 SLP Time Calculation (min): 30 min  Past Medical History:  Past Medical History  Diagnosis Date  . Diabetes mellitus   . Hypertension   . Hypercholesterolemia   . Esophageal stricture   . Pulmonary embolism 2008    chronic coumadin   . Coronary artery disease     DR. BERRY IS PT'S CARDIOLOGIST  . Atrial fibrillation     CHRONIC COUMADIN-pemanent atrial fib  .  Anxiety     SINCE 1975   . Depression     SINCE 1975    . Shortness of breath     NOT SURE WHAT CAUSES SOB - BUT HE IS NOT ABLE TO BE ACTIVE -  AND IS SOB WITH ANY ACTIVITY - USES OXYGEN 2 L NASAL CANNULA -  ALL THE TIME -EXCEPT WHEN IN SHOWER OR CHANGING CLOTHES on home  02  . Peripheral vascular disease     TOLD SOME SMALL AMOUNT OF BLOCKAGE IN LEG WHEN HEART CATH DONE  2008  . Bladder tumor     HAS HAD HEMATURIA OFF AND ON - HAS FOLEY CATH THAT WAS PLACED  JUNE 20TH, 2014  . GERD (gastroesophageal reflux disease)   . Edema     FEET AND LEGS MOST DAYS - SOMETIMES WEARS COMPRESSION HOSE  . Cancer     SKIN CANCERS  . Arthritis     S/P BILATERAL TOTAL KNEE REPLACEMENTS - BUT BOTH KNEES PAINFUL  AND  JOINTS WORN OUT; BAD RIGHT HIP - BUT NOT A CANDIDATE FOR HIP  PREPLACMENT;  HAS SEVERE LOWER  BACK AND LEG PAIN - HAS SPINAL  STENOSIS AND BULGING DISC; CURVATURE OF UPPER SPINE - UNABLE TO  LAY HIS HEAD FLAT.  Marland Kitchen Pneumonia 2005  . Difficult intubation     DUE TO LIMITED NECK FLEXION AND SHORT NECK--ANESTHESIA RECORD  FROM 2007 VATS SURGERY AT [facility] OBTAINED AND ON PT'S CHART.  Marland Kitchen Problems with hearing     WEARS BILATERAL HEARING AIDS   Past Surgical History:  Past Surgical History  Procedure Laterality Date  . Shoulder surgery      bilateral  . Pancreas surgery  2001    TUMOR REMOVED FROM PANCREAS AND SPLEEN ALSO REMOVED  . Total knee arthroplasty      bilateral  . Lung surgery  2007    non-cancer  VIDEO ASSISTED THORCOTOMY TO REMOVE FLUID /  DECORTICATION. DR. Edwyna Shell  . Basil cell    . Cardiac catheterization  2008    totally occluded nondominant LCX wth mod. ostial RCA disease  and nl. EF  . Esophageal stretch    . Skin cancer removed from left side of head - required skin  graft from left leg    . Cartilage repair 198- left knee    . Joint replacement    . Transurethral resection of bladder tumor with gyrus  (turbt-gyrus) N/A 11/03/2012    Procedure: TRANSURETHRAL RESECTION OF BLADDER TUMOR WITH GYRUS  (TURBT-GYRUS);  Surgeon: Milford Cage, MD;  Location: [facility]  [facility];  Service: Urology;  Laterality: N/A;  . Cystoscopy/retrograde/ureteroscopy  11/03/2012    Procedure: CYSTOSCOPY/RETROGRADE/ LEFT URETEROSCOPY AND STENT  PLACEMENT;  Surgeon: Milford Cage, MD;  Location: [facility];  Service: Urology;;  . Theador Hawthorne N/A 11/03/2012    Procedure: CYSTOGRAM;  Surgeon: Milford Cage, MD;   Location: [facility];  Service: Urology;  Laterality: N/A;   HPI:  Corie Vavra is a 79 y.o. male who was just discharged from  our service 6 days ago after an admission for a UTI with  pseudomonas.  Since discharge he has getting progressively short  of breath until today he developed cough, productive of a whitish   sputum.  He has had no fever, no chills, no sweating since Monday  of this week.  In the ED while his WBC is elevated at 17k this is  significantly down from the >30k it was at discharge 6  days ago.   At the time of discharge he had been taken off of his Zaroxylyn  and had not yet restarted this, he did restart his lasix on  Tuesday.  MBS indicated following results of BSE completed on  11/25/12.       Assessment / Plan / Recommendation Clinical Impression  Dysphagia Diagnosis: Moderate oral phase dysphagia;Severe  pharyngeal phase dysphagia;Severe cervical esophageal phase  dysphagia;Suspected primary esophageal dysphagia  MBS completed.  Patient with head down posture with difficulty   maintaining  upright positon.  Assist required to reposition  patient throughout study as view blocked by shoulders.   Evaluation indicates moderate oral dysphagia marked by weak  lingual manipulation and reduced posterior propulsion.  Piecemeal  swallows with all consistencies.  Severe pharyngeal phase  dysphagia with suspected primary esophageal dysphagia.  Decreased  TBR resulting in residuals in vallecular space with all  consistencies.  Reduced pharyngeal peristalsis.  Diffuse residue  from vallecular space to pyriforms s/p swallow of nectar barium  by cup and spoon, puree and mechanical soft consistency.   Prominent CP segment with pooling in pyriforms with all  consistencies prior to swallow with moderate amount of  containment s/p swallow.  Backflow into pharynx during swallow of  thin liquid barium by cup, straw and spoon with eventual  penetration s/p swallow from residuals.  Unable to confirm  aspiration with thins due to shoulders blocking view. Strategies  of multiple effortful swallows with puree consistency and nectar  thick liquids by straw  effective in clearing majority of  residuals but patient quickly fatigued. No backflow to pharynx  noted with puree and nectar thick barium.    Brief esophageal  sweep revealed  significant amount of stasis with backflow to  cervical esophagus.  No radiologist present to confirm.  Patient  at high risk for aspiration with any consistency due to residuals  and risk of backflow of bolus into pharynx from cervical  esophagus.   MD given results of evaluation by treating SLP.  MD  confirmed to proceed with dysphagia 1 consistency and nectar  thick liquids in limited amounts with full supervision with  strict aspiration and reflux precautions.  Aspiration risk  remains even with modified diet.   Diagnostic treatment completed  following evaluation focusing on providing education to  caregivers on swallow strategies to maximize safety.   Patient  may benefit from GI consult to assess current esophageal  functioning.  ST to follow in acute care setting for diet  tolerance and POC.   Recommend repeat MBS following results of GI  consult.         Diet Recommendation Dysphagia 1 (Puree);Nectar-thick liquid   Liquid Administration via: Straw;Cup Medication Administration: Via alternative means Supervision: Patient able to self feed;Full supervision/cueing  for compensatory strategies Compensations: Slow rate;Small sips/bites;Multiple dry swallows  after each bite/sip;Clear throat intermittently;Hard cough after  swallow;Effortful swallow Postural Changes and/or Swallow Maneuvers: Seated upright 90  degrees;Upright 30-60 min after meal    Other  Recommendations Recommended Consults: Consider GI  evaluation Oral Care Recommendations: Oral care before and after PO Other Recommendations: Order thickener from pharmacy;Prohibited  food (jello, ice cream, thin soups);Have oral suction  available;Clarify dietary restrictions   Follow Up Recommendations  Outpatient SLP    Frequency and Duration min 2x/week  2 weeks       SLP Swallow Goals Patient will utilize recommended strategies during swallow to  increase swallowing safety with: Total assistance   General Date of Onset:  11/26/12 HPI: Piers Baade is a  79 y.o. male who was just discharged  from our service 6 days ago after an admission for a UTI with  pseudomonas.  Since discharge he has getting progressively short  of breath until today he developed cough, productive of a whitish  sputum.  He has had no fever, no chills, no sweating since Monday  of this week.  In the ED while his WBC is elevated at 17k this is  significantly down from the >30k it was at discharge 6 days ago.   At the time of discharge he had been taken off of his Zaroxylyn  and had not yet restarted this, he did restart his lasix on  Tuesday. Type of Study: Modified Barium Swallowing Study Reason for Referral: Objectively evaluate swallowing function Previous Swallow Assessment: BSE 11/26/12 Diet Prior to this Study: NPO Temperature Spikes Noted: No Respiratory Status: Supplemental O2 delivered via (comment) History of Recent Intubation: No Behavior/Cognition: Alert;Cooperative;Hard of hearing;Requires  cueing Oral Cavity - Dentition: Missing dentition Oral Motor / Sensory Function: Impaired - see Bedside swallow  eval Self-Feeding Abilities: Able to feed self;Needs assist;Needs set  up Patient Positioning: Upright in chair Baseline Vocal Quality: Breathy;Hoarse;Low vocal intensity Volitional Cough: Strong Volitional Swallow: Able to elicit Pharyngeal Secretions: Not observed secondary MBS    Reason for Referral Objectively evaluate swallowing function   Oral Phase Oral Preparation/Oral Phase Oral Phase: Impaired Oral - Nectar Oral - Nectar Teaspoon: Weak lingual manipulation;Lingual  pumping;Incomplete tongue to palate contact;Holding of  bolus;Reduced posterior propulsion;Lingual/palatal  residue;Piecemeal swallowing;Delayed oral transit Oral - Nectar Cup: Weak lingual manipulation;Lingual  pumping;Incomplete tongue to palate contact;Reduced posterior  propulsion;Holding of bolus;Lingual/palatal residue;Piecemeal  swallowing Oral - Nectar Straw: Weak lingual manipulation;Lingual   pumping;Incomplete tongue to palate contact;Reduced posterior  propulsion;Holding of bolus;Piecemeal swallowing;Lingual/palatal  residue;Delayed oral transit Oral - Thin Oral - Thin Teaspoon: Weak lingual manipulation;Lingual  pumping;Incomplete tongue to palate contact;Impaired  mastication;Lingual/palatal residue;Piecemeal swallowing;Holding  of bolus;Reduced posterior propulsion;Delayed oral transit Oral - Thin Cup: Weak lingual manipulation;Lingual  pumping;Incomplete tongue to palate contact;Reduced posterior  propulsion;Holding of bolus;Lingual/palatal residue;Piecemeal  swallowing;Delayed oral transit Oral - Thin Straw: Weak lingual manipulation;Lingual  pumping;Incomplete tongue to palate contact;Reduced posterior  propulsion;Holding of bolus;Piecemeal swallowing;Lingual/palatal  residue Oral - Solids Oral - Puree: Weak lingual manipulation;Lingual pumping;Holding  of bolus;Reduced posterior propulsion;Lingual/palatal  residue;Piecemeal swallowing;Delayed oral transit Oral - Mechanical Soft: Impaired mastication;Weak lingual  manipulation;Lingual pumping;Incomplete tongue to palate  contact;Piecemeal swallowing;Lingual/palatal residue;Delayed oral  transit   Pharyngeal Phase Pharyngeal Phase Pharyngeal Phase: Impaired Pharyngeal - Nectar Pharyngeal - Nectar Teaspoon: Premature spillage to  valleculae;Delayed swallow initiation;Premature spillage to  pyriform sinuses;Reduced pharyngeal peristalsis;Reduced anterior  laryngeal mobility;Reduced laryngeal elevation;Reduced  airway/laryngeal closure;Reduced tongue base  retraction;Penetration/Aspiration during  swallow;Penetration/Aspiration after swallow;Pharyngeal residue -  pyriform sinuses;Pharyngeal residue - valleculae;Pharyngeal  residue - cp segment Penetration/Aspiration details (nectar teaspoon): Material enters  airway, CONTACTS cords then ejected out Pharyngeal - Nectar Cup: Premature spillage to  valleculae;Premature spillage to pyriform  sinuses;Delayed swallow  initiation;Reduced pharyngeal peristalsis;Reduced anterior  laryngeal mobility;Reduced laryngeal elevation;Reduced  airway/laryngeal closure;Reduced tongue base  retraction;Pharyngeal residue - pyriform sinuses;Pharyngeal  residue - valleculae;Pharyngeal residue - cp segment Pharyngeal - Thin Pharyngeal - Thin Teaspoon: Delayed swallow initiation;Premature  spillage to valleculae;Premature spillage to pyriform  sinuses;Reduced pharyngeal peristalsis;Reduced anterior laryngeal  mobility;Reduced tongue base retraction;Reduced airway/laryngeal  closure;Penetration/Aspiration after swallow;Pharyngeal residue -  pyriform sinuses;Pharyngeal residue - valleculae;Pharyngeal  residue - cp segment Penetration/Aspiration details (thin teaspoon): Material enters  airway, passes BELOW  cords and not ejected out despite cough  attempt by patient Pharyngeal - Thin Cup: Premature spillage to pyriform  sinuses;Delayed swallow initiation;Reduced pharyngeal  peristalsis;Reduced anterior laryngeal mobility;Reduced laryngeal  elevation;Reduced airway/laryngeal closure;Reduced tongue base  retraction;Penetration/Aspiration after swallow;Trace  aspiration;Pharyngeal residue - valleculae;Pharyngeal residue -  pyriform sinuses;Pharyngeal residue - posterior  pharnyx;Pharyngeal residue - cp segment Penetration/Aspiration details (thin cup): Material enters  airway, passes BELOW cords and not ejected out despite cough  attempt by patient Pharyngeal - Thin Straw: Premature spillage to pyriform  sinuses;Reduced pharyngeal peristalsis;Reduced anterior laryngeal  mobility;Reduced laryngeal elevation;Reduced airway/laryngeal  closure;Reduced tongue base retraction;Penetration/Aspiration  after swallow;Pharyngeal residue - pyriform sinuses;Pharyngeal  residue - posterior pharnyx;Pharyngeal residue - cp  segment;Pharyngeal residue - valleculae Penetration/Aspiration details (thin straw): Material enters  airway, passes BELOW  cords and not ejected out despite cough  attempt by patient Pharyngeal - Solids Pharyngeal - Puree: Premature spillage to valleculae;Reduced  pharyngeal peristalsis;Reduced anterior laryngeal  mobility;Reduced tongue base retraction;Reduced airway/laryngeal  closure;Reduced laryngeal elevation;Pharyngeal residue - pyriform  sinuses;Pharyngeal residue - posterior pharnyx;Pharyngeal residue  - valleculae;Pharyngeal residue - cp segment Pharyngeal - Mechanical Soft: Premature spillage to  valleculae;Reduced pharyngeal peristalsis;Reduced anterior  laryngeal mobility;Reduced laryngeal elevation;Reduced  airway/laryngeal closure;Reduced tongue base  retraction;Penetration/Aspiration after swallow;Pharyngeal  residue - posterior pharnyx;Pharyngeal residue - cp  segment;Pharyngeal residue - pyriform sinuses;Pharyngeal residue  - valleculae Penetration/Aspiration details (mechanical soft): Material enters  airway, passes BELOW cords then ejected out  Cervical Esophageal Phase    GO    Cervical Esophageal Phase Cervical Esophageal Phase: Impaired Cervical Esophageal Phase - Nectar Nectar Cup: Reduced cricopharyngeal relaxation;Prominent  cricopharyngeal segment;Esophageal backflow into cervical  esophagus Nectar Straw: Prominent cricopharyngeal segment;Esophageal  backflow into cervical esophagus Cervical Esophageal Phase - Thin Thin Cup: Reduced cricopharyngeal relaxation;Prominent  cricopharyngeal segment;Esophageal backflow into cervical  esophagus;Esophageal backflow into the pharynx Cervical Esophageal Phase - Solids Puree: Prominent cricopharyngeal segment;Esophageal backflow into  cervical esophagus;Reduced cricopharyngeal relaxation Mechanical Soft: Reduced cricopharyngeal relaxation;Prominent  cricopharyngeal segment        Moreen Fowler MS, CCC-SLP [phone] [facility] 11/27/2012, 5:25 PM     Dg Chest Port 1 View 12/06/2012   *RADIOLOGY REPORT*  Clinical Data: Difficulty breathing.  PORTABLE CHEST - 1 VIEW   Comparison: Chest x-ray 12/02/2012.  Findings: Compared to the prior study the patient has been extubated. There is a right-sided internal jugular central venous catheter with tip terminating in the superior cavoatrial junction. A feeding tube in the proximal stomach.  Compared to the prior examination, there is complete opacification of the left hemithorax.  There appears to be some leftward shift of cardiomediastinal structures, favoring the majority of this finding being related to atelectasis, although underlying airspace consolidation and/or pleural effusion is not excluded.  Lung volumes are low.  Crowding of the pulmonary vasculature, without frank pulmonary edema.  Cardiac silhouette is enlarged. Mediastinal contour is largely obscured.  Atherosclerosis in the thoracic aorta.  Multiple surgical clips project over the left upper quadrant of the abdomen.  What appears represent a left double-J ureteral stent is noted, but incompletely visualized.  IMPRESSION: 1.  Support apparatus, as above. 2.  Complete opacification of the left hemithorax, new compared to the prior study, favored to predominately reflect atelectasis.  These results were called by telephone on 12/06/2012 at 07:10 a.m. to nurse Victorino Dike (for Dr. Claiborne Billings), who verbally acknowledged these results.   Original Report Authenticated By: Trudie Reed, M.D.   Scheduled Meds: . antiseptic oral rinse  15 mL Mouth Rinse q12n4p  . chlordiazePOXIDE  5 mg Oral TID  . chlorhexidine  15 mL Mouth Rinse BID  . Chlorhexidine Gluconate Cloth  6 each Topical Q0600  . DULoxetine  30 mg Oral QHS  . famotidine  20 mg Oral QHS  . free water  200 mL Per Tube Q6H  . insulin aspart  0-20 Units Subcutaneous Q4H  . insulin glargine  20 Units Subcutaneous QHS  . irbesartan  150 mg Oral Daily  . isosorbide dinitrate  15 mg Per Tube BID  . multivitamin  5 mL Per Tube Daily  . mupirocin ointment  1 application Nasal BID  . nystatin cream   Topical BID  .  senna-docusate  1 tablet Oral BID  . Warfarin - Pharmacist Dosing Inpatient   Does not apply q1800   Continuous Infusions: . sodium chloride 20 mL/hr (12/04/12 2218)  . feeding supplement (JEVITY 1.2 CAL) 1,000 mL (12/04/12 1829)    Time spent: 35 minutes with > 50% of time discussing current diagnostic test results, clinical impression and plan of care with the patient's nephew.    LOS: 11 days   RAMA,CHRISTINA  [facility] Pager [phone].   *Please note that the hospitalists switch teams on Wednesdays. Please call the flow manager at [phone] if you are having difficulty reaching the hospitalist taking care of this patient as she can update you and provide the most up-to-date pager number of provider caring for the patient. If 8PM-8AM, please contact night-coverage at [email or web address], password [facility]  12/06/2012, 7:46 AM

## 2012-12-07 ENCOUNTER — Inpatient Hospital Stay (HOSPITAL_COMMUNITY): Payer: Medicare Other

## 2012-12-07 LAB — BASIC METABOLIC PANEL
BUN: 19 mg/dL (ref 6–23)
Chloride: 104 mEq/L (ref 96–112)
GFR calc Af Amer: >90 mL/min (ref >90)
Potassium: 4 mEq/L (ref 3.5–5.1)

## 2012-12-07 LAB — GLUCOSE, CAPILLARY
Glucose-Capillary: 179 mg/dL — ABNORMAL HIGH (ref 70–99)
Glucose-Capillary: 183 mg/dL — ABNORMAL HIGH (ref 70–99)
Glucose-Capillary: 187 mg/dL — ABNORMAL HIGH (ref 70–99)
Glucose-Capillary: 258 mg/dL — ABNORMAL HIGH (ref 70–99)

## 2012-12-07 LAB — CBC
HCT: 33.9 % — ABNORMAL LOW (ref 39.0–52.0)
Hemoglobin: 10.1 g/dL — ABNORMAL LOW (ref 13.0–17.0)
MCH: 29.6 pg (ref 26.0–34.0)
MCHC: 29.8 g/dL — ABNORMAL LOW (ref 30.0–36.0)
MCV: 99.4 fL (ref 78.0–100.0)
Platelets: 322 10*3/uL (ref 150–400)
RBC: 3.41 MIL/uL — ABNORMAL LOW (ref 4.22–5.81)
RDW: 15.5 % (ref 11.5–15.5)
WBC: 13.3 10*3/uL — ABNORMAL HIGH (ref 4.0–10.5)

## 2012-12-07 LAB — URINE CULTURE: Colony Count: >=100000

## 2012-12-07 LAB — PROTIME-INR: Prothrombin Time: 15.7 seconds — ABNORMAL HIGH (ref 11.6–15.2)

## 2012-12-07 MED ORDER — CHLORDIAZEPOXIDE HCL 5 MG PO CAPS: 5.0000 mg | ORAL_CAPSULE | Freq: Three times a day (TID) | ORAL | Status: DC

## 2012-12-07 MED ORDER — VANCOMYCIN HCL 10 G IV SOLR: 1250.0000 mg | Freq: Two times a day (BID) | INTRAVENOUS | Status: DC

## 2012-12-07 MED ORDER — FREE WATER
250.0000 mL | Status: DC
  Administered 2012-12-07 (×3): 250 mL

## 2012-12-07 MED ORDER — ACETYLCYSTEINE 20 % IN SOLN: 4.0000 mL | Freq: Four times a day (QID) | RESPIRATORY_TRACT | Status: DC

## 2012-12-07 MED ORDER — ALBUTEROL SULFATE (5 MG/ML) 0.5% IN NEBU: 2.5000 mg | INHALATION_SOLUTION | Freq: Four times a day (QID) | RESPIRATORY_TRACT | Status: DC

## 2012-12-07 MED ORDER — FREE WATER: 250.0000 mL | Status: DC

## 2012-12-07 MED ORDER — PIPERACILLIN-TAZOBACTAM 3.375 G IVPB: 3.3750 g | Freq: Three times a day (TID) | INTRAVENOUS | Status: DC

## 2012-12-07 MED ORDER — GUAIFENESIN 100 MG/5ML PO SYRP: 200.0000 mg | ORAL_SOLUTION | ORAL | Status: DC

## 2012-12-07 MED ORDER — FAMOTIDINE 20 MG PO TABS: 20.0000 mg | ORAL_TABLET | Freq: Every day | ORAL | Status: DC

## 2012-12-07 MED ORDER — INSULIN ASPART 100 UNIT/ML ~~LOC~~ SOLN: 0.0000 [IU] | SUBCUTANEOUS | Status: DC

## 2012-12-07 MED ORDER — CHLORHEXIDINE GLUCONATE 0.12 % MT SOLN: 15.0000 mL | Freq: Two times a day (BID) | OROMUCOSAL | Status: DC

## 2012-12-07 MED ORDER — FLUCONAZOLE IN SODIUM CHLORIDE 200-0.9 MG/100ML-% IV SOLN: 200.0000 mg | INTRAVENOUS | Status: DC

## 2012-12-07 MED ORDER — ADULT MULTIVITAMIN LIQUID CH: 5.0000 mL | Freq: Every day | ORAL | Status: DC

## 2012-12-07 MED ORDER — ISOSORBIDE DINITRATE 5 MG PO TABS: 15.0000 mg | ORAL_TABLET | Freq: Two times a day (BID) | ORAL | Status: DC

## 2012-12-07 MED ORDER — INSULIN GLARGINE 100 UNIT/ML ~~LOC~~ SOLN: 50.0000 [IU] | Freq: Every day | SUBCUTANEOUS | Status: DC

## 2012-12-07 MED ORDER — HYDRALAZINE HCL 20 MG/ML IJ SOLN: 10.0000 mg | INTRAMUSCULAR | Status: DC | PRN

## 2012-12-07 MED ORDER — POTASSIUM CHLORIDE 20 MEQ/15ML (10%) PO LIQD
20.0000 meq | Freq: Two times a day (BID) | ORAL | Status: DC
  Administered 2012-12-07: 20 meq
  Filled 2012-12-07 (×2): qty 15

## 2012-12-07 MED ORDER — FUROSEMIDE 10 MG/ML IJ SOLN
40.0000 mg | Freq: Once | INTRAMUSCULAR | Status: AC
  Administered 2012-12-07: 40 mg via INTRAVENOUS
  Filled 2012-12-07: qty 4

## 2012-12-07 MED ORDER — FLUCONAZOLE IN SODIUM CHLORIDE 200-0.9 MG/100ML-% IV SOLN
200.0000 mg | INTRAVENOUS | Status: DC
  Administered 2012-12-07: 200 mg via INTRAVENOUS
  Filled 2012-12-07: qty 100

## 2012-12-07 MED ORDER — ALBUTEROL SULFATE (5 MG/ML) 0.5% IN NEBU: 2.5000 mg | INHALATION_SOLUTION | Freq: Four times a day (QID) | RESPIRATORY_TRACT | Status: DC | PRN

## 2012-12-07 MED ORDER — ACETAMINOPHEN 160 MG/5ML PO SOLN: 650.0000 mg | Freq: Four times a day (QID) | ORAL | Status: DC | PRN

## 2012-12-07 MED ORDER — DULOXETINE HCL 30 MG PO CPEP: 30.0000 mg | ORAL_CAPSULE | Freq: Every day | ORAL | Status: DC

## 2012-12-07 MED ORDER — NYSTATIN 100000 UNIT/GM EX CREA: TOPICAL_CREAM | Freq: Two times a day (BID) | CUTANEOUS | Status: DC

## 2012-12-07 MED ORDER — JEVITY 1.2 CAL PO LIQD: 1000.0000 mL | ORAL | Status: DC

## 2012-12-07 MED ORDER — INSULIN GLARGINE 100 UNIT/ML ~~LOC~~ SOLN
50.0000 [IU] | Freq: Every day | SUBCUTANEOUS | Status: DC
  Filled 2012-12-07: qty 0.5

## 2012-12-07 MED ORDER — WARFARIN SODIUM 7.5 MG PO TABS
7.5000 mg | ORAL_TABLET | Freq: Once | ORAL | Status: DC
  Filled 2012-12-07: qty 1

## 2012-12-07 NOTE — Discharge Summary (Signed)
Physician Discharge Summary  Frederick [name] ZOX:[id number] DOB: March 20, 1930 DOA: 11/25/2012  PCP: Georgianne Fick, MD  Admit date: 11/25/2012 Discharge date: 12/07/2012  Recommendations for Follow-up by physician at [facility]:  1. Recommend close followup of PT/INR as the Frederick is on Diflucan which can cause supratherapeutic INR. 2. Recommend close followup of her glycemic control and adjustment of insulin as needed. 3. Recommend followup of final blood culture results. Preliminary culture positive for gram-positive cocci in pairs.  Discharge Diagnoses:  Principal Problem:    Acute respiratory failure with hypoxia Active Problems:    DIABETES MELLITUS, TYPE II, ON INSULIN    HYPERTENSION    ESOPHAGEAL STRICTURE    GERD    Atrial fibrillation, permanent    Coronary artery disease, last cath 2008 occluded nondom, LCX, wth moderate ostial RCA disease, normal LV function    Diastolic CHF    Acute renal failure    Hypokalemia    Acute respiratory failure    Aspiration pneumonia    Septic shock    Altered mental status    Acute delirium    Encephalopathy, toxic    Atelectasis    Collapse of left lung    Dysphagia, unspecified(787.20)    Adult failure to thrive    Weakness generalized    Palliative care encounter   Discharge Condition: Guarded.  Diet recommendation: N.p.o. Tube feeds per orders.  History of present illness:  Frederick [name] is an 79 y.o. male with a PMH of DM, hypertension, hyperlipidemia, coronary artery disease, atrial fibrillation, peripheral vascular disease, recent hospitalization for treatment of pseudomonal UTI who was admitted on 11/26/2012 with cough and chest x-ray findings of pulmonary edema a left-sided pleural effusion concerning for decompensated heart failure. On 11/28/2012, the Frederick's mental status became significantly altered with somnolence, and he was found to be hypercarbic and hypoxic with worsening respiratory  failure, ultimately requiring intubation. He was successfully extubated on 12/02/2012. On 12/03/2012, a speech therapy evaluation was performed and he was found to have significant dysphasia necessitating placement of a feeding tube for tube feeds. Because of failure to thrive and progressive decline, a palliative care consult was requested on 12/05/12. Family meeting to be held 12/06/12 is no decisions made towards the descalation of care. Frederick spiked a temperature overnight 12/05/2012-12/06/2012, and now has a collapsed left lung. Broad-spectrum antibiotics resumed 12/06/2012. Being discharged to [facility] for long-term acute care.Marland Kitchen  Hospital Course by problem:  Principal Problem:  Acute respiratory failure with hypoxia  -Multifactorial with aspiration pneumonia and CHF contributory.  -Intubated 11/28/2012-12/02/2012.  -Continue pulmonary toilet and increase mobilization, respiratory status tenuous.  -CXR shows some improvement today with improved aeration of left lung.  -Wife has agreed to no intubation status. Still a partial code. Active Problems:  Atelectasis / left lung collapse  -Noted on CXR 12/06/12. Followup x-ray today shows some improvement in aeration.  -Continue percussion per RT, Mucomyst/albuterol nebulizer treatments every 12 hours and guaifenesin every 4 hours via tube.  -Can consider BiPAP however with dysphasia and tube feeding, the Frederick is at high risk for further problems with aspiration and this may pose more harm than benefit.  Fever  -Temperature spike to 101.5 noted 12/06/2012. Portable chest x-ray, urinalysis and culture as well as blood cultures ordered by cross covering physician. Portable chest x-ray showed complete collapse of the left lung.  -Preliminary blood culture growing GPC in pairs, urine culture growing > 100,000 colonies of yeast. Will add diflucan (Watch INR closely as diflucan can increase INR)  -Broad-spectrum  antibiotics resumed after goals of care discussion held  with the Frederick's wife.  Hyponatremia  -Increase free water to 250 cc per tube every 4 hours.  DIABETES MELLITUS, TYPE II, ON INSULIN  -Currently on SSI insulin resistant scale every 4 hours and Lantus to 40 units daily.  -CBGs 179-258. Increase Lantus to 50 units daily (was on 46 units at home).  HYPERTENSION  -Continue hydralazine as needed.  -Continue Isordil, Avapro. Given a dose of Lasix 12/05/12.  History of ESOPHAGEAL STRICTURE, dysphagia  -Failed swallowing evaluation on 12/03/2012.  -Will need GI evaluation to determine if esophageal stricture playing a role in his current dysphasia and if so, if there is a recommended treatment. Discussed with Dr. Loreta Ave. Not stable enough for EGD at present, and with relative stability of his stricture (not needed esophageal dilatation in several years), Dr. Loreta Ave does not feel this is the likely etiology of his stricture.  -Panda tube placed with initiation of tube feeding per dietitian recommendations. Tolerating well.  -Will need ongoing discussion with family regarding a more permanent solution to his dysphasia/n.p.o. status.  GERD  -Continue Pepcid.  Atrial fibrillation, permanent  -Rate controlled, on chronic Coumadin.  Coronary artery disease, last cath 2008 occluded nondom, LCX, wth moderate ostial RCA disease, normal LV function  -Continue Isordil and Coumadin.  Diastolic CHF  -Status post cardiology consultation done on 11/26/2012. Recommended a more aggressive diuretic regimen upon discharge.  -Given a dose of Lasix on 12/05/2012 with subsequent 1.1 L diuresis. I and O. balance up again today, will repeat Lasix.  -Two-dimensional echocardiogram done 11/16/2012, so not repeated. Normal EF at that time.. ARB resumed.  Acute renal failure  -Resolved.  Hypokalemia  -Monitor and replace electrolytes as needed.  Septic shock secondary to Aspiration pneumonia  -Completed 4 days of therapy with vancomycin and 5 days of Zosyn.  -Blood and urine  cultures negative. Tracheal aspirate grew a few colonies of yeast.  -Broad-spectrum antibiotics resumed 12/06/2012 secondary to fever.  Delirium/toxic encephalopathy/Altered mental status  -No history of advanced dementia, as initially reported.  -Likely toxic encephalopathy from acute illness and hypoxia/hypercarbia.  -Still having periods of confusion per wife, with sundowning.  -Continues to require intermittent safety mittens.   Medical Consultants:  Dr. Kalman Shan, Pulmonology.  Dr. Nanetta Batty, Cardiology  Lorinda Creed, NP, Palliative care   Discharge Exam: Filed Vitals:   12/07/12 0545  BP: 143/85  Pulse: 104  Temp: 99.2 F (37.3 C)  Resp: 20   Filed Vitals:   12/06/12 2100 12/07/12 0545 12/07/12 0751 12/07/12 0827  BP: 141/84 143/85    Pulse: 100 104    Temp: 99.1 F (37.3 C) 99.2 F (37.3 C)    TempSrc: Axillary Axillary    Resp: 20 20    Height:      Weight:  101.6 kg (223 lb 15.8 oz)  104 kg (229 lb 4.5 oz)  SpO2: 97% 98% 92%     Gen: Awake, alert  Cardiovascular: Heart sounds are irregular  Respiratory: Lungs diminished on the left with scattered rhonchi and increased upper airway congestion  Gastrointestinal: Abdomen soft, NT/ND, + BS  Extremities: 1+ edema  Discharge Instructions  Discharge Orders   Future Appointments Provider Department Dept Phone   12/15/2012 10:15 AM Runell Gess, MD [facility] [place] [phone]   Future Orders Complete By Expires   Call MD for:  persistant nausea and vomiting  As directed    Call MD for:  severe  uncontrolled pain  As directed    Call MD for:  temperature >100.4  As directed    Discharge instructions  As directed    Comments:     Nothing by mouth: TF per order only.   Increase activity slowly  As directed        Medication List    STOP taking these medications       atorvastatin 40 MG tablet  Commonly known as:  LIPITOR     ciprofloxacin 500 MG tablet   Commonly known as:  CIPRO     furosemide 40 MG tablet  Commonly known as:  LASIX     glipiZIDE 10 MG tablet  Commonly known as:  GLUCOTROL     HYDROcodone-acetaminophen 5-325 MG per tablet  Commonly known as:  NORCO/VICODIN     insulin lispro 100 UNIT/ML injection  Commonly known as:  HUMALOG     isosorbide mononitrate 60 MG 24 hr tablet  Commonly known as:  IMDUR     lansoprazole 30 MG capsule  Commonly known as:  PREVACID     metolazone 5 MG tablet  Commonly known as:  ZAROXOLYN     phenazopyridine 100 MG tablet  Commonly known as:  PYRIDIUM     potassium chloride SA 20 MEQ tablet  Commonly known as:  K-DUR,KLOR-CON      TAKE these medications       acetaminophen 160 MG/5ML solution  Commonly known as:  TYLENOL  Place 20.3 mLs (650 mg total) into feeding tube every 6 (six) hours as needed.     acetylcysteine 20 % nebulizer solution  Commonly known as:  MUCOMYST  Take 4 mLs by nebulization 4 (four) times daily.     albuterol (5 MG/ML) 0.5% nebulizer solution  Commonly known as:  PROVENTIL  Take 0.5 mLs (2.5 mg total) by nebulization 4 (four) times daily.     albuterol (5 MG/ML) 0.5% nebulizer solution  Commonly known as:  PROVENTIL  Take 0.5 mLs (2.5 mg total) by nebulization every 6 (six) hours as needed for wheezing or shortness of breath.     chlordiazePOXIDE 5 MG capsule  Commonly known as:  LIBRIUM  Take 1 capsule (5 mg total) by mouth 3 (three) times daily.     chlorhexidine 0.12 % solution  Commonly known as:  PERIDEX  Use as directed 15 mLs in the mouth or throat 2 (two) times daily.     DULoxetine 30 MG capsule  Commonly known as:  CYMBALTA  Take 1 capsule (30 mg total) by mouth at bedtime.     famotidine 20 MG tablet  Commonly known as:  PEPCID  Take 1 tablet (20 mg total) by mouth at bedtime.     feeding supplement (JEVITY 1.2 CAL) Liqd  Place 1,000 mLs into feeding tube continuous.     fluconazole 2 mg/mL IVPB  Commonly known as:   DIFLUCAN  Inject 100 mLs (200 mg total) into the vein daily.     free water Soln  Place 250 mLs into feeding tube every 4 (four) hours.     guaifenesin 100 MG/5ML syrup  Commonly known as:  ROBITUSSIN  Place 10 mLs (200 mg total) into feeding tube every 4 (four) hours.     hydrALAZINE 20 MG/ML injection  Commonly known as:  APRESOLINE  Inject 0.5 mLs (10 mg total) into the vein every 4 (four) hours as needed (elevated BP).     hyoscyamine 0.125 MG tablet  Commonly known as:  LEVSIN, ANASPAZ  Take 1 tablet (0.125 mg total) by mouth every 4 (four) hours as needed for cramping (bladder spasms).     insulin aspart 100 UNIT/ML injection  Commonly known as:  novoLOG  Inject 0-20 Units into the skin every 4 (four) hours.     insulin glargine 100 UNIT/ML injection  Commonly known as:  LANTUS  Inject 0.5 mLs (50 Units total) into the skin daily. Daily with breakfast     irbesartan 150 MG tablet  Commonly known as:  AVAPRO  Take 150 mg by mouth daily with breakfast. -HOLD UNTIL FOLLOW UP WITH PRIMARY CARE DOCTOR     isosorbide dinitrate 5 MG tablet  Commonly known as:  ISORDIL  Place 3 tablets (15 mg total) into feeding tube 2 (two) times daily.     liver oil-zinc oxide 40 % ointment  Commonly known as:  DESITIN  Apply 1 application topically daily as needed for dry skin. Apply to bottom     multivitamin Liqd  Place 5 mLs into feeding tube daily.     nystatin cream  Commonly known as:  MYCOSTATIN  Apply topically 2 (two) times daily.     piperacillin-tazobactam 3.375 GM/50ML IVPB  Commonly known as:  ZOSYN  Inject 50 mLs (3.375 g total) into the vein every 8 (eight) hours.     senna-docusate 8.6-50 MG per tablet  Commonly known as:  SENOKOT S  Take 1 tablet by mouth 2 (two) times daily.     sodium chloride 0.9 % SOLN 250 mL with vancomycin 10 G SOLR 1,250 mg  Inject 1,250 mg into the vein every 12 (twelve) hours.     warfarin 5 MG tablet  Commonly known as:  COUMADIN   Take 5 mg by mouth daily.           Follow-up Information   Follow up with [facility], [place]. (As recommended by discharging physician from [facility])    Specialty:  Internal Medicine   Contact information:   [address] [place] [address] [phone]        The results of significant diagnostics from this hospitalization (including imaging, microbiology, ancillary and laboratory) are listed below for reference.    Significant Diagnostic Studies: Dg Chest 2 View 11/25/2012 *RADIOLOGY REPORT* Clinical Data: Cough and chest congestion. CHEST - 2 VIEW Comparison: 11/15/2012 Findings: Shallow inspiration. Mild cardiac enlargement with pulmonary vascular congestion and central interstitial changes suggesting edema. This is progressing since the previous study. There is superimposed infiltration or atelectasis in the left lung base. Small left pleural effusion is not excluded. No pneumothorax. Calcified and tortuous aorta. Degenerative changes in the spine and shoulders. Surgical clips in the left upper quadrant. Left ureteral stent. IMPRESSION: Cardiac enlargement with developing pulmonary vascular congestion and perihilar edema. Infiltration or atelectasis in the left lung base. Possible left pleural effusion. Original Report Authenticated By: Burman Nieves, M.D.  Dg Abd 1 View 12/03/2012 *RADIOLOGY REPORT* Clinical Data: Panda placement confirmation. ABDOMEN - 1 VIEW Comparison: CT abdomen pelvis 11/15/2012 Findings: Enteric tube is seen with the tip overlying the expected location of the proximal stomach. Proximal and mid portions of a left double J ureteral stent are seen and appear properly positioned. Surgical clips are again noted in the left upper quadrant. Nonobstructive bowel gas pattern. No acute osseous abnormality. IMPRESSION: 1. Enteric tube tip overlying the stomach. 2. No acute findings. Original Report Authenticated By: Jerene Dilling, M.D.  Dg Chest  Port 1 View 12/02/2012 *RADIOLOGY REPORT* Clinical  Data: Endotracheal tube position. PORTABLE CHEST - 1 VIEW Comparison: December 01, 2012. Findings: Endotracheal tube is in grossly good position with distal tip 2 cm above the carina. Stable cardiomegaly is noted. No change is noted in position of right internal jugular catheter. Mild central pulmonary vascular congestion is again noted. Left basilar opacity is again noted and unchanged concerning for atelectasis and possible associated pleural effusion. IMPRESSION: Endotracheal tube in grossly good position. Stable left basilar opacity. Original Report Authenticated By: Lupita Raider., M.D.  Dg Chest Port 1 View 12/01/2012 *RADIOLOGY REPORT* Clinical Data: Evaluate endotracheal tube PORTABLE CHEST - 1 VIEW Comparison: Prior chest x-ray 11/27/2012 Findings: The tip of the endotracheal tube is 1.7 cm above the carina. Stable position of right IJ central venous catheter with the tip at the superior cavoatrial junction. The tip of the nasogastric tube projects over the stomach. Slightly improved left lower lobe collapse. Persistent bilateral layering effusions and associated bibasilar opacities. Multiple surgical clips project over the left upper quadrant and mid epigastric region. Incompletely imaged left double-J ureteral stent. Stable cardiomegaly and aortic atherosclerosis. IMPRESSION: 1. Slightly improved left lower lobe atelectasis/collapse. 2. Persistent left greater than right layering pleural effusions and associated bibasilar atelectasis versus infiltrate 3. Stable support apparatus. The tip of the endotracheal tube is 1.7 cm above the carina. Original Report Authenticated By: Malachy Moan, M.D.  Dg Chest Port 1 View 11/30/2012 *RADIOLOGY REPORT* Clinical Data: Atelectasis, endotracheal tube placement. PORTABLE CHEST - 1 VIEW Comparison: 11/29/2012. Findings: Endotracheal tube terminates approximately 1.7 cm above the carina. Nasogastric tube is followed  into the stomach. Right IJ central line tip projects over the SVC. Heart size is grossly stable. Left lower lobe collapse/consolidation and left pleural effusion persist. Lungs are overall low in volume with mild right basilar air space disease as well. Surgical clips are seen in the upper abdomen. IMPRESSION: 1. Persistent left lower lobe collapse/consolidation. Follow-up to clearing is recommended. 2. Mild bibasilar air space disease. Original Report Authenticated By: Leanna Battles, M.D.  Dg Chest Port 1 View 11/29/2012 *RADIOLOGY REPORT* Clinical Data: Respiratory failure. PORTABLE CHEST - 1 VIEW Comparison: 11/28/2012 Findings: Endotracheal tube remains with the tip approximately 2 cm above the carina. Central line and nasogastric tube positioning are stable. Lungs show relatively stable left lower lobe atelectasis. No pulmonary edema or pleural effusions are identified. Heart size is stable. IMPRESSION: Stable left lower lobe atelectasis. Original Report Authenticated By: Irish Lack, M.D.  Dg Chest Port 1 View 11/28/2012 *RADIOLOGY REPORT* Clinical Data: Respiratory distress. Intubation, central venous catheter placement and nasogastric tube placement. PORTABLE CHEST - 1 VIEW 11/28/2012 1029 hours: Comparison: Portable chest x-ray earlier same day 0851 hours. Findings: Endotracheal tube tip projects approximately 2 cm above carina. Right jugular central venous catheter tip projects over the lower SVC. No evidence of pneumothorax mediastinal hematoma. Nasogastric tube tip in the fundus the stomach. Cardiac silhouette enlarged but stable. Mild pulmonary venous hypertension, improved since earlier in the morning, without overt edema currently. Slight improved aeration in the left lower lobe, though moderate consolidation persist. No new pulmonary parenchymal abnormalities. IMPRESSION: 1. Endotracheal tube tip approximately 2 cm above the carina. 2. Right jugular central venous catheter tip in the lower SVC.  No acute complicating features. 3. Nasogastric tube tip in the gastric fundus. 4. Interval resolution of interstitial pulmonary edema since earlier same date. Improved aeration in the left lower lobe, though moderate atelectasis and/or pneumonia persists. No new abnormalities. Original Report Authenticated By: Hulan Saas, M.D.  Dg  Chest Port 1v Same Day 11/28/2012 *RADIOLOGY REPORT* Clinical Data: Lethargy. Respiratory distress. PORTABLE CHEST - 1 VIEW SAME DAY Comparison: 11/25/2012 Findings: The study is somewhat limited by respiratory motion, low lung volumes and a semi-erect rotated positioning. Left lung base opacity noted previously is stable most likely atelectasis although infiltrate is possible. There is some central vascular congestion. Mild right perihilar airspace opacity is suggested which may reflect asymmetric edema. Cardiac silhouette is mildly enlarged. No pneumothorax is appreciated. IMPRESSION: Allowing for differences in Frederick positioning and technique, findings are similar to the prior exam. There may be mild pulmonary edema although this appearance is accentuated by low lung volumes and motion artifact. Left lung base opacities most likely atelectasis. Original Report Authenticated By: Amie Portland, M.D.  Dg Swallowing Func-speech Pathology 11/27/2012 Lacinda Axon, CCC-SLP 11/27/2012 6:07 PM Objective Swallowing Evaluation: Modified Barium Swallowing Study Frederick [name] MRN: [id number] Date of Birth: 05-29-1929 Today's Date: 11/27/2012 Time: 1700-1730 SLP Time Calculation (min): 30 min Past Medical History: Past Medical History Diagnosis Date . Diabetes mellitus . Hypertension . Hypercholesterolemia . Esophageal stricture . Pulmonary embolism 2008 chronic coumadin . Coronary artery disease DR. BERRY IS PT'S CARDIOLOGIST . Atrial fibrillation CHRONIC COUMADIN-pemanent atrial fib . Anxiety SINCE 1975 . Depression SINCE 1975 . Shortness of breath NOT SURE WHAT CAUSES  SOB - BUT HE IS NOT ABLE TO BE ACTIVE - AND IS SOB WITH ANY ACTIVITY - USES OXYGEN 2 L NASAL CANNULA - ALL THE TIME -EXCEPT WHEN IN SHOWER OR CHANGING CLOTHES on home 02 . Peripheral vascular disease TOLD SOME SMALL AMOUNT OF BLOCKAGE IN LEG WHEN HEART CATH DONE 2008 . Bladder tumor HAS HAD HEMATURIA OFF AND ON - HAS FOLEY CATH THAT WAS PLACED JUNE 20TH, 2014 . GERD (gastroesophageal reflux disease) . Edema FEET AND LEGS MOST DAYS - SOMETIMES WEARS COMPRESSION HOSE . Cancer SKIN CANCERS . Arthritis S/P BILATERAL TOTAL KNEE REPLACEMENTS - BUT BOTH KNEES PAINFUL AND JOINTS WORN OUT; BAD RIGHT HIP - BUT NOT A CANDIDATE FOR HIP PREPLACMENT; HAS SEVERE LOWER BACK AND LEG PAIN - HAS SPINAL STENOSIS AND BULGING DISC; CURVATURE OF UPPER SPINE - UNABLE TO LAY HIS HEAD FLAT. Marland Kitchen Pneumonia 2005 . Difficult intubation DUE TO LIMITED NECK FLEXION AND SHORT NECK--ANESTHESIA RECORD FROM 2007 VATS SURGERY AT [facility] OBTAINED AND ON PT'S CHART. Marland Kitchen Problems with hearing WEARS BILATERAL HEARING AIDS Past Surgical History: Past Surgical History Procedure Laterality Date . Shoulder surgery bilateral . Pancreas surgery 2001 TUMOR REMOVED FROM PANCREAS AND SPLEEN ALSO REMOVED . Total knee arthroplasty bilateral . Lung surgery 2007 non-cancer VIDEO ASSISTED THORCOTOMY TO REMOVE FLUID / DECORTICATION. DR. Edwyna Shell . Basil cell . Cardiac catheterization 2008 totally occluded nondominant LCX wth mod. ostial RCA disease and nl. EF . Esophageal stretch . Skin cancer removed from left side of head - required skin graft from left leg . Cartilage repair 198- left knee . Joint replacement . Transurethral resection of bladder tumor with gyrus (turbt-gyrus) N/A 11/03/2012 Procedure: TRANSURETHRAL RESECTION OF BLADDER TUMOR WITH GYRUS (TURBT-GYRUS); Surgeon: Milford Cage, MD; Location: [facility]; Service: Urology; Laterality: N/A; . Cystoscopy/retrograde/ureteroscopy 11/03/2012 Procedure: CYSTOSCOPY/RETROGRADE/ LEFT URETEROSCOPY AND STENT PLACEMENT;  Surgeon: Milford Cage, MD; Location: [facility]; Service: Urology;; . Theador Hawthorne N/A 11/03/2012 Procedure: CYSTOGRAM; Surgeon: Milford Cage, MD; Location: [facility]; Service: Urology; Laterality: N/A; HPI: Frederick [name] is a 79 y.o. male who was just discharged from our service 6 days ago after an admission for a UTI with pseudomonas. Since discharge  he has getting progressively short of breath until today he developed cough, productive of a whitish sputum. He has had no fever, no chills, no sweating since Monday of this week. In the ED while his WBC is elevated at 17k this is significantly down from the >30k it was at discharge 6 days ago. At the time of discharge he had been taken off of his Zaroxylyn and had not yet restarted this, he did restart his lasix on Tuesday. MBS indicated following results of BSE completed on 11/25/12. Assessment / Plan / Recommendation Clinical Impression Dysphagia Diagnosis: Moderate oral phase dysphagia;Severe pharyngeal phase dysphagia;Severe cervical esophageal phase dysphagia;Suspected primary esophageal dysphagia MBS completed. Frederick with head down posture with difficulty maintaining upright positon. Assist required to reposition Frederick throughout study as view blocked by shoulders. Evaluation indicates moderate oral dysphagia marked by weak lingual manipulation and reduced posterior propulsion. Piecemeal swallows with all consistencies. Severe pharyngeal phase dysphagia with suspected primary esophageal dysphagia. Decreased TBR resulting in residuals in vallecular space with all consistencies. Reduced pharyngeal peristalsis. Diffuse residue from vallecular space to pyriforms s/p swallow of nectar barium by cup and spoon, puree and mechanical soft consistency. Prominent CP segment with pooling in pyriforms with all consistencies prior to swallow with moderate amount of containment s/p swallow. Backflow into pharynx during swallow of thin liquid barium by cup, straw  and spoon with eventual penetration s/p swallow from residuals. Unable to confirm aspiration with thins due to shoulders blocking view. Strategies of multiple effortful swallows with puree consistency and nectar thick liquids by straw effective in clearing majority of residuals but Frederick quickly fatigued. No backflow to pharynx noted with puree and nectar thick barium. Brief esophageal sweep revealed significant amount of stasis with backflow to cervical esophagus. No radiologist present to confirm. Frederick at high risk for aspiration with any consistency due to residuals and risk of backflow of bolus into pharynx from cervical esophagus. MD given results of evaluation by treating SLP. MD confirmed to proceed with dysphagia 1 consistency and nectar thick liquids in limited amounts with full supervision with strict aspiration and reflux precautions. Aspiration risk remains even with modified diet. Diagnostic treatment completed following evaluation focusing on providing education to caregivers on swallow strategies to maximize safety. Frederick may benefit from GI consult to assess current esophageal functioning. ST to follow in acute care setting for diet tolerance and POC. Recommend repeat MBS following results of GI consult. Diet Recommendation Dysphagia 1 (Puree);Nectar-thick liquid Liquid Administration via: Straw;Cup Medication Administration: Via alternative means Supervision: Frederick able to self feed;Full supervision/cueing for compensatory strategies Compensations: Slow rate;Small sips/bites;Multiple dry swallows after each bite/sip;Clear throat intermittently;Hard cough after swallow;Effortful swallow Postural Changes and/or Swallow Maneuvers: Seated upright 90 degrees;Upright 30-60 min after meal Other Recommendations Recommended Consults: Consider GI evaluation Oral Care Recommendations: Oral care before and after PO Other Recommendations: Order thickener from pharmacy;Prohibited food (jello, ice cream,  thin soups);Have oral suction available;Clarify dietary restrictions Follow Up Recommendations Outpatient SLP Frequency and Duration min 2x/week 2 weeks SLP Swallow Goals Frederick will utilize recommended strategies during swallow to increase swallowing safety with: Total assistance General Date of Onset: 11/26/12 HPI: Frederick [name] is a 79 y.o. male who was just discharged from our service 6 days ago after an admission for a UTI with pseudomonas. Since discharge he has getting progressively short of breath until today he developed cough, productive of a whitish sputum. He has had no fever, no chills, no sweating since Monday of this week. In the ED while  his WBC is elevated at 17k this is significantly down from the >30k it was at discharge 6 days ago. At the time of discharge he had been taken off of his Zaroxylyn and had not yet restarted this, he did restart his lasix on Tuesday. Type of Study: Modified Barium Swallowing Study Reason for Referral: Objectively evaluate swallowing function Previous Swallow Assessment: BSE 11/26/12 Diet Prior to this Study: NPO Temperature Spikes Noted: No Respiratory Status: Supplemental O2 delivered via (comment) History of Recent Intubation: No Behavior/Cognition: Alert;Cooperative;Hard of hearing;Requires cueing Oral Cavity - Dentition: Missing dentition Oral Motor / Sensory Function: Impaired - see Bedside swallow eval Self-Feeding Abilities: Able to feed self;Needs assist;Needs set up Frederick Positioning: Upright in chair Baseline Vocal Quality: Breathy;Hoarse;Low vocal intensity Volitional Cough: Strong Volitional Swallow: Able to elicit Pharyngeal Secretions: Not observed secondary MBS Reason for Referral Objectively evaluate swallowing function Oral Phase Oral Preparation/Oral Phase Oral Phase: Impaired Oral - Nectar Oral - Nectar Teaspoon: Weak lingual manipulation;Lingual pumping;Incomplete tongue to palate contact;Holding of bolus;Reduced posterior  propulsion;Lingual/palatal residue;Piecemeal swallowing;Delayed oral transit Oral - Nectar Cup: Weak lingual manipulation;Lingual pumping;Incomplete tongue to palate contact;Reduced posterior propulsion;Holding of bolus;Lingual/palatal residue;Piecemeal swallowing Oral - Nectar Straw: Weak lingual manipulation;Lingual pumping;Incomplete tongue to palate contact;Reduced posterior propulsion;Holding of bolus;Piecemeal swallowing;Lingual/palatal residue;Delayed oral transit Oral - Thin Oral - Thin Teaspoon: Weak lingual manipulation;Lingual pumping;Incomplete tongue to palate contact;Impaired mastication;Lingual/palatal residue;Piecemeal swallowing;Holding of bolus;Reduced posterior propulsion;Delayed oral transit Oral - Thin Cup: Weak lingual manipulation;Lingual pumping;Incomplete tongue to palate contact;Reduced posterior propulsion;Holding of bolus;Lingual/palatal residue;Piecemeal swallowing;Delayed oral transit Oral - Thin Straw: Weak lingual manipulation;Lingual pumping;Incomplete tongue to palate contact;Reduced posterior propulsion;Holding of bolus;Piecemeal swallowing;Lingual/palatal residue Oral - Solids Oral - Puree: Weak lingual manipulation;Lingual pumping;Holding of bolus;Reduced posterior propulsion;Lingual/palatal residue;Piecemeal swallowing;Delayed oral transit Oral - Mechanical Soft: Impaired mastication;Weak lingual manipulation;Lingual pumping;Incomplete tongue to palate contact;Piecemeal swallowing;Lingual/palatal residue;Delayed oral transit Pharyngeal Phase Pharyngeal Phase Pharyngeal Phase: Impaired Pharyngeal - Nectar Pharyngeal - Nectar Teaspoon: Premature spillage to valleculae;Delayed swallow initiation;Premature spillage to pyriform sinuses;Reduced pharyngeal peristalsis;Reduced anterior laryngeal mobility;Reduced laryngeal elevation;Reduced airway/laryngeal closure;Reduced tongue base retraction;Penetration/Aspiration during swallow;Penetration/Aspiration after swallow;Pharyngeal residue  - pyriform sinuses;Pharyngeal residue - valleculae;Pharyngeal residue - cp segment Penetration/Aspiration details (nectar teaspoon): Material enters airway, CONTACTS cords then ejected out Pharyngeal - Nectar Cup: Premature spillage to valleculae;Premature spillage to pyriform sinuses;Delayed swallow initiation;Reduced pharyngeal peristalsis;Reduced anterior laryngeal mobility;Reduced laryngeal elevation;Reduced airway/laryngeal closure;Reduced tongue base retraction;Pharyngeal residue - pyriform sinuses;Pharyngeal residue - valleculae;Pharyngeal residue - cp segment Pharyngeal - Thin Pharyngeal - Thin Teaspoon: Delayed swallow initiation;Premature spillage to valleculae;Premature spillage to pyriform sinuses;Reduced pharyngeal peristalsis;Reduced anterior laryngeal mobility;Reduced tongue base retraction;Reduced airway/laryngeal closure;Penetration/Aspiration after swallow;Pharyngeal residue - pyriform sinuses;Pharyngeal residue - valleculae;Pharyngeal residue - cp segment Penetration/Aspiration details (thin teaspoon): Material enters airway, passes BELOW cords and not ejected out despite cough attempt by Frederick Pharyngeal - Thin Cup: Premature spillage to pyriform sinuses;Delayed swallow initiation;Reduced pharyngeal peristalsis;Reduced anterior laryngeal mobility;Reduced laryngeal elevation;Reduced airway/laryngeal closure;Reduced tongue base retraction;Penetration/Aspiration after swallow;Trace aspiration;Pharyngeal residue - valleculae;Pharyngeal residue - pyriform sinuses;Pharyngeal residue - posterior pharnyx;Pharyngeal residue - cp segment Penetration/Aspiration details (thin cup): Material enters airway, passes BELOW cords and not ejected out despite cough attempt by Frederick Pharyngeal - Thin Straw: Premature spillage to pyriform sinuses;Reduced pharyngeal peristalsis;Reduced anterior laryngeal mobility;Reduced laryngeal elevation;Reduced airway/laryngeal closure;Reduced tongue base  retraction;Penetration/Aspiration after swallow;Pharyngeal residue - pyriform sinuses;Pharyngeal residue - posterior pharnyx;Pharyngeal residue - cp segment;Pharyngeal residue - valleculae Penetration/Aspiration details (thin straw): Material enters airway, passes BELOW cords and not ejected out despite cough attempt by Frederick Pharyngeal - Solids Pharyngeal -  Puree: Premature spillage to valleculae;Reduced pharyngeal peristalsis;Reduced anterior laryngeal mobility;Reduced tongue base retraction;Reduced airway/laryngeal closure;Reduced laryngeal elevation;Pharyngeal residue - pyriform sinuses;Pharyngeal residue - posterior pharnyx;Pharyngeal residue - valleculae;Pharyngeal residue - cp segment Pharyngeal - Mechanical Soft: Premature spillage to valleculae;Reduced pharyngeal peristalsis;Reduced anterior laryngeal mobility;Reduced laryngeal elevation;Reduced airway/laryngeal closure;Reduced tongue base retraction;Penetration/Aspiration after swallow;Pharyngeal residue - posterior pharnyx;Pharyngeal residue - cp segment;Pharyngeal residue - pyriform sinuses;Pharyngeal residue - valleculae Penetration/Aspiration details (mechanical soft): Material enters airway, passes BELOW cords then ejected out Cervical Esophageal Phase GO Cervical Esophageal Phase Cervical Esophageal Phase: Impaired Cervical Esophageal Phase - Nectar Nectar Cup: Reduced cricopharyngeal relaxation;Prominent cricopharyngeal segment;Esophageal backflow into cervical esophagus Nectar Straw: Prominent cricopharyngeal segment;Esophageal backflow into cervical esophagus Cervical Esophageal Phase - Thin Thin Cup: Reduced cricopharyngeal relaxation;Prominent cricopharyngeal segment;Esophageal backflow into cervical esophagus;Esophageal backflow into the pharynx Cervical Esophageal Phase - Solids Puree: Prominent cricopharyngeal segment;Esophageal backflow into cervical esophagus;Reduced cricopharyngeal relaxation Mechanical Soft: Reduced cricopharyngeal  relaxation;Prominent cricopharyngeal segment Moreen Fowler MS, CCC-SLP [phone] [facility] 11/27/2012, 5:25 PM  Dg Chest Port 1 View 12/06/2012 *RADIOLOGY REPORT* Clinical Data: Difficulty breathing. PORTABLE CHEST - 1 VIEW Comparison: Chest x-ray 12/02/2012. Findings: Compared to the prior study the Frederick has been extubated. There is a right-sided internal jugular central venous catheter with tip terminating in the superior cavoatrial junction. A feeding tube in the proximal stomach. Compared to the prior examination, there is complete opacification of the left hemithorax. There appears to be some leftward shift of cardiomediastinal structures, favoring the majority of this finding being related to atelectasis, although underlying airspace consolidation and/or pleural effusion is not excluded. Lung volumes are low. Crowding of the pulmonary vasculature, without frank pulmonary edema. Cardiac silhouette is enlarged. Mediastinal contour is largely obscured. Atherosclerosis in the thoracic aorta. Multiple surgical clips project over the left upper quadrant of the abdomen. What appears represent a left double-J ureteral stent is noted, but incompletely visualized. IMPRESSION: 1. Support apparatus, as above. 2. Complete opacification of the left hemithorax, new compared to the prior study, favored to predominately reflect atelectasis. These results were called by telephone on 12/06/2012 at 07:10 a.m. to nurse Victorino Dike (for Dr. Claiborne Billings), who verbally acknowledged these results. Original Report Authenticated By: Trudie Reed, M.D.   Labs:  Basic Metabolic Panel:  Recent Labs Lab 12/01/12 1478 12/02/12 0330  12/03/12 0400 12/03/12 2328 12/04/12 0515 12/06/12 0545 12/07/12 0525  NA 143  --   < > 147* 143 146* 147* 146*  K 3.8  --   < > 3.8 4.1 3.9 4.0 4.0  CL 102  --   < > 112 108 109 106 104  CO2 30  --   < > 30 31 30  37* 40*  GLUCOSE 121*  --   < > 157* 221* 300* 254* 235*  BUN 62*  --   < > 21 19  20 18 19   CREATININE 2.02*  --   < > 0.89 0.84 0.83 0.82 0.83  CALCIUM 9.7  --   < > 9.4 9.1 9.2 9.4 9.4  MG 2.1 2.0  --   --   --  1.9  --   --   PHOS 3.6 2.4  --   --   --  2.2*  --   --   < > = values in this interval not displayed. GFR Estimated Creatinine Clearance: 81.5 ml/min (by C-G formula based on Cr of 0.83).  Coagulation profile  Recent Labs Lab 12/03/12 0400 12/04/12 0515 12/05/12 0630 12/06/12 0545 12/07/12 0525  INR 2.21* 2.68* 1.92*  1.37 1.28    CBC:  Recent Labs Lab 12/01/12 0437 12/03/12 0400 12/04/12 0515 12/06/12 0545 12/07/12 0525  WBC 12.6* 13.9* 13.3* 13.5* 13.3*  NEUTROABS  --  9.5* 10.6*  --   --   HGB 10.2* 9.8* 10.1* 10.4* 10.1*  HCT 32.0* 31.8* 33.5* 34.5* 33.9*  MCV 94.1 98.1 98.5 99.4 99.4  PLT 256 340 342 359 322   Cardiac Enzymes:  Recent Labs Lab 12/01/12 1200 12/02/12 0330  CKTOTAL 44 39  CKMB 3.6  --    CBG:  Recent Labs Lab 12/06/12 2051 12/07/12 0006 12/07/12 0427 12/07/12 0814 12/07/12 1202  GLUCAP 244* 258* 179* 171* 183*   Microbiology Recent Results (from the past 240 hour(s))  URINE CULTURE     Status: None   Collection Time    11/28/12 10:40 AM      Result Value Range Status   Specimen Description URINE, CATHETERIZED   Final   Special Requests NONE   Final   Culture  Setup Time     Final   Value: 11/28/2012 15:47     Performed at [facility] Count     Final   Value: NO GROWTH     Performed at [facility]   Culture     Final   Value: NO GROWTH     Performed at [facility]   Report Status 11/29/2012 FINAL   Final  CULTURE, BLOOD (ROUTINE X 2)     Status: None   Collection Time    11/28/12 11:00 AM      Result Value Range Status   Specimen Description BLOOD LEFT ARM   Final   Special Requests BOTTLES DRAWN AEROBIC AND ANAEROBIC 6 CC EA   Final   Culture  Setup Time     Final   Value: 11/28/2012 19:51     Performed at [facility]   Culture     Final    Value: NO GROWTH 5 DAYS     Performed at [facility]   Report Status 12/04/2012 FINAL   Final  CULTURE, BLOOD (ROUTINE X 2)     Status: None   Collection Time    11/28/12 11:11 AM      Result Value Range Status   Specimen Description BLOOD LEFT ARM   Final   Special Requests BOTTLES DRAWN AEROBIC AND ANAEROBIC 6 CC EA   Final   Culture  Setup Time     Final   Value: 11/28/2012 19:51     Performed at [facility]   Culture     Final   Value: NO GROWTH 5 DAYS     Performed at [facility]   Report Status 12/04/2012 FINAL   Final  CULTURE, RESPIRATORY (NON-EXPECTORATED)     Status: None   Collection Time    11/28/12 11:35 AM      Result Value Range Status   Specimen Description TRACHEAL ASPIRATE   Final   Special Requests NONE   Final   Gram Stain     Final   Value: FEW WBC PRESENT, PREDOMINANTLY PMN     RARE SQUAMOUS EPITHELIAL CELLS PRESENT     RARE GRAM POSITIVE COCCI IN PAIRS     Performed at [facility]   Culture     Final   Value: FEW CANDIDA ALBICANS     Performed at [facility]   Report Status 12/01/2012 FINAL   Final  CLOSTRIDIUM DIFFICILE BY PCR     Status: None   Collection Time    12/01/12 11:26 PM      Result Value Range Status   C difficile by pcr NEGATIVE  NEGATIVE Final   Comment: Performed at [facility]  CULTURE, BLOOD (ROUTINE X 2)     Status: None   Collection Time    12/06/12  6:25 AM      Result Value Range Status   Specimen Description BLOOD LEFT ARM   Final   Special Requests BOTTLES DRAWN AEROBIC AND ANAEROBIC 5CC   Final   Culture  Setup Time     Final   Value: 12/06/2012 11:38     Performed at [facility]   Culture     Final   Value:        BLOOD CULTURE RECEIVED NO GROWTH TO DATE CULTURE WILL BE HELD FOR 5 DAYS BEFORE ISSUING A FINAL NEGATIVE REPORT     Performed at [facility]   Report Status PENDING   Incomplete  CULTURE, BLOOD (ROUTINE X 2)     Status: None    Collection Time    12/06/12  6:30 AM      Result Value Range Status   Specimen Description BLOOD RIGHT ANTECUBITAL   Final   Special Requests BOTTLES DRAWN AEROBIC AND ANAEROBIC 5CC   Final   Culture  Setup Time     Final   Value: 12/06/2012 11:37     Performed at [facility]   Culture     Final   Value: GRAM POSITIVE COCCI IN PAIRS     Note: Gram Stain Report Called to,Read Back By and Verified With: KAITLYN T@[id number]  ON [id number] BY [facility]     Performed at [facility]   Report Status PENDING   Incomplete  URINE CULTURE     Status: None   Collection Time    12/06/12  6:30 AM      Result Value Range Status   Specimen Description URINE, CATHETERIZED   Final   Special Requests NONE   Final   Culture  Setup Time     Final   Value: 12/06/2012 15:02     Performed at [facility] Count     Final   Value: >=100,000 COLONIES/ML     Performed at [facility]   Culture     Final   Value: YEAST     Performed at [facility]   Report Status 12/07/2012 FINAL   Final    Time coordinating discharge: 45 minutes.  Signed:  Javell Blackburn  Pager [phone] [facility] 12/07/2012, 2:45 PM

## 2012-12-07 NOTE — Care Management Note (Signed)
Cm received phone call from [place] in admitting at [facility]. Pt's accepting physician at [facility] is Dr. Nelson Chimes. Pt's assigned room number is #219. Rn to call report to [phone]. [facility] has received dc summary. Rn to contact [facility] to arrange transport.   Roxy Manns Monifah Freehling,RN,MSN [phone]

## 2012-12-07 NOTE — Progress Notes (Signed)
CSW continuing to follow.   Per MD, recommendation is for pt to be evaluated by LTAC as pt wife continues to pursue aggressive medical treatment and pt has Panda Tube for feeding.  RNCM aware.  CSW to continue to follow to assist as appropriate.   Jacklynn Lewis, MSW, LCSWA  Clinical Social Work [phone]

## 2012-12-07 NOTE — Progress Notes (Signed)
CRITICAL VALUE ALERT  Critical value received:  CO2 - 40  Date of notification:  12/07/12  Time of notification:  0605  Critical value read back:yes  Nurse who received alert:  S. Roney Marion, RN  MD notified (1st page):  Merdis Delay, NP  Time of first page:  0612  MD notified (2nd page):  Time of second page:  Responding MD: No response, no new orders given.  Will continue to monitor.

## 2012-12-07 NOTE — Progress Notes (Signed)
Progress Note from the Palliative Medicine Team at [facility]  Subjective: patient is minimally responsive to verbal stimuli,  Wife at bedsdie  -patients wife tell me decsion for transfer to LTAC is made and she feels very comfortable with her decsion (this has been very difficult for he)  -she tells me also of her decision for a do not intubate    Objective: Allergies  Allergen Reactions  . Hydromorphone Hcl Other (See Comments)    Dilaudid caused Confusion   . Morphine Other (See Comments)    confusion  . Pregabalin Other (See Comments)    lyrica - caused hallucinations   Scheduled Meds: . acetylcysteine  4 mL Nebulization QID  . albuterol  2.5 mg Nebulization QID  . antiseptic oral rinse  15 mL Mouth Rinse q12n4p  . chlordiazePOXIDE  5 mg Oral TID  . chlorhexidine  15 mL Mouth Rinse BID  . DULoxetine  30 mg Oral QHS  . famotidine  20 mg Oral QHS  . free water  250 mL Per Tube Q4H  . guaifenesin  200 mg Per NG tube Q4H  . insulin aspart  0-20 Units Subcutaneous Q4H  . insulin glargine  50 Units Subcutaneous QHS  . irbesartan  150 mg Oral Daily  . isosorbide dinitrate  15 mg Per Tube BID  . multivitamin  5 mL Per Tube Daily  . nystatin cream   Topical BID  . piperacillin-tazobactam (ZOSYN)  IV  3.375 g Intravenous Q8H  . potassium chloride  20 mEq Per Tube BID  . senna-docusate  1 tablet Oral BID  . vancomycin  1,250 mg Intravenous Q12H  . warfarin  7.5 mg Per Tube ONCE-1800  . Warfarin - Pharmacist Dosing Inpatient   Does not apply q1800   Continuous Infusions: . sodium chloride 20 mL/hr (12/04/12 2218)  . feeding supplement (JEVITY 1.2 CAL) 1,000 mL (12/07/12 0219)   PRN Meds:.acetaminophen (TYLENOL) oral liquid 160 mg/5 mL, albuterol, hydrALAZINE, hyoscyamine, liver oil-zinc oxide  BP 143/85  Pulse 104  Temp(Src) 99.2 F (37.3 C) (Axillary)  Resp 20  Ht 5\' 8"  (1.727 m)  Wt 104 kg (229 lb 4.5 oz)  BMI 34.87 kg/m2  SpO2 92%   PPS:20 %  Pain Score: unable  to communicate needs   Intake/Output Summary (Last 24 hours) at 12/07/12 1326 Last data filed at 12/07/12 1310  Gross per 24 hour  Intake 3996.38 ml  Output   1200 ml  Net 2796.38 ml       Physical Exam:  General: ill appearing, minimally responsive to verbal stimuli  HEENT: Mm, no exudate, audible throat secretions  Chest: diminished in bases L>R, scattered coarse BS  CVS: RRR  Abdomen:soft NT +BS  Ext: BLE +1 edema  Neuro: opens eyes to name, unable to communicate needs, unable to follow commands      Labs: CBC    Component Value Date/Time   WBC 13.3* 12/07/2012 0525   RBC 3.41* 12/07/2012 0525   HGB 10.1* 12/07/2012 0525   HCT 33.9* 12/07/2012 0525   PLT 322 12/07/2012 0525   MCV 99.4 12/07/2012 0525   MCH 29.6 12/07/2012 0525   MCHC 29.8* 12/07/2012 0525   RDW 15.5 12/07/2012 0525   LYMPHSABS 1.3 12/04/2012 0515   MONOABS 1.2* 12/04/2012 0515   EOSABS 0.2 12/04/2012 0515   BASOSABS 0.1 12/04/2012 0515    BMET    Component Value Date/Time   NA 146* 12/07/2012 0525   K 4.0 12/07/2012 0525  CL 104 12/07/2012 0525   CO2 40* 12/07/2012 0525   GLUCOSE 235* 12/07/2012 0525   BUN 19 12/07/2012 0525   CREATININE 0.83 12/07/2012 0525   CALCIUM 9.4 12/07/2012 0525   GFRNONAA 80* 12/07/2012 0525   GFRAA >90 12/07/2012 0525    CMP     Component Value Date/Time   NA 146* 12/07/2012 0525   K 4.0 12/07/2012 0525   CL 104 12/07/2012 0525   CO2 40* 12/07/2012 0525   GLUCOSE 235* 12/07/2012 0525   BUN 19 12/07/2012 0525   CREATININE 0.83 12/07/2012 0525   CALCIUM 9.4 12/07/2012 0525   PROT 7.6 11/15/2012 1926   ALBUMIN 3.4* 11/15/2012 1926   AST 23 11/15/2012 1926   ALT 12 11/15/2012 1926   ALKPHOS 94 11/15/2012 1926   BILITOT 0.3 11/15/2012 1926   GFRNONAA 80* 12/07/2012 0525   GFRAA >90 12/07/2012 0525       Assessment and Plan: 1. Code Status: DNR/DNI 2. Symptom Control:        1. Dysphagia: NPO, family considering PEG            Adult failure to thrive/weakness/altered mental status  At this time family is  hopeful for improvement. Continue with current treatment plan. Leaning toward PEG placement.      3. Psycho/Social:  Emotional support offered to wife at bedside.  Holistic support and education regarding the importance of re-evaluation of medical situation considering comfort, quality and dignity.  4. Disposition:  LTAC  Patient Documents Completed or Given: Document Given Completed  Advanced Directives Pkt    MOST    DNR    Gone from [facility]    Hard Choices yes      Lorinda Creed NP  Palliative Medicine Team Team Phone # [phone] Pager [phone]   1

## 2012-12-07 NOTE — Progress Notes (Addendum)
[facility] PROGRESS NOTE  Daved Mcfann ZOX:[id number] DOB: Apr 12, 1929 DOA: 11/25/2012 PCP: Georgianne Fick, MD  Brief narrative: Frederick [name] is an 79 y.o. male with a PMH of DM, hypertension, hyperlipidemia, coronary artery disease, atrial fibrillation, peripheral vascular disease, recent hospitalization for treatment of pseudomonal UTI who was admitted on 11/26/2012 with cough and chest x-ray findings of pulmonary edema a left-sided pleural effusion concerning for decompensated heart failure. On 11/28/2012, the patient's mental status became significantly altered with somnolence, and he was found to be hypercarbic and hypoxic with worsening respiratory failure, ultimately requiring intubation. He was successfully extubated on 12/02/2012. On 12/03/2012, a speech therapy evaluation was performed and he was found to have significant dysphasia necessitating placement of a feeding tube for tube feeds.  Because of failure to thrive and progressive decline, a palliative care consult was requested on 12/05/12.  Family meeting to be held 12/06/12 is no decisions made towards the descalation of care.  Patient spiked a temperature overnight 12/05/2012-12/06/2012, and now has a collapsed left lung. Broad-spectrum antibiotics resumed 12/06/2012.  May need a LTAC given wife's request to continue aggressive care.  Assessment/Plan: Principal Problem:   Acute respiratory failure with hypoxia -Multifactorial with aspiration pneumonia and CHF contributory. -Intubated 11/28/2012-12/02/2012. -Continue pulmonary toilet and increase mobilization, respiratory status tenuous. -CXR shows some improvement today with improved aeration of left lung. Active Problems:   Atelectasis / left lung collapse -Noted on CXR 12/06/12. Followup x-ray today shows some improvement in aeration. -Continue percussion per RT, Mucomyst/albuterol nebulizer treatments every 12 hours and guaifenesin every 4 hours via tube. -Can consider BiPAP  however with dysphasia and tube feeding, the patient is at high risk for further problems with aspiration and this may pose more harm than benefit.   Fever -Temperature spike to 101.5 noted 12/06/2012. Portable chest x-ray, urinalysis and culture as well as blood cultures ordered by cross covering physician. Portable chest x-ray showed complete collapse of the left lung. -Preliminary blood culture growing GPC in pairs, urine culture growing > 100,000 colonies of yeast.  Will add diflucan (Watch INR closely as diflucan can increase INR) -Broad-spectrum antibiotics resumed after goals of care discussion held with the patient's wife.   Hyponatremia -Increase free water to 250 cc per tube every 4 hours.   DIABETES MELLITUS, TYPE II, ON INSULIN -Currently on SSI insulin resistant scale every 4 hours and Lantus to 40 units daily. -CBGs 179-258.  Increase Lantus to 50 units daily (was on 46 units at home).   HYPERTENSION -Continue hydralazine as needed. -Continue Isordil, Avapro.  Given a dose of Lasix 12/05/12.   History of ESOPHAGEAL STRICTURE, dysphagia -Failed swallowing evaluation on 12/03/2012. -Will need GI evaluation to determine if esophageal stricture playing a role in his current dysphasia and if so, if there is a recommended treatment.  Discussed with Dr. Loreta Ave.  Not stable enough for EGD at present, and with relative stability of his stricture (not needed esophageal dilatation in several years), Dr. Loreta Ave does not feel this is the likely etiology of his stricture. -Panda tube placed with initiation of tube feeding per dietitian recommendations.  Tolerating well. -Will need ongoing discussion with family regarding a more permanent solution to his dysphasia/n.p.o. status.   GERD -Continue Pepcid.   Atrial fibrillation, permanent -Rate controlled, on chronic Coumadin.   Coronary artery disease, last cath 2008 occluded nondom, LCX, wth moderate ostial RCA disease, normal LV function -Continue  Isordil and Coumadin.   Diastolic CHF -Status post cardiology consultation done on 11/26/2012. Recommended a  more aggressive diuretic regimen upon discharge. -Given a dose of Lasix on 12/05/2012 with subsequent 1.1 L diuresis. I and O. balance up again today, will repeat Lasix. -Two-dimensional echocardiogram ordered. Followup results. ARB resumed.   Acute renal failure -Resolved.   Hypokalemia -Monitor and replace electrolytes as needed.   Septic shock secondary to Aspiration pneumonia -Completed 4 days of therapy with vancomycin and 5 days of Zosyn. -Blood and urine cultures negative. Tracheal aspirate grew a few colonies of yeast. -Broad-spectrum antibiotics resumed 12/06/2012 secondary to fever.   Delirium/toxic encephalopathy/Altered mental status -No history of advanced dementia, as initially reported. -Likely toxic encephalopathy from acute illness and hypoxia/hypercarbia. -Still having periods of confusion per wife, with sundowning. -Continues to require intermittent safety mittens.  Code Status: Partial Family Communication: Wife updated at bedside. Disposition Plan: SNF.   Medical Consultants:  Dr. Kalman Shan, Pulmonology.  Dr. Nanetta Batty, Cardiology  Palliative care  Other Consultants:  Diabetes coordinator  Dietician  Anti-infectives:  Vanc 8/24>>>8/27 (rising creat), resumed  12/06/2012  Zosyn 8/24>>>8/28, resumed 12/06/2012  Diflucan 12/07/12--->   HPI/Subjective: Frederick [name] awakens to voice but remains very lethargic. Has a weak cough with inability to mobilize secretions. Does complain of some dyspnea. Continues to be intermittently confused.  Objective: Filed Vitals:   12/06/12 2100 12/07/12 0545 12/07/12 0751 12/07/12 0827  BP: 141/84 143/85    Pulse: 100 104    Temp: 99.1 F (37.3 C) 99.2 F (37.3 C)    TempSrc: Axillary Axillary    Resp: 20 20    Height:      Weight:  101.6 kg (223 lb 15.8 oz)  104 kg (229 lb 4.5 oz)  SpO2: 97%  98% 92%     Intake/Output Summary (Last 24 hours) at 12/07/12 1421 Last data filed at 12/07/12 1310  Gross per 24 hour  Intake 3996.38 ml  Output   1200 ml  Net 2796.38 ml    Exam: Gen:  Awake, alert Cardiovascular:  Heart sounds are irregular  Respiratory:  Lungs diminished on the left with scattered rhonchi and increased upper airway congestion Gastrointestinal:  Abdomen soft, NT/ND, + BS Extremities:  1+ edema  Data Reviewed: Basic Metabolic Panel:  Recent Labs Lab 12/01/12 0437 12/02/12 0330  12/03/12 0400 12/03/12 2328 12/04/12 0515 12/06/12 0545 12/07/12 0525  NA 143  --   < > 147* 143 146* 147* 146*  K 3.8  --   < > 3.8 4.1 3.9 4.0 4.0  CL 102  --   < > 112 108 109 106 104  CO2 30  --   < > 30 31 30  37* 40*  GLUCOSE 121*  --   < > 157* 221* 300* 254* 235*  BUN 62*  --   < > 21 19 20 18 19   CREATININE 2.02*  --   < > 0.89 0.84 0.83 0.82 0.83  CALCIUM 9.7  --   < > 9.4 9.1 9.2 9.4 9.4  MG 2.1 2.0  --   --   --  1.9  --   --   PHOS 3.6 2.4  --   --   --  2.2*  --   --   < > = values in this interval not displayed. GFR Estimated Creatinine Clearance: 81.5 ml/min (by C-G formula based on Cr of 0.83).  Coagulation profile  Recent Labs Lab 12/03/12 0400 12/04/12 0515 12/05/12 0630 12/06/12 0545 12/07/12 0525  INR 2.21* 2.68* 1.92* 1.37 1.28    CBC:  Recent Labs Lab 12/01/12 0437 12/03/12 0400 12/04/12 0515 12/06/12 0545 12/07/12 0525  WBC 12.6* 13.9* 13.3* 13.5* 13.3*  NEUTROABS  --  9.5* 10.6*  --   --   HGB 10.2* 9.8* 10.1* 10.4* 10.1*  HCT 32.0* 31.8* 33.5* 34.5* 33.9*  MCV 94.1 98.1 98.5 99.4 99.4  PLT 256 340 342 359 322   Cardiac Enzymes:  Recent Labs Lab 12/01/12 1200 12/02/12 0330  CKTOTAL 44 39  CKMB 3.6  --    BNP (last 3 results)  Recent Labs  11/27/12 0538 12/02/12 0330 12/04/12 0515  PROBNP 1393.0* 1778.0* 3470.0*   CBG:  Recent Labs Lab 12/06/12 2051 12/07/12 0006 12/07/12 0427 12/07/12 0814 12/07/12 1202   GLUCAP 244* 258* 179* 171* 183*   Microbiology Recent Results (from the past 240 hour(s))  URINE CULTURE     Status: None   Collection Time    11/28/12 10:40 AM      Result Value Range Status   Specimen Description URINE, CATHETERIZED   Final   Special Requests NONE   Final   Culture  Setup Time     Final   Value: 11/28/2012 15:47     Performed at [facility] Count     Final   Value: NO GROWTH     Performed at [facility]   Culture     Final   Value: NO GROWTH     Performed at [facility]   Report Status 11/29/2012 FINAL   Final  CULTURE, BLOOD (ROUTINE X 2)     Status: None   Collection Time    11/28/12 11:00 AM      Result Value Range Status   Specimen Description BLOOD LEFT ARM   Final   Special Requests BOTTLES DRAWN AEROBIC AND ANAEROBIC 6 CC EA   Final   Culture  Setup Time     Final   Value: 11/28/2012 19:51     Performed at [facility]   Culture     Final   Value: NO GROWTH 5 DAYS     Performed at [facility]   Report Status 12/04/2012 FINAL   Final  CULTURE, BLOOD (ROUTINE X 2)     Status: None   Collection Time    11/28/12 11:11 AM      Result Value Range Status   Specimen Description BLOOD LEFT ARM   Final   Special Requests BOTTLES DRAWN AEROBIC AND ANAEROBIC 6 CC EA   Final   Culture  Setup Time     Final   Value: 11/28/2012 19:51     Performed at [facility]   Culture     Final   Value: NO GROWTH 5 DAYS     Performed at [facility]   Report Status 12/04/2012 FINAL   Final  CULTURE, RESPIRATORY (NON-EXPECTORATED)     Status: None   Collection Time    11/28/12 11:35 AM      Result Value Range Status   Specimen Description TRACHEAL ASPIRATE   Final   Special Requests NONE   Final   Gram Stain     Final   Value: FEW WBC PRESENT, PREDOMINANTLY PMN     RARE SQUAMOUS EPITHELIAL CELLS PRESENT     RARE GRAM POSITIVE COCCI IN PAIRS     Performed at [facility]   Culture      Final   Value: FEW CANDIDA ALBICANS  Performed at [facility]   Report Status 12/01/2012 FINAL   Final  CLOSTRIDIUM DIFFICILE BY PCR     Status: None   Collection Time    12/01/12 11:26 PM      Result Value Range Status   C difficile by pcr NEGATIVE  NEGATIVE Final   Comment: Performed at [facility]  CULTURE, BLOOD (ROUTINE X 2)     Status: None   Collection Time    12/06/12  6:25 AM      Result Value Range Status   Specimen Description BLOOD LEFT ARM   Final   Special Requests BOTTLES DRAWN AEROBIC AND ANAEROBIC 5CC   Final   Culture  Setup Time     Final   Value: 12/06/2012 11:38     Performed at [facility]   Culture     Final   Value:        BLOOD CULTURE RECEIVED NO GROWTH TO DATE CULTURE WILL BE HELD FOR 5 DAYS BEFORE ISSUING A FINAL NEGATIVE REPORT     Performed at [facility]   Report Status PENDING   Incomplete  CULTURE, BLOOD (ROUTINE X 2)     Status: None   Collection Time    12/06/12  6:30 AM      Result Value Range Status   Specimen Description BLOOD RIGHT ANTECUBITAL   Final   Special Requests BOTTLES DRAWN AEROBIC AND ANAEROBIC 5CC   Final   Culture  Setup Time     Final   Value: 12/06/2012 11:37     Performed at [facility]   Culture     Final   Value: GRAM POSITIVE COCCI IN PAIRS     Note: Gram Stain Report Called to,Read Back By and Verified With: KAITLYN [id number]  ON [id number] BY [facility]     Performed at [facility]   Report Status PENDING   Incomplete  URINE CULTURE     Status: None   Collection Time    12/06/12  6:30 AM      Result Value Range Status   Specimen Description URINE, CATHETERIZED   Final   Special Requests NONE   Final   Culture  Setup Time     Final   Value: 12/06/2012 15:02     Performed at [facility] Count     Final   Value: >=100,000 COLONIES/ML     Performed at [facility]   Culture     Final   Value: YEAST     Performed at [facility]    Report Status 12/07/2012 FINAL   Final     Procedures and Diagnostic Studies:  Dg Chest 2 View 11/25/2012   *RADIOLOGY REPORT*  Clinical Data: Cough and chest congestion.  CHEST - 2 VIEW  Comparison: 11/15/2012  Findings: Shallow inspiration.  Mild cardiac enlargement with pulmonary vascular congestion and central interstitial changes suggesting edema.  This is progressing since the previous study. There is superimposed infiltration or atelectasis in the left lung base.  Small left pleural effusion is not excluded.  No pneumothorax.  Calcified and tortuous aorta.  Degenerative changes in the spine and shoulders.  Surgical clips in the left upper quadrant.  Left ureteral stent.  IMPRESSION: Cardiac enlargement with developing pulmonary vascular congestion and perihilar edema.  Infiltration or atelectasis in the left lung base.  Possible left pleural effusion.   Original Report Authenticated By: Burman Nieves, M.D.  Dg Abd 1 View 12/03/2012   *RADIOLOGY REPORT*  Clinical Data: Panda placement confirmation.  ABDOMEN - 1 VIEW  Comparison: CT abdomen pelvis 11/15/2012  Findings: Enteric tube is seen with the tip overlying the expected location of the proximal stomach. Proximal and mid portions of a left double J ureteral stent are seen and appear properly positioned. Surgical clips are again noted in the left upper quadrant.  Nonobstructive bowel gas pattern. No acute osseous abnormality.  IMPRESSION: 1. Enteric tube tip overlying the stomach.  2. No acute findings.   Original Report Authenticated By: Jerene Dilling, M.D.    Dg Chest Port 1 View 12/02/2012   *RADIOLOGY REPORT*  Clinical Data: Endotracheal tube position.  PORTABLE CHEST - 1 VIEW  Comparison: December 01, 2012.  Findings: Endotracheal tube is in grossly good position with distal tip 2 cm above the carina.  Stable cardiomegaly is noted.  No change is noted in position of right internal jugular catheter. Mild central pulmonary vascular  congestion is again noted.  Left basilar opacity is again noted and unchanged concerning for atelectasis and possible associated pleural effusion.  IMPRESSION: Endotracheal tube in grossly good position.  Stable left basilar opacity.   Original Report Authenticated By: Lupita Raider.,  M.D.    Dg Chest Port 1 View 12/01/2012   *RADIOLOGY REPORT*  Clinical Data: Evaluate endotracheal tube  PORTABLE CHEST - 1 VIEW  Comparison: Prior chest x-ray 11/27/2012  Findings: The tip of the endotracheal tube is 1.7 cm above the carina.  Stable position of right IJ central venous catheter with the tip at the superior cavoatrial junction.  The tip of the nasogastric tube projects over the stomach. Slightly improved left lower lobe collapse.  Persistent bilateral layering effusions and associated bibasilar opacities.  Multiple surgical clips project over the left upper quadrant and mid epigastric region. Incompletely imaged left double-J ureteral stent.  Stable cardiomegaly and aortic atherosclerosis.  IMPRESSION:  1.  Slightly improved left lower lobe atelectasis/collapse. 2.  Persistent left greater than right layering pleural effusions and associated bibasilar atelectasis versus infiltrate 3.  Stable support apparatus.  The tip of the endotracheal tube is 1.7 cm above the carina.   Original Report Authenticated By: Malachy Moan, M.D.    Dg Chest Port 1 View 11/30/2012   *RADIOLOGY REPORT*  Clinical Data: Atelectasis, endotracheal tube placement.  PORTABLE CHEST - 1 VIEW  Comparison: 11/29/2012.  Findings: Endotracheal tube terminates approximately 1.7 cm above the carina.  Nasogastric tube is followed into the stomach.  Right IJ central line tip projects over the SVC.  Heart size is grossly stable.  Left lower lobe collapse/consolidation and left pleural effusion persist.  Lungs are overall low in volume with mild right basilar air space disease as well.  Surgical clips are seen in the upper abdomen.  IMPRESSION:  1.   Persistent left lower lobe collapse/consolidation.  Follow-up to clearing is recommended. 2.  Mild bibasilar air space disease.   Original Report Authenticated By: Leanna Battles, M.D.    Dg Chest Port 1 View 11/29/2012   *RADIOLOGY REPORT*  Clinical Data: Respiratory failure.  PORTABLE CHEST - 1 VIEW  Comparison: 11/28/2012  Findings: Endotracheal tube remains with the tip approximately 2 cm above the carina.  Central line and nasogastric tube positioning are stable.  Lungs show relatively stable left lower lobe atelectasis.  No pulmonary edema or pleural effusions are identified.  Heart size is stable.  IMPRESSION: Stable left lower lobe atelectasis.   Original Report  Authenticated By: Irish Lack, M.D.    Dg Chest Port 1 View 11/28/2012   *RADIOLOGY REPORT*  Clinical Data: Respiratory distress.  Intubation, central venous catheter placement and nasogastric tube placement.  PORTABLE CHEST - 1 VIEW 11/28/2012 1029 hours:  Comparison: Portable chest x-ray earlier same day 0851 hours.  Findings: Endotracheal tube tip projects approximately 2 cm above carina.  Right jugular central venous catheter tip projects over the lower SVC.  No evidence of pneumothorax mediastinal hematoma. Nasogastric tube tip in the fundus the stomach.  Cardiac silhouette enlarged but stable.  Mild pulmonary venous hypertension, improved since earlier in the morning, without overt edema currently. Slight improved aeration in the left lower lobe, though moderate consolidation persist.  No new pulmonary parenchymal abnormalities.  IMPRESSION:  1.  Endotracheal tube tip approximately 2 cm above the carina. 2.  Right jugular central venous catheter tip in the lower SVC.  No acute complicating features. 3.  Nasogastric tube tip in the gastric fundus. 4.  Interval resolution of interstitial pulmonary edema since earlier same date.  Improved aeration in the left lower lobe, though moderate atelectasis and/or pneumonia persists.  No new  abnormalities.   Original Report Authenticated By: Hulan Saas, M.D.    Dg Chest Port 1v Same Day 11/28/2012   *RADIOLOGY REPORT*  Clinical Data: Lethargy.  Respiratory distress.  PORTABLE CHEST - 1 VIEW SAME DAY  Comparison: 11/25/2012  Findings: The study is somewhat limited by respiratory motion, low lung volumes and a semi-erect rotated positioning.  Left lung base opacity noted previously is stable most likely atelectasis although infiltrate is possible.  There is some central vascular congestion. Mild right perihilar airspace opacity is suggested which may reflect asymmetric edema.  Cardiac silhouette is mildly enlarged.  No pneumothorax is appreciated.  IMPRESSION: Allowing for differences in patient positioning and technique, findings are similar to the prior exam.  There may be mild pulmonary edema although this appearance is accentuated by low lung volumes and motion artifact.  Left lung base opacities most likely atelectasis.   Original Report Authenticated By: Amie Portland, M.D.    Dg Swallowing Func-speech Pathology 11/27/2012   Lacinda Axon, CCC-SLP     11/27/2012  6:07 PM Objective Swallowing Evaluation: Modified Barium Swallowing Study   Patient Details  Name: Frederick [name] MRN: [id number] Date of Birth: 04/02/1930  Today's Date: 11/27/2012 Time: 1700-1730 SLP Time Calculation (min): 30 min  Past Medical History:  Past Medical History  Diagnosis Date  . Diabetes mellitus   . Hypertension   . Hypercholesterolemia   . Esophageal stricture   . Pulmonary embolism 2008    chronic coumadin   . Coronary artery disease     DR. BERRY IS PT'S CARDIOLOGIST  . Atrial fibrillation     CHRONIC COUMADIN-pemanent atrial fib  . Anxiety     SINCE 1975   . Depression     SINCE 1975    . Shortness of breath     NOT SURE WHAT CAUSES SOB - BUT HE IS NOT ABLE TO BE ACTIVE -  AND IS SOB WITH ANY ACTIVITY - USES OXYGEN 2 L NASAL CANNULA -  ALL THE TIME -EXCEPT WHEN IN SHOWER OR CHANGING CLOTHES on home  02  .  Peripheral vascular disease     TOLD SOME SMALL AMOUNT OF BLOCKAGE IN LEG WHEN HEART CATH DONE  2008  . Bladder tumor     HAS HAD HEMATURIA OFF AND ON - HAS FOLEY CATH THAT WAS PLACED  JUNE 20TH, 2014  . GERD (gastroesophageal reflux disease)   . Edema     FEET AND LEGS MOST DAYS - SOMETIMES WEARS COMPRESSION HOSE  . Cancer     SKIN CANCERS  . Arthritis     S/P BILATERAL TOTAL KNEE REPLACEMENTS - BUT BOTH KNEES PAINFUL  AND JOINTS WORN OUT; BAD RIGHT HIP - BUT NOT A CANDIDATE FOR HIP  PREPLACMENT;  HAS SEVERE LOWER  BACK AND LEG PAIN - HAS SPINAL  STENOSIS AND BULGING DISC; CURVATURE OF UPPER SPINE - UNABLE TO  LAY HIS HEAD FLAT.  Marland Kitchen Pneumonia 2005  . Difficult intubation     DUE TO LIMITED NECK FLEXION AND SHORT NECK--ANESTHESIA RECORD  FROM 2007 VATS SURGERY AT [facility] OBTAINED AND ON PT'S CHART.  Marland Kitchen Problems with hearing     WEARS BILATERAL HEARING AIDS   Past Surgical History:  Past Surgical History  Procedure Laterality Date  . Shoulder surgery      bilateral  . Pancreas surgery  2001    TUMOR REMOVED FROM PANCREAS AND SPLEEN ALSO REMOVED  . Total knee arthroplasty      bilateral  . Lung surgery  2007    non-cancer  VIDEO ASSISTED THORCOTOMY TO REMOVE FLUID /  DECORTICATION. DR. Edwyna Shell  . Basil cell    . Cardiac catheterization  2008    totally occluded nondominant LCX wth mod. ostial RCA disease  and nl. EF  . Esophageal stretch    . Skin cancer removed from left side of head - required skin  graft from left leg    . Cartilage repair 198- left knee    . Joint replacement    . Transurethral resection of bladder tumor with gyrus  (turbt-gyrus) N/A 11/03/2012    Procedure: TRANSURETHRAL RESECTION OF BLADDER TUMOR WITH GYRUS  (TURBT-GYRUS);  Surgeon: Milford Cage, MD;  Location: [facility]  [facility];  Service: Urology;  Laterality: N/A;  . Cystoscopy/retrograde/ureteroscopy  11/03/2012    Procedure: CYSTOSCOPY/RETROGRADE/ LEFT URETEROSCOPY AND STENT  PLACEMENT;  Surgeon: Milford Cage, MD;  Location: [facility];   Service: Urology;;  . Theador Hawthorne N/A 11/03/2012    Procedure: CYSTOGRAM;  Surgeon: Milford Cage, MD;   Location: [facility];  Service: Urology;  Laterality: N/A;   HPI:  Jakevion Arney is a 79 y.o. male who was just discharged from  our service 6 days ago after an admission for a UTI with  pseudomonas.  Since discharge he has getting progressively short  of breath until today he developed cough, productive of a whitish  sputum.  He has had no fever, no chills, no sweating since Monday  of this week.  In the ED while his WBC is elevated at 17k this is  significantly down from the >30k it was at discharge 6 days ago.   At the time of discharge he had been taken off of his Zaroxylyn  and had not yet restarted this, he did restart his lasix on  Tuesday.  MBS indicated following results of BSE completed on  11/25/12.       Assessment / Plan / Recommendation Clinical Impression  Dysphagia Diagnosis: Moderate oral phase dysphagia;Severe  pharyngeal phase dysphagia;Severe cervical esophageal phase  dysphagia;Suspected primary esophageal dysphagia  MBS completed.  Patient with head down posture with difficulty   maintaining  upright positon.  Assist required to reposition  patient throughout study as view blocked by shoulders.   Evaluation indicates moderate oral dysphagia marked by weak  lingual manipulation  and reduced posterior propulsion.  Piecemeal  swallows with all consistencies.  Severe pharyngeal phase  dysphagia with suspected primary esophageal dysphagia.  Decreased  TBR resulting in residuals in vallecular space with all  consistencies.  Reduced pharyngeal peristalsis.  Diffuse residue  from vallecular space to pyriforms s/p swallow of nectar barium  by cup and spoon, puree and mechanical soft consistency.   Prominent CP segment with pooling in pyriforms with all  consistencies prior to swallow with moderate amount of  containment s/p swallow.  Backflow into pharynx during swallow of  thin liquid barium by  cup, straw and spoon with eventual  penetration s/p swallow from residuals.  Unable to confirm  aspiration with thins due to shoulders blocking view. Strategies  of multiple effortful swallows with puree consistency and nectar  thick liquids by straw  effective in clearing majority of  residuals but patient quickly fatigued. No backflow to pharynx  noted with puree and nectar thick barium.    Brief esophageal  sweep revealed significant amount of stasis with backflow to  cervical esophagus.  No radiologist present to confirm.  Patient  at high risk for aspiration with any consistency due to residuals  and risk of backflow of bolus into pharynx from cervical  esophagus.   MD given results of evaluation by treating SLP.  MD  confirmed to proceed with dysphagia 1 consistency and nectar  thick liquids in limited amounts with full supervision with  strict aspiration and reflux precautions.  Aspiration risk  remains even with modified diet.   Diagnostic treatment completed  following evaluation focusing on providing education to  caregivers on swallow strategies to maximize safety.   Patient  may benefit from GI consult to assess current esophageal  functioning.  ST to follow in acute care setting for diet  tolerance and POC.   Recommend repeat MBS following results of GI  consult.         Diet Recommendation Dysphagia 1 (Puree);Nectar-thick liquid   Liquid Administration via: Straw;Cup Medication Administration: Via alternative means Supervision: Patient able to self feed;Full supervision/cueing  for compensatory strategies Compensations: Slow rate;Small sips/bites;Multiple dry swallows  after each bite/sip;Clear throat intermittently;Hard cough after  swallow;Effortful swallow Postural Changes and/or Swallow Maneuvers: Seated upright 90  degrees;Upright 30-60 min after meal    Other  Recommendations Recommended Consults: Consider GI  evaluation Oral Care Recommendations: Oral care before and after PO Other  Recommendations: Order thickener from pharmacy;Prohibited  food (jello, ice cream, thin soups);Have oral suction  available;Clarify dietary restrictions   Follow Up Recommendations  Outpatient SLP    Frequency and Duration min 2x/week  2 weeks       SLP Swallow Goals Patient will utilize recommended strategies during swallow to  increase swallowing safety with: Total assistance   General Date of Onset: 11/26/12 HPI: Marton Malizia is a 79 y.o. male who was just discharged  from our service 6 days ago after an admission for a UTI with  pseudomonas.  Since discharge he has getting progressively short  of breath until today he developed cough, productive of a whitish  sputum.  He has had no fever, no chills, no sweating since Monday  of this week.  In the ED while his WBC is elevated at 17k this is  significantly down from the >30k it was at discharge 6 days ago.   At the time of discharge he had been taken off of his Zaroxylyn  and had not yet restarted this, he did restart  his lasix on  Tuesday. Type of Study: Modified Barium Swallowing Study Reason for Referral: Objectively evaluate swallowing function Previous Swallow Assessment: BSE 11/26/12 Diet Prior to this Study: NPO Temperature Spikes Noted: No Respiratory Status: Supplemental O2 delivered via (comment) History of Recent Intubation: No Behavior/Cognition: Alert;Cooperative;Hard of hearing;Requires  cueing Oral Cavity - Dentition: Missing dentition Oral Motor / Sensory Function: Impaired - see Bedside swallow  eval Self-Feeding Abilities: Able to feed self;Needs assist;Needs set  up Patient Positioning: Upright in chair Baseline Vocal Quality: Breathy;Hoarse;Low vocal intensity Volitional Cough: Strong Volitional Swallow: Able to elicit Pharyngeal Secretions: Not observed secondary MBS    Reason for Referral Objectively evaluate swallowing function   Oral Phase Oral Preparation/Oral Phase Oral Phase: Impaired Oral - Nectar Oral - Nectar Teaspoon: Weak lingual  manipulation;Lingual  pumping;Incomplete tongue to palate contact;Holding of  bolus;Reduced posterior propulsion;Lingual/palatal  residue;Piecemeal swallowing;Delayed oral transit Oral - Nectar Cup: Weak lingual manipulation;Lingual  pumping;Incomplete tongue to palate contact;Reduced posterior  propulsion;Holding of bolus;Lingual/palatal residue;Piecemeal  swallowing Oral - Nectar Straw: Weak lingual manipulation;Lingual  pumping;Incomplete tongue to palate contact;Reduced posterior  propulsion;Holding of bolus;Piecemeal swallowing;Lingual/palatal  residue;Delayed oral transit Oral - Thin Oral - Thin Teaspoon: Weak lingual manipulation;Lingual  pumping;Incomplete tongue to palate contact;Impaired  mastication;Lingual/palatal residue;Piecemeal swallowing;Holding  of bolus;Reduced posterior propulsion;Delayed oral transit Oral - Thin Cup: Weak lingual manipulation;Lingual  pumping;Incomplete tongue to palate contact;Reduced posterior  propulsion;Holding of bolus;Lingual/palatal residue;Piecemeal  swallowing;Delayed oral transit Oral - Thin Straw: Weak lingual manipulation;Lingual  pumping;Incomplete tongue to palate contact;Reduced posterior  propulsion;Holding of bolus;Piecemeal swallowing;Lingual/palatal  residue Oral - Solids Oral - Puree: Weak lingual manipulation;Lingual pumping;Holding  of bolus;Reduced posterior propulsion;Lingual/palatal  residue;Piecemeal swallowing;Delayed oral transit Oral - Mechanical Soft: Impaired mastication;Weak lingual  manipulation;Lingual pumping;Incomplete tongue to palate  contact;Piecemeal swallowing;Lingual/palatal residue;Delayed oral  transit   Pharyngeal Phase Pharyngeal Phase Pharyngeal Phase: Impaired Pharyngeal - Nectar Pharyngeal - Nectar Teaspoon: Premature spillage to  valleculae;Delayed swallow initiation;Premature spillage to  pyriform sinuses;Reduced pharyngeal peristalsis;Reduced anterior  laryngeal mobility;Reduced laryngeal elevation;Reduced  airway/laryngeal  closure;Reduced tongue base  retraction;Penetration/Aspiration during  swallow;Penetration/Aspiration after swallow;Pharyngeal residue -  pyriform sinuses;Pharyngeal residue - valleculae;Pharyngeal  residue - cp segment Penetration/Aspiration details (nectar teaspoon): Material enters  airway, CONTACTS cords then ejected out Pharyngeal - Nectar Cup: Premature spillage to  valleculae;Premature spillage to pyriform sinuses;Delayed swallow  initiation;Reduced pharyngeal peristalsis;Reduced anterior  laryngeal mobility;Reduced laryngeal elevation;Reduced  airway/laryngeal closure;Reduced tongue base  retraction;Pharyngeal residue - pyriform sinuses;Pharyngeal  residue - valleculae;Pharyngeal residue - cp segment Pharyngeal - Thin Pharyngeal - Thin Teaspoon: Delayed swallow initiation;Premature  spillage to valleculae;Premature spillage to pyriform  sinuses;Reduced pharyngeal peristalsis;Reduced anterior laryngeal  mobility;Reduced tongue base retraction;Reduced airway/laryngeal  closure;Penetration/Aspiration after swallow;Pharyngeal residue -  pyriform sinuses;Pharyngeal residue - valleculae;Pharyngeal  residue - cp segment Penetration/Aspiration details (thin teaspoon): Material enters  airway, passes BELOW cords and not ejected out despite cough  attempt by patient Pharyngeal - Thin Cup: Premature spillage to pyriform  sinuses;Delayed swallow initiation;Reduced pharyngeal  peristalsis;Reduced anterior laryngeal mobility;Reduced laryngeal  elevation;Reduced airway/laryngeal closure;Reduced tongue base  retraction;Penetration/Aspiration after swallow;Trace  aspiration;Pharyngeal residue - valleculae;Pharyngeal residue -  pyriform sinuses;Pharyngeal residue - posterior  pharnyx;Pharyngeal residue - cp segment Penetration/Aspiration details (thin cup): Material enters  airway, passes BELOW cords and not ejected out despite cough  attempt by patient Pharyngeal - Thin Straw: Premature spillage to pyriform  sinuses;Reduced  pharyngeal peristalsis;Reduced anterior laryngeal  mobility;Reduced laryngeal elevation;Reduced airway/laryngeal  closure;Reduced tongue base retraction;Penetration/Aspiration  after swallow;Pharyngeal residue - pyriform sinuses;Pharyngeal  residue - posterior pharnyx;Pharyngeal residue -  cp  segment;Pharyngeal residue - valleculae Penetration/Aspiration details (thin straw): Material enters  airway, passes BELOW cords and not ejected out despite cough  attempt by patient Pharyngeal - Solids Pharyngeal - Puree: Premature spillage to valleculae;Reduced  pharyngeal peristalsis;Reduced anterior laryngeal  mobility;Reduced tongue base retraction;Reduced airway/laryngeal  closure;Reduced laryngeal elevation;Pharyngeal residue - pyriform  sinuses;Pharyngeal residue - posterior pharnyx;Pharyngeal residue  - valleculae;Pharyngeal residue - cp segment Pharyngeal - Mechanical Soft: Premature spillage to  valleculae;Reduced pharyngeal peristalsis;Reduced anterior  laryngeal mobility;Reduced laryngeal elevation;Reduced  airway/laryngeal closure;Reduced tongue base  retraction;Penetration/Aspiration after swallow;Pharyngeal  residue - posterior pharnyx;Pharyngeal residue - cp  segment;Pharyngeal residue - pyriform sinuses;Pharyngeal residue  - valleculae Penetration/Aspiration details (mechanical soft): Material enters  airway, passes BELOW cords then ejected out  Cervical Esophageal Phase    GO    Cervical Esophageal Phase Cervical Esophageal Phase: Impaired Cervical Esophageal Phase - Nectar Nectar Cup: Reduced cricopharyngeal relaxation;Prominent  cricopharyngeal segment;Esophageal backflow into cervical  esophagus Nectar Straw: Prominent cricopharyngeal segment;Esophageal  backflow into cervical esophagus Cervical Esophageal Phase - Thin Thin Cup: Reduced cricopharyngeal relaxation;Prominent  cricopharyngeal segment;Esophageal backflow into cervical  esophagus;Esophageal backflow into the pharynx Cervical Esophageal Phase -  Solids Puree: Prominent cricopharyngeal segment;Esophageal backflow into  cervical esophagus;Reduced cricopharyngeal relaxation Mechanical Soft: Reduced cricopharyngeal relaxation;Prominent  cricopharyngeal segment        Moreen Fowler MS, CCC-SLP [phone] [facility] 11/27/2012, 5:25 PM     Dg Chest Port 1 View 12/06/2012   *RADIOLOGY REPORT*  Clinical Data: Difficulty breathing.  PORTABLE CHEST - 1 VIEW  Comparison: Chest x-ray 12/02/2012.  Findings: Compared to the prior study the patient has been extubated. There is a right-sided internal jugular central venous catheter with tip terminating in the superior cavoatrial junction. A feeding tube in the proximal stomach.  Compared to the prior examination, there is complete opacification of the left hemithorax.  There appears to be some leftward shift of cardiomediastinal structures, favoring the majority of this finding being related to atelectasis, although underlying airspace consolidation and/or pleural effusion is not excluded.  Lung volumes are low.  Crowding of the pulmonary vasculature, without frank pulmonary edema.  Cardiac silhouette is enlarged. Mediastinal contour is largely obscured.  Atherosclerosis in the thoracic aorta.  Multiple surgical clips project over the left upper quadrant of the abdomen.  What appears represent a left double-J ureteral stent is noted, but incompletely visualized.  IMPRESSION: 1.  Support apparatus, as above. 2.  Complete opacification of the left hemithorax, new compared to the prior study, favored to predominately reflect atelectasis.  These results were called by telephone on 12/06/2012 at 07:10 a.m. to nurse Victorino Dike (for Dr. Claiborne Billings), who verbally acknowledged these results.   Original Report Authenticated By: Trudie Reed, M.D.   Scheduled Meds: . acetylcysteine  4 mL Nebulization QID  . albuterol  2.5 mg Nebulization QID  . antiseptic oral rinse  15 mL Mouth Rinse q12n4p  . chlordiazePOXIDE  5 mg Oral TID  .  chlorhexidine  15 mL Mouth Rinse BID  . DULoxetine  30 mg Oral QHS  . famotidine  20 mg Oral QHS  . free water  250 mL Per Tube Q4H  . guaifenesin  200 mg Per NG tube Q4H  . insulin aspart  0-20 Units Subcutaneous Q4H  . insulin glargine  50 Units Subcutaneous QHS  . irbesartan  150 mg Oral Daily  . isosorbide dinitrate  15 mg Per Tube BID  . multivitamin  5 mL Per Tube Daily  . nystatin cream   Topical  BID  . piperacillin-tazobactam (ZOSYN)  IV  3.375 g Intravenous Q8H  . potassium chloride  20 mEq Per Tube BID  . senna-docusate  1 tablet Oral BID  . vancomycin  1,250 mg Intravenous Q12H  . warfarin  7.5 mg Per Tube ONCE-1800  . Warfarin - Pharmacist Dosing Inpatient   Does not apply q1800   Continuous Infusions: . sodium chloride 20 mL/hr (12/04/12 2218)  . feeding supplement (JEVITY 1.2 CAL) 1,000 mL (12/07/12 0219)    Time spent: 35 minutes with > 50% of time discussing current diagnostic test results, clinical impression and plan of care with the patient's wife.    LOS: 12 days   Jaaziel Peatross  [facility] Pager [phone].   *Please note that the hospitalists switch teams on Wednesdays. Please call the flow manager at [phone] if you are having difficulty reaching the hospitalist taking care of this patient as she can update you and provide the most up-to-date pager number of provider caring for the patient. If 8PM-8AM, please contact night-coverage at [email or web address], password [facility]  12/07/2012, 2:21 PM

## 2012-12-07 NOTE — Progress Notes (Signed)
CRITICAL VALUE ALERT  Critical value received:  Positive Blood Cultures (Gram Positive Cocci in Pairs)  Date of notification:  12/07/12  Time of notification:  0900  Critical value read back:yes  Nurse who received alert:  Farley Ly, RN   MD notified (1st page):  Rama   Time of first page:  0900      MD notified (2nd page):  Time of second page:  Responding MD:  Rama   Time MD responded:  0900

## 2012-12-07 NOTE — Progress Notes (Signed)
Ordered for placed for pt. To be discharged to [facility].  Report was called to [facility] and [facility] transportation was arranged.

## 2012-12-07 NOTE — Progress Notes (Signed)
Speech Language Pathology Dysphagia Treatment Patient Details Name: Frederick [name] MRN: [id number] DOB: 21-Feb-1930 Today's Date: 12/07/2012 Time: 1324-4010 SLP Time Calculation (min): 23 min  Assessment / Plan / Recommendation Clinical Impression  Follow up with pt, family regarding pt's dysphagia, care plan.  Pt has been sleeping most of the day per family and Water engineer.  Wet/Gurgly breathing quality noted - concerning for decreased airway protection of even secretions.  No po trials given due to lethargy and poor secretion management.    SLP spoke to wife and continued education re: importance of oral care due to suspected aspiration of secretions, encouraging pt to cough strongly to clear secretions if he is reflexively coughing and to clear from his mouth.    SLP demonstrated to family use of swab and flashlight to assure oral cavity clear per their request. Oral cavity was clear without standing moist or dried secretions.  Further educated to family that feeding tube would give pt nutrition but would not prevent aspiration.    Note plans for pt to move to LTAC, recommend follow up SLP for dysphagia treatment when pt can participate and has improved secretion manaegment to allow maximal swallowing potential for swallow.  Thanks      Diet Recommendation  Continue with Current Diet: NPO    SLP Plan     Pertinent Vitals/Pain Low grade fever, rhonchi,  pt using chest vest at times which helps to mobilize secretions per family   Swallowing Goals  SLP Swallowing Goals Goal #3: Pt will consume trials of puree with min assist without severe evidence of aspiration to determine readiness for MBS following GI referral.  Goal #4: Family will verbalize importance of oral care with mod independence for pulmonary health in this pt with severe dysphagia.    General Temperature Spikes Noted: Yes Respiratory Status: Supplemental O2 delivered via (comment) (4 liters) Behavior/Cognition:  Lethargic;Other (comment) (sleeping) Patient Positioning: Upright in bed  Oral Cavity - Oral Hygiene Does patient have any of the following "at risk" factors?: Other - dysphagia;Oxygen therapy - cannula, mask, simple oxygen devices Patient is HIGH RISK - Oral Care Protocol followed (see row info): Yes   Dysphagia Treatment Treatment focused on: Patient/family/caregiver education Family/Caregiver Educated: wife Patient observed directly with PO's: No Reason PO's not observed: Lethargic   GO     Chales Abrahams 12/07/2012, 5:38 PM

## 2012-12-07 NOTE — Consult Note (Signed)
I have reviewed and discussed the care of this patient in detail with the nurse practitioner including pertinent patient records, physical exam findings and data. I agree with details of this encounter.  

## 2012-12-07 NOTE — Progress Notes (Signed)
ANTICOAGULATION CONSULT NOTE - Follow Up Consult  Pharmacy Consult for warfarin Indication: afib, history of PE  Allergies  Allergen Reactions  . Hydromorphone Hcl Other (See Comments)    Dilaudid caused Confusion   . Morphine Other (See Comments)    confusion  . Pregabalin Other (See Comments)    lyrica - caused hallucinations    Patient Measurements: Height: 5\' 8"  (172.7 cm) Weight: 229 lb 4.5 oz (104 kg) IBW/kg (Calculated) : 68.4  Vital Signs: Temp: 99.2 F (37.3 C) (09/02 0545) Temp src: Axillary (09/02 0545) BP: 143/85 mmHg (09/02 0545) Pulse Rate: 104 (09/02 0545)  Labs:  Recent Labs  12/05/12 0630 12/06/12 0545 12/07/12 0525  HGB  --  10.4* 10.1*  HCT  --  34.5* 33.9*  PLT  --  359 322  LABPROT 21.4* 16.5* 15.7*  INR 1.92* 1.37 1.28  CREATININE  --  0.82 0.83    Estimated Creatinine Clearance: 81.5 ml/min (by C-G formula based on Cr of 0.83).   Medications:  Scheduled:  . acetylcysteine  4 mL Nebulization QID  . albuterol  2.5 mg Nebulization QID  . antiseptic oral rinse  15 mL Mouth Rinse q12n4p  . chlordiazePOXIDE  5 mg Oral TID  . chlorhexidine  15 mL Mouth Rinse BID  . DULoxetine  30 mg Oral QHS  . famotidine  20 mg Oral QHS  . free water  250 mL Per Tube Q4H  . guaifenesin  200 mg Per NG tube Q4H  . insulin aspart  0-20 Units Subcutaneous Q4H  . insulin glargine  50 Units Subcutaneous QHS  . irbesartan  150 mg Oral Daily  . isosorbide dinitrate  15 mg Per Tube BID  . multivitamin  5 mL Per Tube Daily  . nystatin cream   Topical BID  . piperacillin-tazobactam (ZOSYN)  IV  3.375 g Intravenous Q8H  . potassium chloride  20 mEq Per Tube BID  . senna-docusate  1 tablet Oral BID  . vancomycin  1,250 mg Intravenous Q12H  . Warfarin - Pharmacist Dosing Inpatient   Does not apply q1800    Assessment: 79 y/o M recently discharged from [facility] 8/16 for sepsis secondary to Pseudomonas UTI, presented 8/21 with progressive SOB, productive cough.  Patient with h/o afib and PE (2008), on chronic warfarin. Patient was reportedly taking warfarin 5mg  po daily with last dose 8/21. On ventilator since 8/24 with possible aspiration pneumonia, extubated 8/28.  TF reinitiated, currently at 75 ml/hr  INR down to 1.28 likely 2/2 holding dose on 8/28 and lower than home Coumadin dose on 8/29 and 8/30. CBC stable. No bleeding noted. No lovenox needed (with subtherapeutic INR) per Dr. Darnelle Catalan since treating afib and PE history is remote.  Goal of Therapy:  INR 2-3 Monitor platelets by anticoagulation protocol: Yes   Plan:  Boost with Coumadin 7.5mg  po x 1 tonight via tube.  Daily PT/INR.  Lillia Pauls, PharmD Clinical Pharmacist Pager: [phone] 12/07/2012 9:42 AM

## 2012-12-07 NOTE — Care Management Note (Signed)
Cm spoke with patient and pt's spouse at the bedside concerning discharge planning. Pt's family agree with pt's referral to an LTACH. Cm has spoke with Kathlene November from [facility] and [place] from [facility] concerning initiating referral.    Roland Earl [phone]

## 2012-12-08 LAB — CULTURE, BLOOD (ROUTINE X 2)

## 2012-12-13 LAB — CULTURE, BLOOD (ROUTINE X 2): Culture: NO GROWTH

## 2012-12-15 ENCOUNTER — Ambulatory Visit: Payer: Medicare Other | Admitting: Cardiovascular Disease

## 2013-04-05 ENCOUNTER — Encounter (HOSPITAL_COMMUNITY): Payer: Self-pay | Admitting: Emergency Medicine

## 2013-04-05 ENCOUNTER — Inpatient Hospital Stay (HOSPITAL_COMMUNITY)
Admission: EM | Admit: 2013-04-05 | Discharge: 2013-04-09 | DRG: 208 | Disposition: A | Discharge status: Other Acute Inpatient Hospital | Payer: Medicare Other | Attending: Pulmonary Disease | Admitting: Pulmonary Disease

## 2013-04-05 ENCOUNTER — Emergency Department (HOSPITAL_COMMUNITY): Payer: Medicare Other

## 2013-04-05 DIAGNOSIS — E872 Acidosis, unspecified: Secondary | ICD-10-CM | POA: Diagnosis present

## 2013-04-05 DIAGNOSIS — R7881 Bacteremia: Secondary | ICD-10-CM | POA: Diagnosis present

## 2013-04-05 DIAGNOSIS — R410 Disorientation, unspecified: Secondary | ICD-10-CM

## 2013-04-05 DIAGNOSIS — F329 Major depressive disorder, single episode, unspecified: Secondary | ICD-10-CM | POA: Diagnosis present

## 2013-04-05 DIAGNOSIS — Z96659 Presence of unspecified artificial knee joint: Secondary | ICD-10-CM

## 2013-04-05 DIAGNOSIS — N179 Acute kidney failure, unspecified: Secondary | ICD-10-CM

## 2013-04-05 DIAGNOSIS — R627 Adult failure to thrive: Secondary | ICD-10-CM

## 2013-04-05 DIAGNOSIS — R1310 Dysphagia, unspecified: Secondary | ICD-10-CM | POA: Diagnosis present

## 2013-04-05 DIAGNOSIS — A419 Sepsis, unspecified organism: Secondary | ICD-10-CM

## 2013-04-05 DIAGNOSIS — J9620 Acute and chronic respiratory failure, unspecified whether with hypoxia or hypercapnia: Secondary | ICD-10-CM | POA: Diagnosis present

## 2013-04-05 DIAGNOSIS — N390 Urinary tract infection, site not specified: Secondary | ICD-10-CM | POA: Diagnosis present

## 2013-04-05 DIAGNOSIS — R4182 Altered mental status, unspecified: Secondary | ICD-10-CM

## 2013-04-05 DIAGNOSIS — E7800 Pure hypercholesterolemia, unspecified: Secondary | ICD-10-CM | POA: Diagnosis present

## 2013-04-05 DIAGNOSIS — G92 Toxic encephalopathy: Secondary | ICD-10-CM

## 2013-04-05 DIAGNOSIS — I739 Peripheral vascular disease, unspecified: Secondary | ICD-10-CM | POA: Diagnosis present

## 2013-04-05 DIAGNOSIS — Z85828 Personal history of other malignant neoplasm of skin: Secondary | ICD-10-CM

## 2013-04-05 DIAGNOSIS — M129 Arthropathy, unspecified: Secondary | ICD-10-CM | POA: Diagnosis present

## 2013-04-05 DIAGNOSIS — Z66 Do not resuscitate: Secondary | ICD-10-CM | POA: Diagnosis present

## 2013-04-05 DIAGNOSIS — K5730 Diverticulosis of large intestine without perforation or abscess without bleeding: Secondary | ICD-10-CM

## 2013-04-05 DIAGNOSIS — E785 Hyperlipidemia, unspecified: Secondary | ICD-10-CM

## 2013-04-05 DIAGNOSIS — M1990 Unspecified osteoarthritis, unspecified site: Secondary | ICD-10-CM

## 2013-04-05 DIAGNOSIS — I2510 Atherosclerotic heart disease of native coronary artery without angina pectoris: Secondary | ICD-10-CM | POA: Diagnosis present

## 2013-04-05 DIAGNOSIS — I5030 Unspecified diastolic (congestive) heart failure: Secondary | ICD-10-CM | POA: Diagnosis present

## 2013-04-05 DIAGNOSIS — K219 Gastro-esophageal reflux disease without esophagitis: Secondary | ICD-10-CM | POA: Diagnosis present

## 2013-04-05 DIAGNOSIS — J189 Pneumonia, unspecified organism: Secondary | ICD-10-CM

## 2013-04-05 DIAGNOSIS — F411 Generalized anxiety disorder: Secondary | ICD-10-CM | POA: Diagnosis present

## 2013-04-05 DIAGNOSIS — J690 Pneumonitis due to inhalation of food and vomit: Secondary | ICD-10-CM

## 2013-04-05 DIAGNOSIS — C259 Malignant neoplasm of pancreas, unspecified: Secondary | ICD-10-CM

## 2013-04-05 DIAGNOSIS — Z993 Dependence on wheelchair: Secondary | ICD-10-CM

## 2013-04-05 DIAGNOSIS — E871 Hypo-osmolality and hyponatremia: Secondary | ICD-10-CM | POA: Diagnosis present

## 2013-04-05 DIAGNOSIS — R5381 Other malaise: Secondary | ICD-10-CM | POA: Diagnosis present

## 2013-04-05 DIAGNOSIS — J9610 Chronic respiratory failure, unspecified whether with hypoxia or hypercapnia: Secondary | ICD-10-CM

## 2013-04-05 DIAGNOSIS — K449 Diaphragmatic hernia without obstruction or gangrene: Secondary | ICD-10-CM

## 2013-04-05 DIAGNOSIS — I10 Essential (primary) hypertension: Secondary | ICD-10-CM | POA: Diagnosis present

## 2013-04-05 DIAGNOSIS — K2970 Gastritis, unspecified, without bleeding: Secondary | ICD-10-CM

## 2013-04-05 DIAGNOSIS — E46 Unspecified protein-calorie malnutrition: Secondary | ICD-10-CM | POA: Diagnosis present

## 2013-04-05 DIAGNOSIS — D126 Benign neoplasm of colon, unspecified: Secondary | ICD-10-CM

## 2013-04-05 DIAGNOSIS — E119 Type 2 diabetes mellitus without complications: Secondary | ICD-10-CM

## 2013-04-05 DIAGNOSIS — C649 Malignant neoplasm of unspecified kidney, except renal pelvis: Secondary | ICD-10-CM

## 2013-04-05 DIAGNOSIS — I2699 Other pulmonary embolism without acute cor pulmonale: Secondary | ICD-10-CM

## 2013-04-05 DIAGNOSIS — J9811 Atelectasis: Secondary | ICD-10-CM

## 2013-04-05 DIAGNOSIS — Z794 Long term (current) use of insulin: Secondary | ICD-10-CM

## 2013-04-05 DIAGNOSIS — Z7901 Long term (current) use of anticoagulants: Secondary | ICD-10-CM

## 2013-04-05 DIAGNOSIS — I4891 Unspecified atrial fibrillation: Secondary | ICD-10-CM | POA: Diagnosis present

## 2013-04-05 DIAGNOSIS — J811 Chronic pulmonary edema: Secondary | ICD-10-CM

## 2013-04-05 DIAGNOSIS — R531 Weakness: Secondary | ICD-10-CM

## 2013-04-05 DIAGNOSIS — I2789 Other specified pulmonary heart diseases: Secondary | ICD-10-CM | POA: Diagnosis present

## 2013-04-05 DIAGNOSIS — F3289 Other specified depressive episodes: Secondary | ICD-10-CM | POA: Diagnosis present

## 2013-04-05 DIAGNOSIS — J9601 Acute respiratory failure with hypoxia: Secondary | ICD-10-CM

## 2013-04-05 DIAGNOSIS — J9600 Acute respiratory failure, unspecified whether with hypoxia or hypercapnia: Secondary | ICD-10-CM

## 2013-04-05 DIAGNOSIS — Z9981 Dependence on supplemental oxygen: Secondary | ICD-10-CM

## 2013-04-05 DIAGNOSIS — J441 Chronic obstructive pulmonary disease with (acute) exacerbation: Principal | ICD-10-CM | POA: Diagnosis present

## 2013-04-05 DIAGNOSIS — Z515 Encounter for palliative care: Secondary | ICD-10-CM

## 2013-04-05 DIAGNOSIS — H9190 Unspecified hearing loss, unspecified ear: Secondary | ICD-10-CM | POA: Diagnosis present

## 2013-04-05 DIAGNOSIS — J90 Pleural effusion, not elsewhere classified: Secondary | ICD-10-CM

## 2013-04-05 DIAGNOSIS — C679 Malignant neoplasm of bladder, unspecified: Secondary | ICD-10-CM | POA: Diagnosis present

## 2013-04-05 DIAGNOSIS — D638 Anemia in other chronic diseases classified elsewhere: Secondary | ICD-10-CM | POA: Diagnosis present

## 2013-04-05 DIAGNOSIS — Z86711 Personal history of pulmonary embolism: Secondary | ICD-10-CM

## 2013-04-05 DIAGNOSIS — E876 Hypokalemia: Secondary | ICD-10-CM | POA: Diagnosis present

## 2013-04-05 DIAGNOSIS — F0390 Unspecified dementia without behavioral disturbance: Secondary | ICD-10-CM | POA: Diagnosis present

## 2013-04-05 DIAGNOSIS — A498 Other bacterial infections of unspecified site: Secondary | ICD-10-CM | POA: Diagnosis present

## 2013-04-05 DIAGNOSIS — K222 Esophageal obstruction: Secondary | ICD-10-CM | POA: Diagnosis present

## 2013-04-05 DIAGNOSIS — I509 Heart failure, unspecified: Secondary | ICD-10-CM | POA: Diagnosis present

## 2013-04-05 DIAGNOSIS — G9340 Encephalopathy, unspecified: Secondary | ICD-10-CM | POA: Diagnosis present

## 2013-04-05 LAB — CBC WITH DIFFERENTIAL/PLATELET
Basophils Absolute: 0 10*3/uL (ref 0.0–0.1)
Eosinophils Absolute: 0 10*3/uL (ref 0.0–0.7)
Lymphs Abs: 1.4 10*3/uL (ref 0.7–4.0)
MCH: 28.8 pg (ref 26.0–34.0)
MCHC: 30.1 g/dL (ref 30.0–36.0)
MCV: 95.9 fL (ref 78.0–100.0)
Monocytes Absolute: 1.6 10*3/uL — ABNORMAL HIGH (ref 0.1–1.0)
Neutro Abs: 17.5 10*3/uL — ABNORMAL HIGH (ref 1.7–7.7)
Platelets: 626 10*3/uL — ABNORMAL HIGH (ref 150–400)
RDW: 16.5 % — ABNORMAL HIGH (ref 11.5–15.5)

## 2013-04-05 LAB — GLUCOSE, CAPILLARY
Glucose-Capillary: 157 mg/dL — ABNORMAL HIGH (ref 70–99)
Glucose-Capillary: 144 mg/dL — ABNORMAL HIGH (ref 70–99)

## 2013-04-05 LAB — POCT I-STAT 3, ART BLOOD GAS (G3+)
Bicarbonate: 45.1 mEq/L — ABNORMAL HIGH (ref 20.0–24.0)
O2 Saturation: 100 %
Patient temperature: 96.4
TCO2: 48 mmol/L (ref 0–100)
pCO2 arterial: 76.5 mmHg (ref 35.0–45.0)
pH, Arterial: 7.373 (ref 7.350–7.450)

## 2013-04-05 LAB — COMPREHENSIVE METABOLIC PANEL
ALT: 11 U/L (ref 0–53)
CO2: >45 mEq/L (ref 19–32)
Calcium: 8.9 mg/dL (ref 8.4–10.5)
Creatinine, Ser: 0.96 mg/dL (ref 0.50–1.35)
GFR calc Af Amer: 88 mL/min — ABNORMAL LOW (ref >90)
GFR calc non Af Amer: 76 mL/min — ABNORMAL LOW (ref >90)
Glucose, Bld: 180 mg/dL — ABNORMAL HIGH (ref 70–99)
Sodium: 133 mEq/L — ABNORMAL LOW (ref 137–147)

## 2013-04-05 LAB — URINE MICROSCOPIC-ADD ON

## 2013-04-05 LAB — CG4 I-STAT (LACTIC ACID): Lactic Acid, Venous: 1 mmol/L (ref 0.5–2.2)

## 2013-04-05 LAB — PROTIME-INR
INR: 1.42 (ref 0.00–1.49)
Prothrombin Time: 17 seconds — ABNORMAL HIGH (ref 11.6–15.2)

## 2013-04-05 LAB — URINALYSIS, ROUTINE W REFLEX MICROSCOPIC
Bilirubin Urine: NEGATIVE
Glucose, UA: NEGATIVE mg/dL
Nitrite: POSITIVE — AB
Protein, ur: 30 mg/dL — AB
Specific Gravity, Urine: 1.014 (ref 1.005–1.030)
Urobilinogen, UA: 0.2 mg/dL (ref 0.0–1.0)

## 2013-04-05 LAB — POCT I-STAT TROPONIN I: Troponin i, poc: 0.05 ng/mL (ref 0.00–0.08)

## 2013-04-05 LAB — MRSA PCR SCREENING: MRSA by PCR: POSITIVE — AB

## 2013-04-05 MED ORDER — CHLORHEXIDINE GLUCONATE 0.12 % MT SOLN
15.0000 mL | Freq: Two times a day (BID) | OROMUCOSAL | Status: DC
  Administered 2013-04-05 – 2013-04-09 (×8): 15 mL via OROMUCOSAL
  Filled 2013-04-05 (×8): qty 15

## 2013-04-05 MED ORDER — PANTOPRAZOLE SODIUM 40 MG IV SOLR
40.0000 mg | INTRAVENOUS | Status: DC
  Administered 2013-04-06 – 2013-04-08 (×3): 40 mg via INTRAVENOUS
  Filled 2013-04-05 (×4): qty 40

## 2013-04-05 MED ORDER — SODIUM CHLORIDE 0.9 % IV SOLN
1000.0000 mL | Freq: Once | INTRAVENOUS | Status: AC
  Administered 2013-04-05: 1000 mL via INTRAVENOUS

## 2013-04-05 MED ORDER — SODIUM CHLORIDE 0.9 % IV BOLUS (SEPSIS)
1000.0000 mL | Freq: Once | INTRAVENOUS | Status: AC
  Administered 2013-04-05: 1000 mL via INTRAVENOUS

## 2013-04-05 MED ORDER — SODIUM CHLORIDE 0.9 % IV SOLN
50.0000 ug/h | INTRAVENOUS | Status: DC
  Administered 2013-04-05 – 2013-04-07 (×3): 50 ug/h via INTRAVENOUS
  Administered 2013-04-07 (×2): 100 ug/h via INTRAVENOUS
  Filled 2013-04-05 (×3): qty 50

## 2013-04-05 MED ORDER — IPRATROPIUM BROMIDE HFA 17 MCG/ACT IN AERS
6.0000 | INHALATION_SPRAY | RESPIRATORY_TRACT | Status: DC
  Administered 2013-04-05 – 2013-04-08 (×18): 6 via RESPIRATORY_TRACT
  Filled 2013-04-05 (×2): qty 12.9

## 2013-04-05 MED ORDER — ETOMIDATE 2 MG/ML IV SOLN
10.0000 mg | Freq: Once | INTRAVENOUS | Status: AC
  Administered 2013-04-05: 10 mg via INTRAVENOUS

## 2013-04-05 MED ORDER — ALBUTEROL SULFATE HFA 108 (90 BASE) MCG/ACT IN AERS: 6.0000 | INHALATION_SPRAY | RESPIRATORY_TRACT | Status: DC | PRN

## 2013-04-05 MED ORDER — SODIUM CHLORIDE 0.9 % IV SOLN
1000.0000 mL | INTRAVENOUS | Status: DC
  Administered 2013-04-05: 1000 mL via INTRAVENOUS

## 2013-04-05 MED ORDER — BIOTENE DRY MOUTH MT LIQD: 15.0000 mL | Freq: Four times a day (QID) | OROMUCOSAL | Status: DC

## 2013-04-05 MED ORDER — ALBUTEROL SULFATE HFA 108 (90 BASE) MCG/ACT IN AERS
6.0000 | INHALATION_SPRAY | RESPIRATORY_TRACT | Status: DC
  Administered 2013-04-05 – 2013-04-08 (×18): 6 via RESPIRATORY_TRACT
  Filled 2013-04-05 (×3): qty 6.7

## 2013-04-05 MED ORDER — MIDAZOLAM HCL 5 MG/ML IJ SOLN
2.0000 mg/h | INTRAMUSCULAR | Status: DC
  Administered 2013-04-05: 2 mg/h via INTRAVENOUS
  Administered 2013-04-06: 4 mg/h via INTRAVENOUS
  Filled 2013-04-05 (×2): qty 10

## 2013-04-05 MED ORDER — CHLORHEXIDINE GLUCONATE 0.12 % MT SOLN: 15.0000 mL | Freq: Two times a day (BID) | OROMUCOSAL | Status: DC

## 2013-04-05 MED ORDER — WARFARIN SODIUM 10 MG PO TABS
10.0000 mg | ORAL_TABLET | Freq: Once | ORAL | Status: AC
  Administered 2013-04-05: 10 mg
  Filled 2013-04-05: qty 1

## 2013-04-05 MED ORDER — INSULIN ASPART 100 UNIT/ML ~~LOC~~ SOLN
0.0000 [IU] | SUBCUTANEOUS | Status: DC
  Administered 2013-04-05: 2 [IU] via SUBCUTANEOUS
  Administered 2013-04-06 (×2): 3 [IU] via SUBCUTANEOUS
  Administered 2013-04-07: 2 [IU] via SUBCUTANEOUS
  Administered 2013-04-07: 3 [IU] via SUBCUTANEOUS
  Administered 2013-04-07: 2 [IU] via SUBCUTANEOUS
  Administered 2013-04-07: 5 [IU] via SUBCUTANEOUS
  Administered 2013-04-08: 2 [IU] via SUBCUTANEOUS
  Administered 2013-04-08 (×2): 5 [IU] via SUBCUTANEOUS
  Administered 2013-04-09: 3 [IU] via SUBCUTANEOUS
  Administered 2013-04-09 (×2): 2 [IU] via SUBCUTANEOUS

## 2013-04-05 MED ORDER — WARFARIN - PHARMACIST DOSING INPATIENT
Freq: Every day | Status: DC
  Administered 2013-04-05: 20:00:00

## 2013-04-05 MED ORDER — DEXTROSE 5 % IV SOLN
2.0000 g | Freq: Once | INTRAVENOUS | Status: AC
  Administered 2013-04-05: 2 g via INTRAVENOUS

## 2013-04-05 MED ORDER — VANCOMYCIN HCL IN DEXTROSE 1-5 GM/200ML-% IV SOLN
1000.0000 mg | Freq: Once | INTRAVENOUS | Status: AC
  Administered 2013-04-05: 1000 mg via INTRAVENOUS
  Filled 2013-04-05: qty 200

## 2013-04-05 MED ORDER — DEXTROSE 5 % IV SOLN
1.0000 g | Freq: Three times a day (TID) | INTRAVENOUS | Status: DC
  Administered 2013-04-05 – 2013-04-08 (×8): 1 g via INTRAVENOUS
  Filled 2013-04-05 (×11): qty 1

## 2013-04-05 MED ORDER — VANCOMYCIN HCL IN DEXTROSE 1-5 GM/200ML-% IV SOLN
1000.0000 mg | Freq: Three times a day (TID) | INTRAVENOUS | Status: DC
  Administered 2013-04-05 – 2013-04-06 (×2): 1000 mg via INTRAVENOUS
  Filled 2013-04-05 (×4): qty 200

## 2013-04-05 MED ORDER — METHYLPREDNISOLONE SODIUM SUCC 40 MG IJ SOLR
40.0000 mg | Freq: Two times a day (BID) | INTRAMUSCULAR | Status: DC
  Administered 2013-04-05 – 2013-04-06 (×3): 40 mg via INTRAVENOUS
  Filled 2013-04-05 (×4): qty 1

## 2013-04-05 MED ORDER — ROCURONIUM BROMIDE 50 MG/5ML IV SOLN
100.0000 mg | Freq: Once | INTRAVENOUS | Status: AC
  Administered 2013-04-05: 100 mg via INTRAVENOUS
  Filled 2013-04-05: qty 10

## 2013-04-05 MED ORDER — BIOTENE DRY MOUTH MT LIQD
1.0000 "application " | Freq: Four times a day (QID) | OROMUCOSAL | Status: DC
  Administered 2013-04-06 – 2013-04-09 (×16): 15 mL via OROMUCOSAL

## 2013-04-05 NOTE — ED Notes (Signed)
Patient has increasing sob today, Upon EMS arrival patient lethargic and unresponsive, RR at 6 per min. Patient assisted with bag valve mask and opa per EMS.  Patient intubated upon arrival,

## 2013-04-05 NOTE — Progress Notes (Signed)
Responded to page to provide emotional and spiritual support to patient wife and niece.  Patient came in by EMS to ED as respiratory distress and was later intubated.  Per patient wife patient was not communicating or responding normal. Family was escorted to consult room and later to bedside. Patient is critical and is receiving comfort care. Patient going to ICU and family would appreciate a follow up visit. Promoted information sharing between staff and family. Provided hospitality and ministry of presence. Will follow as needed.   04/05/13 1400  Clinical Encounter Type  Visited With Patient;Family;Health care provider  Visit Type Spiritual support;Social support;Critical Care;ED;Trauma  Referral From Nurse  Spiritual Encounters  Spiritual Needs Prayer;Emotional  Stress Factors  Family Stress Factors Major life changes  Tremane Spurgeon, [address]

## 2013-04-05 NOTE — ED Notes (Signed)
Sb/p  Down to 31 , spoke with Md ,order for another liter of fluid

## 2013-04-05 NOTE — ED Notes (Signed)
sats are now 100 % after prior interventions

## 2013-04-05 NOTE — ED Notes (Signed)
Results of lactic acid and troponin shown to Dr. Gwendolyn Grant

## 2013-04-05 NOTE — ED Notes (Signed)
Pt remains off of drips for sedation ,

## 2013-04-05 NOTE — ED Provider Notes (Signed)
CSN: [id number]     Arrival date & time 04/05/13  1135 History   First MD Initiated Contact with Patient 04/05/13 1145     Chief Complaint  Patient presents with  . Respiratory Distress   (Consider location/radiation/quality/duration/timing/severity/associated sxs/prior Treatment) HPI Comments: Patient's wife states he was recently taken off his CPAP about 2 weeks ago. He has had worsening shortness of breath and somnolence during the day. Patient was not responsive this morning, wife said he did eat oatmeal, will and and was later gurgling. EMS arrived at our respirations and difficulty breathing. They placed a nasal airway and were able to improve sats  in route with bag valve mask.  Patient is a 79 y.o. male presenting with shortness of breath. The history is provided by the EMS personnel and the spouse.  Shortness of Breath Onset quality:  Gradual Timing:  Constant Progression:  Worsening Chronicity:  New Context: not activity and not animal exposure   Context comment:  Decrease use of CPAP. Wife fed him a meal this morning and he was gurgling afterwards, concern for aspiration Relieved by:  Nothing Worsened by:  Nothing tried Associated symptoms: no abdominal pain and no fever     Past Medical History  Diagnosis Date  . Diabetes mellitus   . Hypertension   . Hypercholesterolemia   . Esophageal stricture   . Pulmonary embolism 2008    chronic coumadin   . Coronary artery disease     DR. BERRY IS PT'S CARDIOLOGIST  . Atrial fibrillation     CHRONIC COUMADIN-pemanent atrial fib  . Anxiety     SINCE 1975   . Depression     SINCE 1975    . Shortness of breath     NOT SURE WHAT CAUSES SOB - BUT HE IS NOT ABLE TO BE ACTIVE - AND IS SOB WITH ANY ACTIVITY - USES OXYGEN 2 L NASAL CANNULA - ALL THE TIME -EXCEPT WHEN IN SHOWER OR CHANGING CLOTHES on home 02  . Peripheral vascular disease     TOLD SOME SMALL AMOUNT OF BLOCKAGE IN LEG WHEN HEART CATH DONE 2008  . Bladder tumor      HAS HAD HEMATURIA OFF AND ON - HAS FOLEY CATH THAT WAS PLACED JUNE 20TH, 2014  . GERD (gastroesophageal reflux disease)   . Edema     FEET AND LEGS MOST DAYS - SOMETIMES WEARS COMPRESSION HOSE  . Cancer     SKIN CANCERS  . Arthritis     S/P BILATERAL TOTAL KNEE REPLACEMENTS - BUT BOTH KNEES PAINFUL AND JOINTS WORN OUT; BAD RIGHT HIP - BUT NOT A CANDIDATE FOR HIP PREPLACMENT;  HAS SEVERE LOWER  BACK AND LEG PAIN - HAS SPINAL STENOSIS AND BULGING DISC; CURVATURE OF UPPER SPINE - UNABLE TO LAY HIS HEAD FLAT.  Marland Kitchen Pneumonia 2005  . Difficult intubation     DUE TO LIMITED NECK FLEXION AND SHORT NECK--ANESTHESIA RECORD FROM 2007 VATS SURGERY AT [facility] OBTAINED AND ON PT'S CHART.  Marland Kitchen Problems with hearing     WEARS BILATERAL HEARING AIDS   Past Surgical History  Procedure Laterality Date  . Shoulder surgery      bilateral  . Pancreas surgery  2001    TUMOR REMOVED FROM PANCREAS AND SPLEEN ALSO REMOVED  . Total knee arthroplasty      bilateral  . Lung surgery  2007    non-cancer  VIDEO ASSISTED THORCOTOMY TO REMOVE FLUID / DECORTICATION. DR. Edwyna Shell  . Basil cell    .  Cardiac catheterization  2008    totally occluded nondominant LCX wth mod. ostial RCA disease and nl. EF  . Esophageal stretch    . Skin cancer removed from left side of head - required skin graft from left leg    . Cartilage repair 198- left knee    . Joint replacement    . Transurethral resection of bladder tumor with gyrus (turbt-gyrus) N/A 11/03/2012    Procedure: TRANSURETHRAL RESECTION OF BLADDER TUMOR WITH GYRUS (TURBT-GYRUS);  Surgeon: Milford Cage, MD;  Location: [facility];  Service: Urology;  Laterality: N/A;  . Cystoscopy/retrograde/ureteroscopy  11/03/2012    Procedure: CYSTOSCOPY/RETROGRADE/ LEFT URETEROSCOPY AND STENT PLACEMENT;  Surgeon: Milford Cage, MD;  Location: [facility];  Service: Urology;;  . Theador Hawthorne N/A 11/03/2012    Procedure: CYSTOGRAM;  Surgeon: Milford Cage, MD;  Location: [facility];   Service: Urology;  Laterality: N/A;   Family History  Problem Relation Age of Onset  . Heart disease Mother   . Melanoma Father    History  Substance Use Topics  . Smoking status: Never Smoker   . Smokeless tobacco: Never Used  . Alcohol Use: No    Review of Systems  Unable to perform ROS: Acuity of condition  Constitutional: Negative for fever.  Respiratory: Positive for shortness of breath.   Gastrointestinal: Negative for abdominal pain.    Allergies  Hydromorphone hcl; Morphine; and Pregabalin  Home Medications   Current Outpatient Rx  Name  Route  Sig  Dispense  Refill  . acetaminophen (TYLENOL) 160 MG/5ML solution   Per Tube   Place 20.3 mLs (650 mg total) into feeding tube every 6 (six) hours as needed.   120 mL   0   . acetylcysteine (MUCOMYST) 20 % nebulizer solution   Nebulization   Take 4 mLs by nebulization 4 (four) times daily.   30 mL   12   . albuterol (PROVENTIL) (5 MG/ML) 0.5% nebulizer solution   Nebulization   Take 0.5 mLs (2.5 mg total) by nebulization 4 (four) times daily.   20 mL   12   . albuterol (PROVENTIL) (5 MG/ML) 0.5% nebulizer solution   Nebulization   Take 0.5 mLs (2.5 mg total) by nebulization every 6 (six) hours as needed for wheezing or shortness of breath.   20 mL   12   . chlordiazePOXIDE (LIBRIUM) 5 MG capsule   Oral   Take 1 capsule (5 mg total) by mouth 3 (three) times daily.   30 capsule   0   . chlorhexidine (PERIDEX) 0.12 % solution   Mouth/Throat   Use as directed 15 mLs in the mouth or throat 2 (two) times daily.   120 mL   0   . DULoxetine (CYMBALTA) 30 MG capsule   Oral   Take 1 capsule (30 mg total) by mouth at bedtime.      3   . famotidine (PEPCID) 20 MG tablet   Oral   Take 1 tablet (20 mg total) by mouth at bedtime.         . fluconazole (DIFLUCAN) 2 mg/mL IVPB   Intravenous   Inject 100 mLs (200 mg total) into the vein daily.   100 mL      . guaifenesin (ROBITUSSIN) 100 MG/5ML syrup    Per Tube   Place 10 mLs (200 mg total) into feeding tube every 4 (four) hours.   120 mL   0   . hydrALAZINE (APRESOLINE) 20 MG/ML injection  Intravenous   Inject 0.5 mLs (10 mg total) into the vein every 4 (four) hours as needed (elevated BP).   1 mL      . hyoscyamine (LEVSIN, ANASPAZ) 0.125 MG tablet   Oral   Take 1 tablet (0.125 mg total) by mouth every 4 (four) hours as needed for cramping (bladder spasms).   40 tablet   4   . insulin aspart (NOVOLOG) 100 UNIT/ML injection   Subcutaneous   Inject 0-20 Units into the skin every 4 (four) hours.   1 vial   12   . insulin glargine (LANTUS) 100 UNIT/ML injection   Subcutaneous   Inject 0.5 mLs (50 Units total) into the skin daily. Daily with breakfast   10 mL   12   . irbesartan (AVAPRO) 150 MG tablet   Oral   Take 150 mg by mouth daily with breakfast. -HOLD UNTIL FOLLOW UP WITH PRIMARY CARE DOCTOR         . isosorbide dinitrate (ISORDIL) 5 MG tablet   Per Tube   Place 3 tablets (15 mg total) into feeding tube 2 (two) times daily.         Marland Kitchen liver oil-zinc oxide (DESITIN) 40 % ointment   Topical   Apply 1 application topically daily as needed for dry skin. Apply to bottom         . Multiple Vitamin (MULTIVITAMIN) LIQD   Per Tube   Place 5 mLs into feeding tube daily.         . Nutritional Supplements (FEEDING SUPPLEMENT, JEVITY 1.2 CAL,) LIQD   Per Tube   Place 1,000 mLs into feeding tube continuous.      0   . nystatin cream (MYCOSTATIN)   Topical   Apply topically 2 (two) times daily.   30 g   0   . piperacillin-tazobactam (ZOSYN) 3.375 GM/50ML IVPB   Intravenous   Inject 50 mLs (3.375 g total) into the vein every 8 (eight) hours.   50 mL      . senna-docusate (SENOKOT S) 8.6-50 MG per tablet   Oral   Take 1 tablet by mouth 2 (two) times daily.   60 tablet   0   . sodium chloride 0.9 % SOLN 250 mL with vancomycin 10 G SOLR 1,250 mg   Intravenous   Inject 1,250 mg into the vein every 12  (twelve) hours.         Marland Kitchen warfarin (COUMADIN) 5 MG tablet   Oral   Take 5 mg by mouth daily.          . Water For Irrigation, Sterile (FREE WATER) SOLN   Per Tube   Place 250 mLs into feeding tube every 4 (four) hours.          BP 119/75  Pulse 91  Temp(Src) 96.4 F (35.8 C) (Rectal)  Resp 12  Ht 5\' 9"  (1.753 m)  Wt 220 lb 7.4 oz (100 kg)  BMI 32.54 kg/m2  SpO2 99% Physical Exam  Nursing note and vitals reviewed. Constitutional: He is oriented to person, place, and time. He appears well-developed and well-nourished. He appears distressed.  HENT:  Head: Normocephalic and atraumatic.  Mouth/Throat: No oropharyngeal exudate.  Eyes: EOM are normal. Pupils are equal, round, and reactive to light.  Neck: Normal range of motion. Neck supple.  Cardiovascular: Normal rate and regular rhythm.  Exam reveals no friction rub.   No murmur heard. Pulmonary/Chest: He is in respiratory distress (agonal respirations). He has  decreased breath sounds. He has no wheezes. He has rhonchi (diffuse). He has no rales.  Abdominal: He exhibits no distension. There is no tenderness. There is no rebound.  Musculoskeletal: Normal range of motion. He exhibits no edema.  Neurological: He is alert and oriented to person, place, and time.  Skin: He is diaphoretic.    ED Course  INTUBATION Date/Time: 04/05/2013 2:32 PM Performed by: Dagmar Hait Authorized by: Dagmar Hait Consent: The procedure was performed in an emergent situation. Indications: respiratory distress and respiratory failure Intubation method: video-assisted Patient status: paralyzed (RSI) Preoxygenation: BVM Sedatives: etomidate Paralytic: rocuronium Laryngoscope size: Mac 4 Tube size: 7.5 mm Tube type: cuffed Number of attempts: 2 Ventilation between attempts: BVM Cricoid pressure: no Cords visualized: no ETT to lip: 23 cm   (including critical care time) Labs Review Labs Reviewed  CBC WITH  DIFFERENTIAL - Abnormal; Notable for the following:    WBC 20.5 (*)    RBC 3.40 (*)    Hemoglobin 9.8 (*)    HCT 32.6 (*)    RDW 16.5 (*)    Platelets 626 (*)    Neutrophils Relative % 85 (*)    Lymphocytes Relative 7 (*)    nRBC 6 (*)    Neutro Abs 17.5 (*)    Monocytes Absolute 1.6 (*)    All other components within normal limits  COMPREHENSIVE METABOLIC PANEL - Abnormal; Notable for the following:    Sodium 133 (*)    Chloride 82 (*)    CO2 >45 (*)    Glucose, Bld 180 (*)    Albumin 1.7 (*)    Alkaline Phosphatase 154 (*)    Total Bilirubin <0.2 (*)    GFR calc non Af Amer 76 (*)    GFR calc Af Amer 88 (*)    All other components within normal limits  URINALYSIS, ROUTINE W REFLEX MICROSCOPIC - Abnormal; Notable for the following:    APPearance CLOUDY (*)    Hgb urine dipstick LARGE (*)    Protein, ur 30 (*)    Nitrite POSITIVE (*)    Leukocytes, UA LARGE (*)    All other components within normal limits  PROTIME-INR - Abnormal; Notable for the following:    Prothrombin Time 17.0 (*)    All other components within normal limits  URINE MICROSCOPIC-ADD ON - Abnormal; Notable for the following:    Squamous Epithelial / LPF FEW (*)    Bacteria, UA MANY (*)    Casts HYALINE CASTS (*)    All other components within normal limits  GLUCOSE, CAPILLARY - Abnormal; Notable for the following:    Glucose-Capillary 157 (*)    All other components within normal limits  POCT I-STAT 3, BLOOD GAS (G3+) - Abnormal; Notable for the following:    pCO2 arterial 76.5 (*)    pO2, Arterial 261.0 (*)    Bicarbonate 45.1 (*)    Acid-Base Excess 16.0 (*)    All other components within normal limits  CULTURE, BLOOD (ROUTINE X 2)  CULTURE, BLOOD (ROUTINE X 2)  URINE CULTURE  CULTURE, RESPIRATORY (NON-EXPECTORATED)  BLOOD GAS, ARTERIAL  TROPONIN I  TROPONIN I  TROPONIN I  LACTIC ACID, PLASMA  LACTIC ACID, PLASMA  LACTIC ACID, PLASMA  CG4 I-STAT (LACTIC ACID)  POCT I-STAT TROPONIN I    Imaging Review Dg Chest Port 1 View  04/05/2013   CLINICAL DATA:  Respiratory distress -post intubation.  EXAM: PORTABLE CHEST - 1 VIEW  COMPARISON:  12/07/2012  FINDINGS: An endotracheal tube is identified with tip 2.1 cm above the carina.  Moderate left lung atelectasis is identified. Underlying consolidation or pleural effusion is difficult to evaluate.  Right lung pulmonary vascular congestion is present.  There is no evidence of pneumothorax or acute bony abnormality.  An NG tube is present with tip overlying the proximal stomach.  IMPRESSION: Moderate left lung atelectasis and right lung pulmonary vascular congestion.  Support apparatus as described.   Electronically Signed   By: Laveda Abbe M.D.   On: 04/05/2013 12:27    EKG Interpretation    Date/Time:  Tuesday April 05 2013 11:36:55 EST Ventricular Rate:  93 PR Interval:    QRS Duration: 106 QT Interval:  408 QTC Calculation: 507 R Axis:   84 Text Interpretation:  Atrial fibrillation Low voltage, precordial leads Consider right ventricular hypertrophy Borderline T abnormalities, anterior leads Prolonged QT interval Similar to previous Confirmed by Gwendolyn Grant  MD, Avarae Zwart ([id number]) on 04/05/2013 11:48:59 AM           Level V caveat for acute respiratory distress  CRITICAL CARE Performed by: Dagmar Hait   Total critical care time: 45 minutes  Critical care time was exclusive of separately billable procedures and treating other patients.  Critical care was necessary to treat or prevent imminent or life-threatening deterioration.  Critical care was time spent personally by me on the following activities: development of treatment plan with patient and/or surrogate as well as nursing, discussions with consultants, evaluation of patient's response to treatment, examination of patient, obtaining history from patient or surrogate, ordering and performing treatments and interventions, ordering and review of laboratory studies,  ordering and review of radiographic studies, pulse oximetry and re-evaluation of patient's condition.  MDM   1. Acute respiratory failure with hypoxia   2. HCAP (healthcare-associated pneumonia)   3. Acute respiratory failure   4. UTI (urinary tract infection)    79 year old male presents in extreme respiratory distress. Was found to be gurgling at home that respirations. Wife did feed him breakfast this morning. Patient's CPAP was discontinued in 10-14 days ago. Wife has noticed increased respiratory effort and increasing somnolence since then. Patient has extensive medical history including diabetes, hypertension, coronary artery disease, recent admission several months ago for sepsis requiring intubation. Here he is mildly hypotensive with 90s systolic, he is hypoxic requiring 100% oxygen for preoxygenation, somnolent. He is unresponsive to painful stimuli. He is in acute respiratory distress. Patient is intubated. Patient had 2 lines placed and 2 L of fluid were started. Cultures were drawn patient was started on broad-spectrum antibiotics of vancomycin and cefepime. Labs show extreme leukocytosis, UTI, elevated bicarbonate consistent with chronic respiratory acidosis and metabolic compensation. Patient admitted to critical care.    Dagmar Hait, MD 04/05/13 [phone]

## 2013-04-05 NOTE — ED Notes (Signed)
CCM at bedside talking with family

## 2013-04-05 NOTE — Progress Notes (Addendum)
ANTIBIOTIC CONSULT NOTE - INITIAL  Pharmacy Consult for Vanco/Cefepime/Coumadin Indication: rule out sepsis  Allergies  Allergen Reactions  . Hydromorphone Hcl Other (See Comments)    Dilaudid caused Confusion   . Morphine Other (See Comments)    confusion  . Pregabalin Other (See Comments)    lyrica - caused hallucinations    Patient Measurements: Height: 5\' 9"  (175.3 cm) Weight: 220 lb 7.4 oz (100 kg) IBW/kg (Calculated) : 70.7 Adjusted Body Weight:    Vital Signs: BP: 136/88 mmHg (12/30 1220) Pulse Rate: 97 (12/30 1220) Intake/Output from previous day:   Intake/Output from this shift:    Labs:  Recent Labs  04/05/13 1140  WBC 20.5*  HGB 9.8*  PLT 626*   Estimated Creatinine Clearance: 81.4 ml/min (by C-G formula based on Cr of 0.83). No results found for this basename: VANCOTROUGH, VANCOPEAK, VANCORANDOM, GENTTROUGH, GENTPEAK, GENTRANDOM, TOBRATROUGH, TOBRAPEAK, TOBRARND, AMIKACINPEAK, AMIKACINTROU, AMIKACIN,  in the last 72 hours   Microbiology: No results found for this or any previous visit (from the past 720 hour(s)).  Medical History: Past Medical History  Diagnosis Date  . Diabetes mellitus   . Hypertension   . Hypercholesterolemia   . Esophageal stricture   . Pulmonary embolism 2008    chronic coumadin   . Coronary artery disease     DR. BERRY IS PT'S CARDIOLOGIST  . Atrial fibrillation     CHRONIC COUMADIN-pemanent atrial fib  . Anxiety     SINCE 1975   . Depression     SINCE 1975    . Shortness of breath     NOT SURE WHAT CAUSES SOB - BUT HE IS NOT ABLE TO BE ACTIVE - AND IS SOB WITH ANY ACTIVITY - USES OXYGEN 2 L NASAL CANNULA - ALL THE TIME -EXCEPT WHEN IN SHOWER OR CHANGING CLOTHES on home 02  . Peripheral vascular disease     TOLD SOME SMALL AMOUNT OF BLOCKAGE IN LEG WHEN HEART CATH DONE 2008  . Bladder tumor     HAS HAD HEMATURIA OFF AND ON - HAS FOLEY CATH THAT WAS PLACED JUNE 20TH, 2014  . GERD (gastroesophageal reflux disease)    . Edema     FEET AND LEGS MOST DAYS - SOMETIMES WEARS COMPRESSION HOSE  . Cancer     SKIN CANCERS  . Arthritis     S/P BILATERAL TOTAL KNEE REPLACEMENTS - BUT BOTH KNEES PAINFUL AND JOINTS WORN OUT; BAD RIGHT HIP - BUT NOT A CANDIDATE FOR HIP PREPLACMENT;  HAS SEVERE LOWER  BACK AND LEG PAIN - HAS SPINAL STENOSIS AND BULGING DISC; CURVATURE OF UPPER SPINE - UNABLE TO LAY HIS HEAD FLAT.  Marland Kitchen Pneumonia 2005  . Difficult intubation     DUE TO LIMITED NECK FLEXION AND SHORT NECK--ANESTHESIA RECORD FROM 2007 VATS SURGERY AT [facility] OBTAINED AND ON PT'S CHART.  Marland Kitchen Problems with hearing     WEARS BILATERAL HEARING AIDS    Medications:  See med rec pending  Assessment: Increasing SOB 79 y/o M presents to ED lethargic and unresponsive. Pt intubated. Patient has chronic respiratory failure with h/o COPD. Patient noted to be on chronic Coumadin for h/o PE and afib.  Labs: WBC 20.5, Hgb 9.8. Troponin 0.05, LA 1, INR 1.42, Scr 0.96 (estimated CrCl 80-90)  Goal of Therapy:  Vancomycin trough level 15-20 mcg/ml  Plan:  Cefepme 2g IV x 1 ordered in ED, then 1g IV q8hr Vanco 1g Iv x 1 ordered in ED, then 1g IV q8hr Coumadin 10mg   po/tube x 1 tonight   Frederick [name] S. Merilynn Finland, PharmD, BCPS Clinical Staff Pharmacist Pager [phone]  Frederick [name] 04/05/2013,12:28 PM

## 2013-04-05 NOTE — H&P (Addendum)
PULMONARY / CRITICAL CARE MEDICINE  Name: Frederick [name] MRN: [id number] DOB: November 23, 1929    ADMISSION DATE:  04/05/2013 CONSULTATION DATE:  12/30  REFERRING SERVICE:  EDP PRIMARY SERVICE: PCCM   CHIEF COMPLAINT:  Acute respiratory failure   BRIEF PATIENT DESCRIPTION: 79 yo with bladder cancer, deconditioning / wheelchair bound, COPD and chronic resp failure.  Recently discharged from [facility]. Admitted to [facility] 12/30 with acute on chronic respiratory failure in setting of AECOPD and HCAP.   SIGNIFICANT EVENTS / STUDIES:   LINES / TUBES: OETT 12/30 >>> OGT 12/30 >>> Foley 12/30 >>>  CULTURES: 12/30  Sputum >>> 12/30  Blood >>> 12/20  Urine >>>  ANTIBIOTICS: Vancomycin 12/30>>> Cefepime 12/30 >>>  The patient is encephalopathic and unable to provide history, which was obtained for available medical records.  HISTORY OF PRESENT ILLNESS:   79 year old male w/ chronic resp failure on basis of COPD,  just discharged from [facility] ~ 1 week prior to admit after a prolonged stay (since October 2014), for deconditioning after being hospitalized for UT associated sepsis and respiratory failure. Had been doing well/progressing prior to d/c. Then about 1 week prior to d/c he had a sleep study to see if he needed CPAP. Had been supported on BIPAP nightly up to that time. The split PSG did not show sig apnea and therefore BIPAP was discontinued. No further ABGs recorded. Per Family he got progressively lethargic even up to time of d/c on 12/12. Since home he got progressively lethargic to point he had difficulty eating. He was found by wife essentially unresponsive the am of 12/30. On arrival to ER he was hypercarbic and hypoxic requiring intubation and vent support. CXR showed Left sided airspace disease/atx.   PAST MEDICAL HISTORY :  Past Medical History  Diagnosis Date  . Diabetes mellitus   . Hypertension   . Hypercholesterolemia   . Esophageal stricture   . Pulmonary embolism 2008     chronic coumadin   . Coronary artery disease     DR. BERRY IS PT'S CARDIOLOGIST  . Atrial fibrillation     CHRONIC COUMADIN-pemanent atrial fib  . Anxiety     SINCE 1975   . Depression     SINCE 1975    . Shortness of breath     NOT SURE WHAT CAUSES SOB - BUT HE IS NOT ABLE TO BE ACTIVE - AND IS SOB WITH ANY ACTIVITY - USES OXYGEN 2 L NASAL CANNULA - ALL THE TIME -EXCEPT WHEN IN SHOWER OR CHANGING CLOTHES on home 02  . Peripheral vascular disease     TOLD SOME SMALL AMOUNT OF BLOCKAGE IN LEG WHEN HEART CATH DONE 2008  . Bladder tumor     HAS HAD HEMATURIA OFF AND ON - HAS FOLEY CATH THAT WAS PLACED JUNE 20TH, 2014  . GERD (gastroesophageal reflux disease)   . Edema     FEET AND LEGS MOST DAYS - SOMETIMES WEARS COMPRESSION HOSE  . Cancer     SKIN CANCERS  . Arthritis     S/P BILATERAL TOTAL KNEE REPLACEMENTS - BUT BOTH KNEES PAINFUL AND JOINTS WORN OUT; BAD RIGHT HIP - BUT NOT A CANDIDATE FOR HIP PREPLACMENT;  HAS SEVERE LOWER  BACK AND LEG PAIN - HAS SPINAL STENOSIS AND BULGING DISC; CURVATURE OF UPPER SPINE - UNABLE TO LAY HIS HEAD FLAT.  Marland Kitchen Pneumonia 2005  . Difficult intubation     DUE TO LIMITED NECK FLEXION AND SHORT NECK--ANESTHESIA RECORD FROM 2007 VATS SURGERY  AT [facility] OBTAINED AND ON PT'S CHART.  Marland Kitchen Problems with hearing     WEARS BILATERAL HEARING AIDS   Past Surgical History  Procedure Laterality Date  . Shoulder surgery      bilateral  . Pancreas surgery  2001    TUMOR REMOVED FROM PANCREAS AND SPLEEN ALSO REMOVED  . Total knee arthroplasty      bilateral  . Lung surgery  2007    non-cancer  VIDEO ASSISTED THORCOTOMY TO REMOVE FLUID / DECORTICATION. DR. Edwyna Shell  . Basil cell    . Cardiac catheterization  2008    totally occluded nondominant LCX wth mod. ostial RCA disease and nl. EF  . Esophageal stretch    . Skin cancer removed from left side of head - required skin graft from left leg    . Cartilage repair 198- left knee    . Joint replacement    . Transurethral  resection of bladder tumor with gyrus (turbt-gyrus) N/A 11/03/2012    Procedure: TRANSURETHRAL RESECTION OF BLADDER TUMOR WITH GYRUS (TURBT-GYRUS);  Surgeon: Milford Cage, MD;  Location: [facility];  Service: Urology;  Laterality: N/A;  . Cystoscopy/retrograde/ureteroscopy  11/03/2012    Procedure: CYSTOSCOPY/RETROGRADE/ LEFT URETEROSCOPY AND STENT PLACEMENT;  Surgeon: Milford Cage, MD;  Location: [facility];  Service: Urology;;  . Theador Hawthorne N/A 11/03/2012    Procedure: CYSTOGRAM;  Surgeon: Milford Cage, MD;  Location: [facility];  Service: Urology;  Laterality: N/A;   Prior to Admission medications   Medication Sig Start Date End Date Taking? Authorizing Provider  acetaminophen (TYLENOL) 160 MG/5ML solution Place 20.3 mLs (650 mg total) into feeding tube every 6 (six) hours as needed. 12/07/12   Maryruth Bun Rama, MD  acetylcysteine (MUCOMYST) 20 % nebulizer solution Take 4 mLs by nebulization 4 (four) times daily. 12/07/12   Maryruth Bun Rama, MD  albuterol (PROVENTIL) (5 MG/ML) 0.5% nebulizer solution Take 0.5 mLs (2.5 mg total) by nebulization 4 (four) times daily. 12/07/12   Maryruth Bun Rama, MD  albuterol (PROVENTIL) (5 MG/ML) 0.5% nebulizer solution Take 0.5 mLs (2.5 mg total) by nebulization every 6 (six) hours as needed for wheezing or shortness of breath. 12/07/12   Maryruth Bun Rama, MD  chlordiazePOXIDE (LIBRIUM) 5 MG capsule Take 1 capsule (5 mg total) by mouth 3 (three) times daily. 12/07/12   Maryruth Bun Rama, MD  chlorhexidine (PERIDEX) 0.12 % solution Use as directed 15 mLs in the mouth or throat 2 (two) times daily. 12/07/12   Christina P Rama, MD  DULoxetine (CYMBALTA) 30 MG capsule Take 1 capsule (30 mg total) by mouth at bedtime. 12/07/12   Maryruth Bun Rama, MD  famotidine (PEPCID) 20 MG tablet Take 1 tablet (20 mg total) by mouth at bedtime. 12/07/12   Maryruth Bun Rama, MD  fluconazole (DIFLUCAN) 2 mg/mL IVPB Inject 100 mLs (200 mg total) into the vein daily. 12/07/12   Maryruth Bun  Rama, MD  guaifenesin (ROBITUSSIN) 100 MG/5ML syrup Place 10 mLs (200 mg total) into feeding tube every 4 (four) hours. 12/07/12   Maryruth Bun Rama, MD  hydrALAZINE (APRESOLINE) 20 MG/ML injection Inject 0.5 mLs (10 mg total) into the vein every 4 (four) hours as needed (elevated BP). 12/07/12   Christina P Rama, MD  hyoscyamine (LEVSIN, ANASPAZ) 0.125 MG tablet Take 1 tablet (0.125 mg total) by mouth every 4 (four) hours as needed for cramping (bladder spasms). 11/03/12   Milford Cage, MD  insulin aspart (NOVOLOG) 100 UNIT/ML injection Inject 0-20 Units  into the skin every 4 (four) hours. 12/07/12   Maryruth Bun Rama, MD  insulin glargine (LANTUS) 100 UNIT/ML injection Inject 0.5 mLs (50 Units total) into the skin daily. Daily with breakfast 12/07/12   Maryruth Bun Rama, MD  irbesartan (AVAPRO) 150 MG tablet Take 150 mg by mouth daily with breakfast. -HOLD UNTIL FOLLOW UP WITH PRIMARY CARE DOCTOR 11/20/12   Vassie Loll, MD  isosorbide dinitrate (ISORDIL) 5 MG tablet Place 3 tablets (15 mg total) into feeding tube 2 (two) times daily. 12/07/12   Maryruth Bun Rama, MD  liver oil-zinc oxide (DESITIN) 40 % ointment Apply 1 application topically daily as needed for dry skin. Apply to bottom    Historical Provider, MD  Multiple Vitamin (MULTIVITAMIN) LIQD Place 5 mLs into feeding tube daily. 12/07/12   Maryruth Bun Rama, MD  Nutritional Supplements (FEEDING SUPPLEMENT, JEVITY 1.2 CAL,) LIQD Place 1,000 mLs into feeding tube continuous. 12/07/12   Maryruth Bun Rama, MD  nystatin cream (MYCOSTATIN) Apply topically 2 (two) times daily. 12/07/12   Christina P Rama, MD  piperacillin-tazobactam (ZOSYN) 3.375 GM/50ML IVPB Inject 50 mLs (3.375 g total) into the vein every 8 (eight) hours. 12/07/12   Christina P Rama, MD  senna-docusate (SENOKOT S) 8.6-50 MG per tablet Take 1 tablet by mouth 2 (two) times daily. 11/03/12   Milford Cage, MD  sodium chloride 0.9 % SOLN 250 mL with vancomycin 10 G SOLR 1,250 mg Inject 1,250  mg into the vein every 12 (twelve) hours. 12/07/12   Maryruth Bun Rama, MD  warfarin (COUMADIN) 5 MG tablet Take 5 mg by mouth daily.     Historical Provider, MD  Water For Irrigation, Sterile (FREE WATER) SOLN Place 250 mLs into feeding tube every 4 (four) hours. 12/07/12   Maryruth Bun Rama, MD   Allergies  Allergen Reactions  . Hydromorphone Hcl Other (See Comments)    Dilaudid caused Confusion   . Morphine Other (See Comments)    confusion  . Pregabalin Other (See Comments)    lyrica - caused hallucinations   FAMILY HISTORY:  Family History  Problem Relation Age of Onset  . Heart disease Mother   . Melanoma Father    SOCIAL HISTORY:  reports that he has never smoked. He has never used smokeless tobacco. He reports that he does not drink alcohol or use illicit drugs.  REVIEW OF SYSTEMS:    SUBJECTIVE:   VITAL SIGNS: Temp:  [96.4 F (35.8 C)] 96.4 F (35.8 C) (12/30 1237) Pulse Rate:  [85-102] 89 (12/30 1341) Resp:  [12-24] 12 (12/30 1341) BP: (89-136)/(48-88) 92/58 mmHg (12/30 1341) SpO2:  [79 %-100 %] 100 % (12/30 1341) FiO2 (%):  [100 %] 100 % (12/30 1341) Weight:  [100 kg (220 lb 7.4 oz)] 100 kg (220 lb 7.4 oz) (12/30 1152) HEMODYNAMICS:   VENTILATOR SETTINGS: Vent Mode:  [-] PRVC FiO2 (%):  [100 %] 100 % Set Rate:  [12 bmp] 12 bmp Vt Set:  [450 mL-600 mL] 600 mL PEEP:  [8 cmH20-10 cmH20] 8 cmH20 Plateau Pressure:  [36 cmH20-50 cmH20] 50 cmH20 INTAKE / OUTPUT: Intake/Output   None    PHYSICAL EXAMINATION: General:  Chronically ill appearing white male, currently sedated Neuro:  GCS 3, sedated HEENT:  Orally intubated, poor dentition  Cardiovascular:  rrr Lungs:  Prolonged exp wheeze, scattered rhonchi  Abdomen:  Distended. Scattered areas of ecchymosis  Musculoskeletal:  Intact w/ scattered areas of ecchymosis  Skin:  Scattered areas of ecchymosis   LABS:  CBC  Recent Labs Lab 04/05/13 1140  WBC 20.5*  HGB 9.8*  HCT 32.6*  PLT 626*    Coag's  Recent Labs Lab 04/05/13 1140  INR 1.42   BMET  Recent Labs Lab 04/05/13 1140  NA 133*  K 4.1  CL 82*  CO2 >45*  BUN 21  CREATININE 0.96  GLUCOSE 180*   Electrolytes  Recent Labs Lab 04/05/13 1140  CALCIUM 8.9   Sepsis Markers  Recent Labs Lab 04/05/13 1145  LATICACIDVEN 1.00   ABG  Recent Labs Lab 04/05/13 1244  PHART 7.373  PCO2ART 76.5*  PO2ART 261.0*   Liver Enzymes  Recent Labs Lab 04/05/13 1140  AST 17  ALT 11  ALKPHOS 154*  BILITOT <0.2*  ALBUMIN 1.7*   Cardiac Enzymes No results found for this basename: TROPONINI, PROBNP,  in the last 168 hours Glucose No results found for this basename: GLUCAP,  in the last 168 hours  CXR:  12/30 >>> Hardware in good position, airspace disease L  ASSESSMENT / PLAN:  PULMONARY A:  Acute on chronic respiratory failure. AECOPD.  HCAP. P:   Goal pH>7.30, SpO2>92 Continuous mechanical support VAP bundle Daily SBT Trend ABG/CXR Albuterol / Atrovent Solu-Medrol 40 q12h  CARDIOVASCULAR A:  Hemodynamically stable. No evidence of ischemia. Atrial fibrillation, controlled rate. P:  Goal MAP>60 Trend lactate / troponin  RENAL A: Hyponatremia. P:   Trend BMP NS x 1000 x 2 in ED NS@100   GASTROINTESTINAL A:   Protein calorie malnutrition Esophageal stricture with chronic dysphagia. GI Px P:  NPO TF per Nutritionist Protonix  HEMATOLOGIC A:   Anemia of chronic disease.  H/o recent pulmonary embolus. VTE Px is not required - full dose anticoagulation. P:  Coumadin per Pharmacy.  INFECTIOUS A:   HCAP. P:   Cxs / abx as above PCT  ENDOCRINE A:   DM w/ hyperglycemia. P:   SSI  NEUROLOGIC A:   Acute encephalopathy. Severe deconditioning, needs total care. Sedation for comfort and vent synchrony. P:   Goal RASS 0 to -1 Fentanyl / Versed gtt Daily WUA   DNR. Do NOT escalate care. Will discuss DNI after extubated or if no improvement after short ICU  trial.  I have personally obtained a history, examined the patient, evaluated laboratory and imaging results, formulated the assessment and plan and placed orders.  CRITICAL CARE: The patient is critically ill with multiple organ systems failure and requires high complexity decision making for assessment and support, frequent evaluation and titration of therapies, application of advanced monitoring technologies and extensive interpretation of multiple databases. Critical Care Time devoted to patient care services described in this note is 40 minutes.   Lonia Farber, MD Pulmonary and Critical Care Medicine [facility] Pager: [phone]  04/05/2013, 1:58 PM

## 2013-04-05 NOTE — ED Notes (Signed)
Pt sats remain at 85 % after intubation  Pt suctioned and peep up to 10 by resp and head of bed raised 30 degrees ,

## 2013-04-06 ENCOUNTER — Inpatient Hospital Stay (HOSPITAL_COMMUNITY): Payer: Medicare Other

## 2013-04-06 DIAGNOSIS — R404 Transient alteration of awareness: Secondary | ICD-10-CM

## 2013-04-06 DIAGNOSIS — J690 Pneumonitis due to inhalation of food and vomit: Secondary | ICD-10-CM

## 2013-04-06 DIAGNOSIS — R4182 Altered mental status, unspecified: Secondary | ICD-10-CM

## 2013-04-06 DIAGNOSIS — I509 Heart failure, unspecified: Secondary | ICD-10-CM

## 2013-04-06 DIAGNOSIS — I5030 Unspecified diastolic (congestive) heart failure: Secondary | ICD-10-CM

## 2013-04-06 LAB — GLUCOSE, CAPILLARY
Glucose-Capillary: 48 mg/dL — ABNORMAL LOW (ref 70–99)
Glucose-Capillary: 89 mg/dL (ref 70–99)
Glucose-Capillary: 105 mg/dL — ABNORMAL HIGH (ref 70–99)
Glucose-Capillary: 135 mg/dL — ABNORMAL HIGH (ref 70–99)
Glucose-Capillary: 182 mg/dL — ABNORMAL HIGH (ref 70–99)

## 2013-04-06 LAB — URINE CULTURE: Colony Count: >=100000

## 2013-04-06 LAB — BASIC METABOLIC PANEL
BUN: 21 mg/dL (ref 6–23)
CO2: 39 mEq/L — ABNORMAL HIGH (ref 19–32)
Chloride: 88 mEq/L — ABNORMAL LOW (ref 96–112)
Creatinine, Ser: 0.81 mg/dL (ref 0.50–1.35)
GFR calc Af Amer: >90 mL/min (ref >90)
GFR calc non Af Amer: 81 mL/min — ABNORMAL LOW (ref >90)
Potassium: 3.6 mEq/L — ABNORMAL LOW (ref 3.7–5.3)

## 2013-04-06 LAB — PROTIME-INR: Prothrombin Time: 18.5 seconds — ABNORMAL HIGH (ref 11.6–15.2)

## 2013-04-06 LAB — TROPONIN I: Troponin I: <0.3 ng/mL (ref <0.30)

## 2013-04-06 LAB — CBC
HCT: 30.8 % — ABNORMAL LOW (ref 39.0–52.0)
MCHC: 31.5 g/dL (ref 30.0–36.0)
MCV: 91.1 fL (ref 78.0–100.0)
Platelets: 507 10*3/uL — ABNORMAL HIGH (ref 150–400)
RDW: 16 % — ABNORMAL HIGH (ref 11.5–15.5)
WBC: 26.9 10*3/uL — ABNORMAL HIGH (ref 4.0–10.5)

## 2013-04-06 LAB — LACTIC ACID, PLASMA: Lactic Acid, Venous: 2.8 mmol/L — ABNORMAL HIGH (ref 0.5–2.2)

## 2013-04-06 MED ORDER — VITAL AF 1.2 CAL PO LIQD
1000.0000 mL | ORAL | Status: DC
  Administered 2013-04-06: 1000 mL
  Filled 2013-04-06 (×2): qty 1000

## 2013-04-06 MED ORDER — VITAL AF 1.2 CAL PO LIQD
1000.0000 mL | ORAL | Status: DC
  Filled 2013-04-06: qty 1000

## 2013-04-06 MED ORDER — WARFARIN SODIUM 5 MG PO TABS
5.0000 mg | ORAL_TABLET | Freq: Once | ORAL | Status: AC
  Administered 2013-04-06: 5 mg
  Filled 2013-04-06: qty 1

## 2013-04-06 MED ORDER — METHYLPREDNISOLONE SODIUM SUCC 40 MG IJ SOLR
40.0000 mg | Freq: Every day | INTRAMUSCULAR | Status: AC
  Administered 2013-04-07: 40 mg via INTRAVENOUS
  Filled 2013-04-06: qty 1

## 2013-04-06 MED ORDER — DEXTROSE 50 % IV SOLN
INTRAVENOUS | Status: AC
  Administered 2013-04-06: 50 mL
  Filled 2013-04-06: qty 50

## 2013-04-06 MED ORDER — FUROSEMIDE 10 MG/ML IJ SOLN
40.0000 mg | Freq: Four times a day (QID) | INTRAMUSCULAR | Status: AC
  Administered 2013-04-06 (×2): 40 mg via INTRAVENOUS
  Filled 2013-04-06 (×2): qty 4

## 2013-04-06 MED ORDER — PRO-STAT SUGAR FREE PO LIQD
30.0000 mL | Freq: Three times a day (TID) | ORAL | Status: DC
  Administered 2013-04-06 – 2013-04-08 (×5): 30 mL via ORAL
  Filled 2013-04-06 (×11): qty 30

## 2013-04-06 MED ORDER — MUPIROCIN 2 % EX OINT
TOPICAL_OINTMENT | Freq: Two times a day (BID) | CUTANEOUS | Status: DC
  Administered 2013-04-06 – 2013-04-09 (×7): via NASAL
  Filled 2013-04-06 (×2): qty 22

## 2013-04-06 MED ORDER — VITAL AF 1.2 CAL PO LIQD
1000.0000 mL | ORAL | Status: DC
  Administered 2013-04-06: 1000 mL
  Filled 2013-04-06 (×4): qty 1000

## 2013-04-06 MED ORDER — SODIUM CHLORIDE 0.9 % IV SOLN
1000.0000 mL | INTRAVENOUS | Status: DC
  Administered 2013-04-06: 1000 mL via INTRAVENOUS

## 2013-04-06 NOTE — Progress Notes (Signed)
INITIAL NUTRITION ASSESSMENT  DOCUMENTATION CODES Per approved criteria  -Obesity Unspecified   INTERVENTION: - Initiate Vital AF 1.2 at 20 ml/hr, increase to goal rate of 30 ml/hr after 4 hours with 30 ml Prostat TID, provides 1164 kcal (62% estimated needs), 99 g protein (100% estimated needs), 584 ml free water  NUTRITION DIAGNOSIS: Inadequate oral intake related to inability to eat as evidenced by NPO status.  Goal: Enteral nutrition to provide 60-70% of estimated calorie needs (22-25 kcals/kg ideal body weight) and 100% of estimated protein needs, based on ASPEN guidelines for permissive underfeeding in critically ill obese individuals.  Monitor:  TF advancement/tolerance, diet advancement, weight, labs  Reason for Assessment: Consult, TF protocol  79 y.o. male  Admitting Dx: Acute respiratory acidosis  ASSESSMENT: Patient with bladder cancer, COPD, DM, admitted with acute on chronic respiratory failure. He is currently intubated with OGT. Unable to obtain history from patient, but he does have a history of weight loss and poor intake. His weight is down 6% from his usual body weight of 223 pounds in 4 months.   Adult Enteral Nutrition Protocol started. Vital AF 1.2 ordered with goal rate of 40 ml/hr.   Height: Ht Readings from Last 1 Encounters:  04/05/13 5\' 3"  (1.6 m)  Per nursing home records, 68 inches  Weight: Wt Readings from Last 1 Encounters:  04/06/13 209 lb 3.5 oz (94.9 kg)    Ideal Body Weight: 154 pounds  % Ideal Body Weight: 136%  Wt Readings from Last 10 Encounters:  04/06/13 209 lb 3.5 oz (94.9 kg)  12/07/12 229 lb 4.5 oz (104 kg)  11/17/12 234 lb 5.6 oz (106.3 kg)  10/28/12 223 lb (101.152 kg)  10/12/12 223 lb (101.152 kg)  03/03/11 222 lb (100.699 kg)  04/13/08 222 lb (100.699 kg)  03/13/08 212 lb (96.163 kg)    Usual Body Weight: 223 pounds  % Usual Body Weight: 94%  BMI:  Body mass index is 31.8 kg/(m^2). Patient is obese, class  1  MV: 9.2 Temp: 37.3  Estimated Nutritional Needs: Kcal: 1871 kcal (1122-1309 kcal) Protein: 95-115 g Fluid: >2.8 L/day  Skin: Stage 1 pressure ulcer, sacrum  Diet Order: NPO  EDUCATION NEEDS: -No education needs identified at this time   Intake/Output Summary (Last 24 hours) at 04/06/13 1326 Last data filed at 04/06/13 1200  Gross per 24 hour  Intake 2455.75 ml  Output   1960 ml  Net 495.75 ml    Last BM: PTA   Labs:   Recent Labs Lab 04/05/13 1140 04/06/13 0210  NA 133* 135*  K 4.1 3.6*  CL 82* 88*  CO2 >45* 39*  BUN 21 21  CREATININE 0.96 0.81  CALCIUM 8.9 8.4  GLUCOSE 180* 61*    CBG (last 3)   Recent Labs  04/06/13 0524 04/06/13 0738 04/06/13 1208  GLUCAP 89 105* 135*    Scheduled Meds: . albuterol  6 puff Inhalation Q4H  . antiseptic oral rinse  1 application Mouth Rinse QID  . ceFEPime (MAXIPIME) IV  1 g Intravenous Q8H  . chlorhexidine  15 mL Mouth/Throat BID  . furosemide  40 mg Intravenous Q6H  . insulin aspart  0-15 Units Subcutaneous Q4H  . ipratropium  6 puff Inhalation Q4H  . [START ON 04/07/2013] methylPREDNISolone (SOLU-MEDROL) injection  40 mg Intravenous Daily  . mupirocin ointment   Nasal BID  . pantoprazole (PROTONIX) IV  40 mg Intravenous Q24H  . warfarin  5 mg Per Tube ONCE-1800  .  Warfarin - Pharmacist Dosing Inpatient   Does not apply q1800    Continuous Infusions: . sodium chloride 1,000 mL (04/06/13 1200)  . feeding supplement (VITAL AF 1.2 CAL)    . fentaNYL infusion INTRAVENOUS 50 mcg/hr (04/06/13 1200)    Past Medical History  Diagnosis Date  . Diabetes mellitus   . Hypertension   . Hypercholesterolemia   . Esophageal stricture   . Pulmonary embolism 2008    chronic coumadin   . Coronary artery disease     DR. BERRY IS PT'S CARDIOLOGIST  . Atrial fibrillation     CHRONIC COUMADIN-pemanent atrial fib  . Anxiety     SINCE 1975   . Depression     SINCE 1975    . Shortness of breath     NOT SURE WHAT  CAUSES SOB - BUT HE IS NOT ABLE TO BE ACTIVE - AND IS SOB WITH ANY ACTIVITY - USES OXYGEN 2 L NASAL CANNULA - ALL THE TIME -EXCEPT WHEN IN SHOWER OR CHANGING CLOTHES on home 02  . Peripheral vascular disease     TOLD SOME SMALL AMOUNT OF BLOCKAGE IN LEG WHEN HEART CATH DONE 2008  . Bladder tumor     HAS HAD HEMATURIA OFF AND ON - HAS FOLEY CATH THAT WAS PLACED JUNE 20TH, 2014  . GERD (gastroesophageal reflux disease)   . Edema     FEET AND LEGS MOST DAYS - SOMETIMES WEARS COMPRESSION HOSE  . Cancer     SKIN CANCERS  . Arthritis     S/P BILATERAL TOTAL KNEE REPLACEMENTS - BUT BOTH KNEES PAINFUL AND JOINTS WORN OUT; BAD RIGHT HIP - BUT NOT A CANDIDATE FOR HIP PREPLACMENT;  HAS SEVERE LOWER  BACK AND LEG PAIN - HAS SPINAL STENOSIS AND BULGING DISC; CURVATURE OF UPPER SPINE - UNABLE TO LAY HIS HEAD FLAT.  Marland Kitchen Pneumonia 2005  . Difficult intubation     DUE TO LIMITED NECK FLEXION AND SHORT NECK--ANESTHESIA RECORD FROM 2007 VATS SURGERY AT [facility] OBTAINED AND ON PT'S CHART.  Marland Kitchen Problems with hearing     WEARS BILATERAL HEARING AIDS    Past Surgical History  Procedure Laterality Date  . Shoulder surgery      bilateral  . Pancreas surgery  2001    TUMOR REMOVED FROM PANCREAS AND SPLEEN ALSO REMOVED  . Total knee arthroplasty      bilateral  . Lung surgery  2007    non-cancer  VIDEO ASSISTED THORCOTOMY TO REMOVE FLUID / DECORTICATION. DR. Edwyna Shell  . Basil cell    . Cardiac catheterization  2008    totally occluded nondominant LCX wth mod. ostial RCA disease and nl. EF  . Esophageal stretch    . Skin cancer removed from left side of head - required skin graft from left leg    . Cartilage repair 198- left knee    . Joint replacement    . Transurethral resection of bladder tumor with gyrus (turbt-gyrus) N/A 11/03/2012    Procedure: TRANSURETHRAL RESECTION OF BLADDER TUMOR WITH GYRUS (TURBT-GYRUS);  Surgeon: Milford Cage, MD;  Location: [facility];  Service: Urology;  Laterality: N/A;  .  Cystoscopy/retrograde/ureteroscopy  11/03/2012    Procedure: CYSTOSCOPY/RETROGRADE/ LEFT URETEROSCOPY AND STENT PLACEMENT;  Surgeon: Milford Cage, MD;  Location: [facility];  Service: Urology;;  . Theador Hawthorne N/A 11/03/2012    Procedure: CYSTOGRAM;  Surgeon: Milford Cage, MD;  Location: [facility];  Service: Urology;  Laterality: N/A;    Linnell Fulling, RD, LDN  Pager #: [phone] After-Hours Pager #: [phone]

## 2013-04-06 NOTE — Progress Notes (Signed)
CRITICAL VALUE ALERT  Critical value received: Positive blood cultures Gram (-) Rods . Both bottles Date of notification:04/05/2013 Time of notification: 27 Critical value read back:yes  Nurse who received alert:  Juanda Bond, RN  Value called to [place], RN @ [facility]

## 2013-04-06 NOTE — H&P (Signed)
PULMONARY / CRITICAL CARE MEDICINE  Name: Frederick [name] MRN: [id number] DOB: January 20, 1930    ADMISSION DATE:  04/05/2013 CONSULTATION DATE:  12/30  REFERRING SERVICE:  EDP PRIMARY SERVICE: PCCM   CHIEF COMPLAINT:  Acute respiratory failure   BRIEF PATIENT DESCRIPTION: 79 yo with bladder cancer, deconditioning / wheelchair bound, COPD and chronic resp failure.  Recently discharged from [facility]. Admitted to [facility] 12/30 with acute on chronic respiratory failure in setting of AECOPD and HCAP.   SIGNIFICANT EVENTS / STUDIES:   LINES / TUBES: OETT 12/30 >>> OGT 12/30 >>> Foley 12/30 >>>  CULTURES: 12/30  Sputum >>>Gram neg coccobacilli 12/30  Blood >>>GNR 12/20  Urine >>>  ANTIBIOTICS: Vancomycin 12/30>>>12/31 Cefepime 12/30 >>>    SUBJECTIVE:  No acute events over night, resting comfortably on vent   VITAL SIGNS: Temp:  [96.4 F (35.8 C)-99.1 F (37.3 C)] 98.2 F (36.8 C) (12/31 0739) Pulse Rate:  [52-104] 87 (12/31 0700) Resp:  [0-24] 12 (12/31 0700) BP: (74-142)/(43-88) 104/58 mmHg (12/31 0700) SpO2:  [75 %-100 %] 100 % (12/31 0700) FiO2 (%):  [50 %-100 %] 50 % (12/31 0308) Weight:  [94.7 kg (208 lb 12.4 oz)-100 kg (220 lb 7.4 oz)] 94.9 kg (209 lb 3.5 oz) (12/31 0435) HEMODYNAMICS:   VENTILATOR SETTINGS: Vent Mode:  [-] PRVC FiO2 (%):  [50 %-100 %] 50 % Set Rate:  [12 bmp] 12 bmp Vt Set:  [450 mL-600 mL] 600 mL PEEP:  [5 cmH20-10 cmH20] 5 cmH20 Plateau Pressure:  [30 cmH20-50 cmH20] 30 cmH20 INTAKE / OUTPUT: Intake/Output     12/30 0701 - 12/31 0700 12/31 0701 - 01/01 0700   I.V. (mL/kg) 1382 (14.6)    NG/GT 60    IV Piggyback 500    Total Intake(mL/kg) 1942 (20.5)    Urine (mL/kg/hr) 510    Total Output 510     Net +1432           PHYSICAL EXAMINATION: General:  Sedated on vent HEENT: PERRL, ETT PULM: rales bilaterally CV: Irreg irreg, no mgr AB: BS+, soft, nontender Ext: pitting edema bilaterally Neuro: sedated on vent  LABS:  CBC  Recent  Labs Lab 04/05/13 1140 04/06/13 0210  WBC 20.5* 26.9*  HGB 9.8* 9.7*  HCT 32.6* 30.8*  PLT 626* 507*   Coag's  Recent Labs Lab 04/05/13 1140 04/06/13 0320  INR 1.42 1.59*   BMET  Recent Labs Lab 04/05/13 1140 04/06/13 0210  NA 133* 135*  K 4.1 3.6*  CL 82* 88*  CO2 >45* 39*  BUN 21 21  CREATININE 0.96 0.81  GLUCOSE 180* 61*   Electrolytes  Recent Labs Lab 04/05/13 1140 04/06/13 0210  CALCIUM 8.9 8.4   Sepsis Markers  Recent Labs Lab 04/05/13 1145 04/05/13 1811 04/05/13 1813 04/06/13 0152 04/06/13 0153  LATICACIDVEN 1.00 2.9*  --   --  2.8*  PROCALCITON  --   --  0.26 0.30  --    ABG  Recent Labs Lab 04/05/13 1244  PHART 7.373  PCO2ART 76.5*  PO2ART 261.0*   Liver Enzymes  Recent Labs Lab 04/05/13 1140  AST 17  ALT 11  ALKPHOS 154*  BILITOT <0.2*  ALBUMIN 1.7*   Cardiac Enzymes  Recent Labs Lab 04/05/13 1812 04/06/13 0152  TROPONINI <0.30 <0.30   Glucose  Recent Labs Lab 04/05/13 1424 04/05/13 1838 04/05/13 2313 04/06/13 0338 04/06/13 0524  GLUCAP 157* 144* 88 48* 89    CXR:  12/30 >>> Hardware in good position, airspace disease L  ASSESSMENT / PLAN:  PULMONARY A:  Acute on chronic respiratory failure due to HCAP and pulm edema Baseline COPD Baseline hypercarbia P:   Goal pH>7.30, SpO2>92 Decrease TVol to 8cc/kg VAP bundle Daily SBT Trend ABG/CXR Scheduled Albuterol / Atrovent Wean solumedrol Consider home BIPAP for hypercarbia before discharge (wife says he has done really well with this despite normal sleep study)  CARDIOVASCULAR A:  Hemodynamically stable. Atrial fibrillation, controlled rate. CHF (per wife, 11/2012 echo normal LV, some mild pulm hypertension), now volume overloaded P:  Goal MAP>60 Tele Lasix today  RENAL A: Hyponatremia > improved with IVF P:   Trend BMP KVO fluids  GASTROINTESTINAL A:   Protein calorie malnutrition Esophageal stricture with chronic dysphagia. GI  Px P:  TF per Nutritionist Pantoprazole for stress ulcer prophylaxis  HEMATOLOGIC A:   Anemia of chronic disease.  H/o recent pulmonary embolus. VTE Px is not required - full dose anticoagulation. P:  Coumadin per Pharmacy. SCD's until INR therapeutic  INFECTIOUS A:   HCAP. GNR bacteremia from pneumonia P:   Cefepime until susceptibilities back D/C vanc  ENDOCRINE A:   DM w/ hyperglycemia. P:   SSI  NEUROLOGIC A:   Acute encephalopathy. Severe deconditioning, needs total care. Sedation for comfort and vent synchrony. P:   Goal RASS 0 to -1 Fentanyl / Versed gtt Daily WUA   Code status: mechanical ventilation only, do not escalate care (discussed again with wife 12/31)  I have personally obtained a history, examined the patient, evaluated laboratory and imaging results, formulated the assessment and plan and placed orders.  CRITICAL CARE: The patient is critically ill with multiple organ systems failure and requires high complexity decision making for assessment and support, frequent evaluation and titration of therapies, application of advanced monitoring technologies and extensive interpretation of multiple databases. Critical Care Time devoted to patient care services described in this note is 45 minutes.   Yolonda Kida PCCM Pager: [phone] Cell: [phone] If no response, call [phone]   04/06/2013, 7:57 AM

## 2013-04-06 NOTE — Progress Notes (Signed)
Repeat cbg 89

## 2013-04-06 NOTE — Progress Notes (Signed)
ANTICOAGULATION CONSULT NOTE - Follow Up Consult  Pharmacy Consult for Coumadin Indication: atrial fibrillation and history of PE  Allergies  Allergen Reactions  . Hydromorphone Hcl Other (See Comments)    Dilaudid caused Confusion   . Morphine Other (See Comments)    confusion  . Pregabalin Other (See Comments)    lyrica - caused hallucinations   Patient Measurements: Height: 5\' 3"  (160 cm) Weight: 209 lb 3.5 oz (94.9 kg) IBW/kg (Calculated) : 56.9 Vital Signs: Temp: 98.2 F (36.8 C) (12/31 0739) Temp src: Oral (12/31 0739) BP: 104/58 mmHg (12/31 0700) Pulse Rate: 87 (12/31 0700) Labs:  Recent Labs  04/05/13 1140 04/05/13 1812 04/06/13 0152 04/06/13 0210 04/06/13 0320  HGB 9.8*  --   --  9.7*  --   HCT 32.6*  --   --  30.8*  --   PLT 626*  --   --  507*  --   LABPROT 17.0*  --   --   --  18.5*  INR 1.42  --   --   --  1.59*  CREATININE 0.96  --   --  0.81  --   TROPONINI  --  <0.30 <0.30  --   --    Estimated Creatinine Clearance: 72.9 ml/min (by C-G formula based on Cr of 0.81).  Assessment: [name] on chronic Coumadin for atrial fibrillation and history of PE admitted in acute on chronic respiratory failure with presumed HCAP and sub-therapeutic INR. Home dose per patient reported as 2.5mg  daily.   Patient's INR up to 1.59 today after large dose of 10mg  on 12/30. Hemoglobin is low but stable. Platelets are elevated. No bleeding is reported. Suspect INR to rise sharply for high dose on 12/30 so will decrease dose tonight and monitor INR.   Goal of Therapy:  INR 2-3 Monitor platelets by anticoagulation protocol: Yes   Plan:  1. Coumadin 5mg  po/pt x1 tonight. 2. Daily PT/INR.   Link Snuffer, PharmD, BCPS Clinical Pharmacist [phone] 04/06/2013,8:31 AM

## 2013-04-06 NOTE — Progress Notes (Signed)
CRITICAL VALUE ALERT  Critical value received: Cbg 48  Date of notification:  04/05/2013  Time of notification:  0400 Critical value read back:no  Nurse who received alert:  Juanda Bond from [facility]

## 2013-04-06 NOTE — Progress Notes (Signed)
Respiratory therapy note- placed back to full support due to apnea.

## 2013-04-07 ENCOUNTER — Inpatient Hospital Stay (HOSPITAL_COMMUNITY): Payer: Medicare Other

## 2013-04-07 DIAGNOSIS — E876 Hypokalemia: Secondary | ICD-10-CM

## 2013-04-07 LAB — BASIC METABOLIC PANEL
BUN: 24 mg/dL — ABNORMAL HIGH (ref 6–23)
CO2: 39 mEq/L — ABNORMAL HIGH (ref 19–32)
Calcium: 8.8 mg/dL (ref 8.4–10.5)
Chloride: 91 mEq/L — ABNORMAL LOW (ref 96–112)
Creatinine, Ser: 0.85 mg/dL (ref 0.50–1.35)
GFR calc Af Amer: >90 mL/min (ref >90)
GFR calc non Af Amer: 80 mL/min — ABNORMAL LOW (ref >90)
Glucose, Bld: 122 mg/dL — ABNORMAL HIGH (ref 70–99)
Potassium: 3.2 mEq/L — ABNORMAL LOW (ref 3.7–5.3)
Sodium: 140 mEq/L (ref 137–147)

## 2013-04-07 LAB — PROTIME-INR
INR: 2.76 — ABNORMAL HIGH (ref 0.00–1.49)
Prothrombin Time: 28.2 seconds — ABNORMAL HIGH (ref 11.6–15.2)

## 2013-04-07 LAB — POCT I-STAT 3, ART BLOOD GAS (G3+)
Acid-Base Excess: 16 mmol/L — ABNORMAL HIGH (ref 0.0–2.0)
Bicarbonate: 42.6 mEq/L — ABNORMAL HIGH (ref 20.0–24.0)
O2 SAT: 98 %
PCO2 ART: 58 mmHg — AB (ref 35.0–45.0)
PH ART: 7.473 — AB (ref 7.350–7.450)
Patient temperature: 98.2
TCO2: 44 mmol/L (ref 0–100)
pO2, Arterial: 100 mmHg (ref 80.0–100.0)

## 2013-04-07 LAB — CBC WITH DIFFERENTIAL/PLATELET
Basophils Absolute: 0 10*3/uL (ref 0.0–0.1)
Basophils Relative: 0 % (ref 0–1)
Eosinophils Absolute: 0.2 10*3/uL (ref 0.0–0.7)
Eosinophils Relative: 1 % (ref 0–5)
HCT: 29.5 % — ABNORMAL LOW (ref 39.0–52.0)
Hemoglobin: 9.3 g/dL — ABNORMAL LOW (ref 13.0–17.0)
Lymphocytes Relative: 18 % (ref 12–46)
Lymphs Abs: 4.1 10*3/uL — ABNORMAL HIGH (ref 0.7–4.0)
MCH: 28.8 pg (ref 26.0–34.0)
MCHC: 31.5 g/dL (ref 30.0–36.0)
MCV: 91.3 fL (ref 78.0–100.0)
Monocytes Absolute: 1.4 10*3/uL — ABNORMAL HIGH (ref 0.1–1.0)
Monocytes Relative: 6 % (ref 3–12)
Neutro Abs: 16.9 10*3/uL — ABNORMAL HIGH (ref 1.7–7.7)
Neutrophils Relative %: 75 % (ref 43–77)
Platelets: 565 10*3/uL — ABNORMAL HIGH (ref 150–400)
RBC: 3.23 MIL/uL — ABNORMAL LOW (ref 4.22–5.81)
RDW: 16.8 % — ABNORMAL HIGH (ref 11.5–15.5)
WBC: 22.6 10*3/uL — ABNORMAL HIGH (ref 4.0–10.5)

## 2013-04-07 LAB — PROCALCITONIN: Procalcitonin: 0.33 ng/mL

## 2013-04-07 LAB — GLUCOSE, CAPILLARY
GLUCOSE-CAPILLARY: 136 mg/dL — AB (ref 70–99)
GLUCOSE-CAPILLARY: 205 mg/dL — AB (ref 70–99)
Glucose-Capillary: 136 mg/dL — ABNORMAL HIGH (ref 70–99)
Glucose-Capillary: 198 mg/dL — ABNORMAL HIGH (ref 70–99)
Glucose-Capillary: 85 mg/dL (ref 70–99)
Glucose-Capillary: 87 mg/dL (ref 70–99)

## 2013-04-07 LAB — CULTURE, RESPIRATORY W GRAM STAIN: Special Requests: NORMAL

## 2013-04-07 MED ORDER — FUROSEMIDE 10 MG/ML IJ SOLN
40.0000 mg | Freq: Every day | INTRAMUSCULAR | Status: AC
  Administered 2013-04-07 – 2013-04-08 (×2): 40 mg via INTRAVENOUS
  Filled 2013-04-07 (×3): qty 4

## 2013-04-07 MED ORDER — HALOPERIDOL LACTATE 5 MG/ML IJ SOLN
5.0000 mg | Freq: Four times a day (QID) | INTRAMUSCULAR | Status: DC | PRN
  Administered 2013-04-07: 5 mg via INTRAVENOUS
  Filled 2013-04-07: qty 1

## 2013-04-07 MED ORDER — POTASSIUM CHLORIDE 20 MEQ/15ML (10%) PO LIQD
ORAL | Status: AC
  Filled 2013-04-07: qty 30

## 2013-04-07 MED ORDER — PREDNISONE 20 MG PO TABS
30.0000 mg | ORAL_TABLET | Freq: Every day | ORAL | Status: DC
  Administered 2013-04-08: 30 mg via ORAL
  Filled 2013-04-07 (×3): qty 1

## 2013-04-07 MED ORDER — POTASSIUM CHLORIDE 20 MEQ/15ML (10%) PO LIQD
40.0000 meq | Freq: Once | ORAL | Status: AC
  Administered 2013-04-07: 40 meq
  Filled 2013-04-07: qty 30

## 2013-04-07 NOTE — Progress Notes (Signed)
ANTICOAGULATION CONSULT NOTE - Follow Up Consult  Pharmacy Consult for Coumadin Indication: atrial fibrillation and history of PE  Allergies  Allergen Reactions  . Hydromorphone Hcl Other (See Comments)    Dilaudid caused Confusion   . Morphine Other (See Comments)    confusion  . Pregabalin Other (See Comments)    lyrica - caused hallucinations   Patient Measurements: Height: 5\' 3"  (160 cm) Weight: 207 lb 7.3 oz (94.1 kg) IBW/kg (Calculated) : 56.9 Vital Signs: Temp: 98.6 F (37 C) (01/01 0824) Temp src: Oral (01/01 0824) BP: 113/64 mmHg (01/01 0900) Pulse Rate: 92 (01/01 0900) Labs:  Recent Labs  04/05/13 1140 04/05/13 1812 04/06/13 0152 04/06/13 0210 04/06/13 0320 04/07/13 0525  HGB 9.8*  --   --  9.7*  --  9.3*  HCT 32.6*  --   --  30.8*  --  29.5*  PLT 626*  --   --  507*  --  565*  LABPROT 17.0*  --   --   --  18.5* 28.2*  INR 1.42  --   --   --  1.59* 2.76*  CREATININE 0.96  --   --  0.81  --  0.85  TROPONINI  --  <0.30 <0.30  --   --   --    Estimated Creatinine Clearance: 69.2 ml/min (by C-G formula based on Cr of 0.85).  Assessment: 48 YOM on chronic Coumadin for atrial fibrillation and history of PE admitted in acute on chronic respiratory failure with presumed HCAP and sub-therapeutic INR. Home dose per patient reported as 2.5mg  daily.  INR today is now therapeutic at 2.76 but after very large jump from 1.59 yesterday (likely from large dose of 10mg  on 12/30). H/H stable, no bleeding noted.   Goal of Therapy:  INR 2-3   Plan:  1. No coumadin tonight due to large jump in INR 2. F/u AM INR  Salome Arnt, PharmD, BCPS Pager # [phone] 04/07/2013 9:40 AM

## 2013-04-07 NOTE — Progress Notes (Signed)
eLink Physician-Brief Progress Note Patient Name: Frederick [name] DOB: 1929-05-09 MRN: [id number]  Date of Service  04/07/2013   HPI/Events of Note   Pt agitated. RN wanting versed. I declined this request  eICU Interventions  Try haldol, increase fentanyl drip Restraints, avoid benzos   Intervention Category Major Interventions: Delirium, psychosis, severe agitation - evaluation and management  Asencion Noble 04/07/2013, 5:00 PM

## 2013-04-07 NOTE — Progress Notes (Signed)
Pt placed on SBT and failed due to increased agitation.  PS increased.  Pt still did not tolerate due to agitation followed by periods of apnea.  RT will continue to monitor.

## 2013-04-07 NOTE — Progress Notes (Signed)
Morning ABG results reported to MD.

## 2013-04-07 NOTE — Progress Notes (Signed)
PULMONARY / CRITICAL CARE MEDICINE  Name: Frederick [name] MRN: [id number] DOB: 24-Nov-1929    ADMISSION DATE:  04/05/2013 CONSULTATION DATE:  12/30  REFERRING SERVICE:  EDP PRIMARY SERVICE: PCCM   CHIEF COMPLAINT:  Acute respiratory failure   BRIEF PATIENT DESCRIPTION: 80 yo with bladder cancer, deconditioning / wheelchair bound, COPD and chronic resp failure.  Recently discharged from [place]. Admitted to [facility] 12/30 with acute on chronic respiratory failure in setting of AECOPD and HCAP.   SIGNIFICANT EVENTS / STUDIES:   LINES / TUBES: OETT 12/30 >>> OGT 12/30 >>> Foley 12/30 >>>  CULTURES: 12/30  Sputum >>>Gram neg coccobacilli 12/30  Blood >>>GNR 12/20  Urine >>>  ANTIBIOTICS: Vancomycin 12/30>>>12/31 Cefepime 12/30 >>>    SUBJECTIVE:  Weaned briefly yesterday afternoon, comfortable this morning   VITAL SIGNS: Temp:  [97.4 F (36.3 C)-98.4 F (36.9 C)] 98.4 F (36.9 C) (01/01 0424) Pulse Rate:  [83-105] 96 (01/01 0752) Resp:  [12-26] 14 (01/01 0752) BP: (84-152)/(41-109) 117/75 mmHg (01/01 0700) SpO2:  [100 %] 100 % (01/01 0752) FiO2 (%):  [40 %] 40 % (01/01 0752) Weight:  [94.1 kg (207 lb 7.3 oz)] 94.1 kg (207 lb 7.3 oz) (01/01 0405) HEMODYNAMICS:   VENTILATOR SETTINGS: Vent Mode:  [-] PRVC FiO2 (%):  [40 %] 40 % Set Rate:  [12 bmp] 12 bmp Vt Set:  [450 mL] 450 mL PEEP:  [5 cmH20] 5 cmH20 Pressure Support:  [14 cmH20] 14 cmH20 Plateau Pressure:  [22 cmH20-29 cmH20] 24 cmH20 INTAKE / OUTPUT: Intake/Output     12/31 0701 - 01/01 0700 01/01 0701 - 01/02 0700   P.O. 27741    I.V. (mL/kg) 884 (9.4)    NG/GT 575.5    IV Piggyback 100    Total Intake(mL/kg) 37677.5 (400.4)    Urine (mL/kg/hr) 2530 (1.1)    Total Output 2530     Net +35147.5           PHYSICAL EXAMINATION: General:  Awake vent HEENT: PERRL, ETT PULM: CTA B today CV: Irreg irreg, no mgr AB: BS+, soft, nontender Ext: pitting edema bilaterally Neuro: sedated on  vent  LABS:  CBC  Recent Labs Lab 04/05/13 1140 04/06/13 0210 04/07/13 0525  WBC 20.5* 26.9* 22.6*  HGB 9.8* 9.7* 9.3*  HCT 32.6* 30.8* 29.5*  PLT 626* 507* 565*   Coag's  Recent Labs Lab 04/05/13 1140 04/06/13 0320 04/07/13 0525  INR 1.42 1.59* 2.76*   BMET  Recent Labs Lab 04/05/13 1140 04/06/13 0210 04/07/13 0525  NA 133* 135* 140  K 4.1 3.6* 3.2*  CL 82* 88* 91*  CO2 >45* 39* 39*  BUN 21 21 24*  CREATININE 0.96 0.81 0.85  GLUCOSE 180* 61* 122*   Electrolytes  Recent Labs Lab 04/05/13 1140 04/06/13 0210 04/07/13 0525  CALCIUM 8.9 8.4 8.8   Sepsis Markers  Recent Labs Lab 04/05/13 1145 04/05/13 1811 04/05/13 1813 04/06/13 0152 04/06/13 0153  LATICACIDVEN 1.00 2.9*  --   --  2.8*  PROCALCITON  --   --  0.26 0.30  --    ABG  Recent Labs Lab 04/05/13 1244 04/07/13 0259  PHART 7.373 7.473*  PCO2ART 76.5* 58.0*  PO2ART 261.0* 100.0   Liver Enzymes  Recent Labs Lab 04/05/13 1140  AST 17  ALT 11  ALKPHOS 154*  BILITOT <0.2*  ALBUMIN 1.7*   Cardiac Enzymes  Recent Labs Lab 04/05/13 1812 04/06/13 0152  TROPONINI <0.30 <0.30   Glucose  Recent Labs Lab 04/06/13 0738 04/06/13 1208  04/06/13 1600 04/06/13 2018 04/06/13 2359 04/07/13 0423  GLUCAP 105* 135* 182* 163* 87 85    CXR:  12/30 >>> Hardware in good position, airspace disease L  ASSESSMENT / PLAN:  PULMONARY A:  Acute on chronic respiratory failure due to HCAP and pulm edema Baseline COPD Baseline hypercarbia P:   Goal pH>7.30, SpO2>92 Decrease TVol to 8cc/kg VAP bundle Daily SBT Trend ABG/CXR Scheduled Albuterol / Atrovent Last dose solumedrol today, start prednisone 1/2 Consider home BIPAP for hypercarbia before discharge (wife says he has done really well with this despite normal sleep study)  CARDIOVASCULAR A:  Hemodynamically stable. Atrial fibrillation, controlled rate. CHF (per wife, 11/2012 echo normal LV, some mild pulm hypertension), now  volume overloaded P:  Goal MAP>60 Tele Lasix again  RENAL A: Hyponatremia > resolved Hypokalemia P:   Trend BMP KVO fluids KCl scheduled  GASTROINTESTINAL A:   Protein calorie malnutrition Esophageal stricture with chronic dysphagia. GI Px P:  TF per Nutritionist Pantoprazole for stress ulcer prophylaxis  HEMATOLOGIC A:   Anemia of chronic disease.  H/o recent pulmonary embolus. VTE Px is not required - full dose anticoagulation. P:  Coumadin per Pharmacy. SCD's until INR therapeutic  INFECTIOUS A:   HCAP. GNR bacteremia from pneumonia P:   Cefepime until susceptibilities back D/C vanc  ENDOCRINE A:   DM w/ hyperglycemia. P:   SSI  NEUROLOGIC A:   Acute encephalopathy. Severe deconditioning, needs total care. Sedation for comfort and vent synchrony. P:   Goal RASS 0 to -1 Fentanyl / Versed gtt Daily WUA   Code status: mechanical ventilation only, do not escalate care (discussed again with wife 12/31)  I have personally obtained a history, examined the patient, evaluated laboratory and imaging results, formulated the assessment and plan and placed orders.  CRITICAL CARE: The patient is critically ill with multiple organ systems failure and requires high complexity decision making for assessment and support, frequent evaluation and titration of therapies, application of advanced monitoring technologies and extensive interpretation of multiple databases. Critical Care Time devoted to patient care services described in this note is 35 minutes.   Jillyn Hidden PCCM Pager: [phone] Cell: [phone] If no response, call [phone]   04/07/2013, 8:06 AM

## 2013-04-08 ENCOUNTER — Inpatient Hospital Stay (HOSPITAL_COMMUNITY): Payer: Medicare Other

## 2013-04-08 LAB — GLUCOSE, CAPILLARY
GLUCOSE-CAPILLARY: 120 mg/dL — AB (ref 70–99)
GLUCOSE-CAPILLARY: 146 mg/dL — AB (ref 70–99)
GLUCOSE-CAPILLARY: 212 mg/dL — AB (ref 70–99)
GLUCOSE-CAPILLARY: 70 mg/dL (ref 70–99)
Glucose-Capillary: 201 mg/dL — ABNORMAL HIGH (ref 70–99)
Glucose-Capillary: 120 mg/dL — ABNORMAL HIGH (ref 70–99)

## 2013-04-08 LAB — CBC WITH DIFFERENTIAL/PLATELET
Basophils Absolute: 0 10*3/uL (ref 0.0–0.1)
Basophils Relative: 0 % (ref 0–1)
EOS ABS: 0.4 10*3/uL (ref 0.0–0.7)
Eosinophils Relative: 2 % (ref 0–5)
HCT: 27.6 % — ABNORMAL LOW (ref 39.0–52.0)
HEMOGLOBIN: 8.6 g/dL — AB (ref 13.0–17.0)
Lymphocytes Relative: 18 % (ref 12–46)
Lymphs Abs: 4 10*3/uL (ref 0.7–4.0)
MCH: 28.3 pg (ref 26.0–34.0)
MCHC: 31.2 g/dL (ref 30.0–36.0)
MCV: 90.8 fL (ref 78.0–100.0)
MONOS PCT: 6 % (ref 3–12)
Monocytes Absolute: 1.4 10*3/uL — ABNORMAL HIGH (ref 0.1–1.0)
NEUTROS ABS: 16.2 10*3/uL — AB (ref 1.7–7.7)
NEUTROS PCT: 74 % (ref 43–77)
Platelets: 552 10*3/uL — ABNORMAL HIGH (ref 150–400)
RBC: 3.04 MIL/uL — AB (ref 4.22–5.81)
RDW: 16.9 % — ABNORMAL HIGH (ref 11.5–15.5)
WBC: 21.9 10*3/uL — ABNORMAL HIGH (ref 4.0–10.5)

## 2013-04-08 LAB — BASIC METABOLIC PANEL
BUN: 27 mg/dL — ABNORMAL HIGH (ref 6–23)
CO2: 39 mEq/L — ABNORMAL HIGH (ref 19–32)
Calcium: 8.6 mg/dL (ref 8.4–10.5)
Chloride: 93 mEq/L — ABNORMAL LOW (ref 96–112)
Creatinine, Ser: 0.77 mg/dL (ref 0.50–1.35)
GFR calc Af Amer: >90 mL/min (ref >90)
GFR, EST NON AFRICAN AMERICAN: 83 mL/min — AB (ref >90)
GLUCOSE: 110 mg/dL — AB (ref 70–99)
Potassium: 3.6 mEq/L — ABNORMAL LOW (ref 3.7–5.3)
SODIUM: 141 meq/L (ref 137–147)

## 2013-04-08 LAB — CULTURE, BLOOD (ROUTINE X 2)

## 2013-04-08 LAB — PROTIME-INR
INR: 2.28 — ABNORMAL HIGH (ref 0.00–1.49)
Prothrombin Time: 24.4 seconds — ABNORMAL HIGH (ref 11.6–15.2)

## 2013-04-08 MED ORDER — IPRATROPIUM-ALBUTEROL 0.5-2.5 (3) MG/3ML IN SOLN
3.0000 mL | Freq: Four times a day (QID) | RESPIRATORY_TRACT | Status: DC
  Administered 2013-04-08 – 2013-04-09 (×4): 3 mL via RESPIRATORY_TRACT
  Filled 2013-04-08 (×4): qty 3

## 2013-04-08 MED ORDER — CEFTRIAXONE SODIUM 1 G IJ SOLR
1.0000 g | INTRAMUSCULAR | Status: DC
  Administered 2013-04-08 – 2013-04-09 (×2): 1 g via INTRAVENOUS
  Filled 2013-04-08 (×5): qty 10

## 2013-04-08 MED ORDER — SODIUM CHLORIDE 0.9 % IV SOLN
500.0000 mg | Freq: Four times a day (QID) | INTRAVENOUS | Status: DC
  Administered 2013-04-08: 500 mg via INTRAVENOUS
  Filled 2013-04-08 (×3): qty 500

## 2013-04-08 MED ORDER — FUROSEMIDE 10 MG/ML IJ SOLN
40.0000 mg | Freq: Once | INTRAMUSCULAR | Status: AC
  Administered 2013-04-09: 40 mg via INTRAVENOUS

## 2013-04-08 MED ORDER — WARFARIN SODIUM 5 MG PO TABS
5.0000 mg | ORAL_TABLET | Freq: Once | ORAL | Status: DC
  Filled 2013-04-08: qty 1

## 2013-04-08 MED ORDER — FENTANYL CITRATE 0.05 MG/ML IJ SOLN: 12.5000 ug | INTRAMUSCULAR | Status: DC | PRN

## 2013-04-08 NOTE — Progress Notes (Signed)
PULMONARY / CRITICAL CARE MEDICINE  Name: Stephfon Bovey MRN: [id number] DOB: 02-05-1930    ADMISSION DATE:  04/05/2013 CONSULTATION DATE:  12/30  REFERRING SERVICE:  EDP PRIMARY SERVICE: PCCM   CHIEF COMPLAINT:  Acute respiratory failure   BRIEF PATIENT DESCRIPTION: 80 yo with bladder cancer, deconditioning / wheelchair bound, COPD and chronic resp failure.  Recently discharged from [place]. Admitted to [facility] 12/30 with acute on chronic respiratory failure in setting of AECOPD and HCAP.   SIGNIFICANT EVENTS / STUDIES:   LINES / TUBES: OETT 12/30 >>> OGT 12/30 >>> Foley 12/30 >>>  CULTURES: 12/30  Sputum >>>Moraxella catarrhalis, B-lactamase + 12/30  Blood >>>E. coli 12/20  Urine >>>mult bacterial morphotypes  ANTIBIOTICS: Vancomycin 12/30>>>12/31 Cefepime 12/30 >>>1/2 Ceftriaxone 1/2    SUBJECTIVE:  Weaned briefly yesterday with subsequent agitation requiring increased pressure support, comfortable this morning   VITAL SIGNS: Temp:  [97.9 F (36.6 C)-99.1 F (37.3 C)] 97.9 F (36.6 C) (01/02 0742) Pulse Rate:  [85-110] 97 (01/02 0756) Resp:  [12-31] 28 (01/02 0756) BP: (89-120)/(50-81) 99/50 mmHg (01/02 0700) SpO2:  [99 %-100 %] 100 % (01/02 0756) FiO2 (%):  [40 %] 40 % (01/02 0756) Weight:  [207 lb 0.2 oz (93.9 kg)] 207 lb 0.2 oz (93.9 kg) (01/02 0416) HEMODYNAMICS:   VENTILATOR SETTINGS: Vent Mode:  [-] CPAP;PSV FiO2 (%):  [40 %] 40 % Set Rate:  [12 bmp] 12 bmp Vt Set:  [450 mL] 450 mL PEEP:  [5 cmH20] 5 cmH20 Pressure Support:  [5 cmH20] 5 cmH20 Plateau Pressure:  [17 YCX44-81 cmH20] 24 cmH20 INTAKE / OUTPUT: Intake/Output     01/01 0701 - 01/02 0700 01/02 0701 - 01/03 0700   P.O.     I.V. (mL/kg) 620 (6.6)    NG/GT 680    IV Piggyback 100    Total Intake(mL/kg) 1400 (14.9)    Urine (mL/kg/hr) 2590 (1.1)    Total Output 2590     Net -1190           PHYSICAL EXAMINATION:  General:  Awake, alert, mechanically ventilated, family at bedside HEENT:  PERRLA, ETT in place PULM: CTA B lung fields CV: Irreg irreg, no murmurs appreciated AB: BS+, soft, nontender Ext: 2+ pitting LE edema bilaterally Neuro: eyes open, follows commands  LABS:  CBC  Recent Labs Lab 04/06/13 0210 04/07/13 0525 04/08/13 0355  WBC 26.9* 22.6* 21.9*  HGB 9.7* 9.3* 8.6*  HCT 30.8* 29.5* 27.6*  PLT 507* 565* 552*   Coag's  Recent Labs Lab 04/06/13 0320 04/07/13 0525 04/08/13 0355  INR 1.59* 2.76* 2.28*   BMET  Recent Labs Lab 04/06/13 0210 04/07/13 0525 04/08/13 0355  NA 135* 140 141  K 3.6* 3.2* 3.6*  CL 88* 91* 93*  CO2 39* 39* 39*  BUN 21 24* 27*  CREATININE 0.81 0.85 0.77  GLUCOSE 61* 122* 110*   Electrolytes  Recent Labs Lab 04/06/13 0210 04/07/13 0525 04/08/13 0355  CALCIUM 8.4 8.8 8.6   Sepsis Markers  Recent Labs Lab 04/05/13 1145 04/05/13 1811 04/05/13 1813 04/06/13 0152 04/06/13 0153 04/07/13 0525  LATICACIDVEN 1.00 2.9*  --   --  2.8*  --   PROCALCITON  --   --  0.26 0.30  --  0.33   ABG  Recent Labs Lab 04/05/13 1244 04/07/13 0259  PHART 7.373 7.473*  PCO2ART 76.5* 58.0*  PO2ART 261.0* 100.0   Liver Enzymes  Recent Labs Lab 04/05/13 1140  AST 17  ALT 11  ALKPHOS 154*  BILITOT <0.2*  ALBUMIN 1.7*   Cardiac Enzymes  Recent Labs Lab 04/05/13 1812 04/06/13 0152  TROPONINI <0.30 <0.30   Glucose  Recent Labs Lab 04/07/13 1102 04/07/13 1544 04/07/13 1949 04/07/13 2350 04/08/13 0430 04/08/13 0740  GLUCAP 136* 198* 205* 70 146* 201*    CXR:  12/30 >>> Hardware in good position, airspace disease L 12/31>>> bilateral pulm edema, small left pleural effusion, stable lines/tubes 1/1>>> persistent Left basilar opacification effusion vs atelectasis, cannot exclude infection, likely improving bilateral perihilar edema 1/2 >>> persistent LLL infiltrate c/w PNA, possible asymmetric pulm edema, stable lines/tubes  ASSESSMENT / PLAN:  PULMONARY A:  Acute on chronic respiratory failure  due to HCAP and pulm edema Baseline COPD Baseline hypercarbia P:   Goal pH>7.30, SpO2>92 VAP bundle Daily SBT -->tolerating well today  Trend ABG/CXR Scheduled Albuterol / Atrovent Day #1 prednisone today 04/08/2013 Consider home BIPAP for hypercarbia before discharge given reported benefit per wife despite normal sleep study  CARDIOVASCULAR A:  Hemodynamically stable. Atrial fibrillation rate controlled  CHF likely diastolic ( 08/1882 echo normal LV, some mild pulm hypertension), improvement in volume overload today after Lasix IV P:  Goal MAP>60 Tele Currently Net ~-1L Output, Cont Lasix IV 40 mg qd for now as kidney function tolerates, currently no indication of AKI  Recent Labs Lab 04/05/13 1140 04/06/13 0210 04/07/13 0525 04/08/13 0355  CREATININE 0.96 0.81 0.85 0.77     RENAL A: Hyponatremia > resolved Hypokalemia --> close to normal after supplemental KCL 40 mEq x1 P:   Trend BMP KVO IV fluids Cont KCl scheduled while on Lasix   Recent Labs Lab 04/05/13 1140 04/06/13 0210 04/07/13 0525 04/08/13 0355  NA 133* 135* 140 141     Recent Labs Lab 04/05/13 1140 04/06/13 0210 04/07/13 0525 04/08/13 0355  K 4.1 3.6* 3.2* 3.6*    GASTROINTESTINAL A:   Protein calorie malnutrition Esophageal stricture with chronic dysphagia. GI Px P:  TF per Nutritionist Pantoprazole for stress ulcer prophylaxis  HEMATOLOGIC A:   Anemia of chronic disease.  H/o recent pulmonary embolus. VTE Px is not required - full dose anticoagulation. P:  Coumadin per Pharmacy. INR therapeutic  Recent Labs Lab 04/05/13 1140 04/06/13 0320 04/07/13 0525 04/08/13 0355  INR 1.42 1.59* 2.76* 2.28*    INFECTIOUS A:   HCAP. E.coli  bacteremia  P:   D/c Cefepime  Consulted with Pharm with recommendation for Ceftriaxone to cover E.coli and Moraxella to start 1st dose today   ENDOCRINE A:   DM w/ hyperglycemia. P:   SSI  NEUROLOGIC A:   Acute  encephalopathy. Severe deconditioning, needs total care. Sedation for comfort and vent synchrony. P:   Goal RASS 0 to -1 Cont Fentanyl  gtt Daily WUA   Code status: OK to re-intubate, but no pressors, CPR, shocks. Lengthy conversation with wife today confirming this.  Disposition: [facility] bed available today.  Will monitor for tolerance of extubation, if tolerated will plan for d/c to [facility] tomorrow.  Code status: mechanical ventilation only, do not escalate care (discussed again with wife 1/2 who does not want full DNI)   Dorian Heckle Internal Medicine Resident  PGY3   04/08/2013, 9:14 AM   Attending:  I have seen and examined the patient with nurse practitioner/resident and agree with the note above.   CC time 45 minutes.  Jillyn Hidden PCCM Pager: [phone] Cell: [phone] If no response, call [phone]

## 2013-04-08 NOTE — Progress Notes (Signed)
150cc's of Fentanyl wasted in sink in medication room. Mavis RN as a witness.   Wayland Denis RN

## 2013-04-08 NOTE — Evaluation (Signed)
Physical Therapy Evaluation Patient Details Name: Frederick [name] MRN: [id number] DOB: 01/01/1930 Today's Date: 04/08/2013 Time: 4034-7425 PT Time Calculation (min): 25 min  PT Assessment / Plan / Recommendation History of Present Illness  80 yo with bladder cancer, deconditioning / wheelchair bound, COPD and chronic resp failure.  Recently discharged from [place]. Admitted to [facility] 12/30 with acute on chronic respiratory failure in setting of AECOPD and HCAP.  Clinical Impression  Patient demonstrates deficits in mobility as indicated below. Will benefit from continued skilled PT to address deficits and maximize function. Will continue to see and progress activity as tolerated. Rec SNF upon discharge.    PT Assessment  Patient needs continued PT services    Follow Up Recommendations  SNF;Supervision/Assistance - 24 hour          Equipment Recommendations  None recommended by PT       Frequency Min 3X/week    Precautions / Restrictions Restrictions Weight Bearing Restrictions: No   Pertinent Vitals/Pain SpO2 100, HR elevate from to 126 with activity EOB      Mobility  Bed Mobility Bed Mobility: Supine to Sit;Sitting - Scoot to [employer] of Bed;Sit to Supine;Scooting to [facility] Supine to Sit: 1: +2 Total assist Supine to Sit: Patient Percentage: 20% Sitting - Scoot to Edge of Bed: 1: +2 Total assist Sitting - Scoot to Edge of Bed: Patient Percentage: 20% Sit to Supine: 1: +2 Total assist Sit to Supine: Patient Percentage: 10% Scooting to HOB: 1: +2 Total assist Scooting to [facility]: Patient Percentage: 10% Transfers Transfers: Not assessed Ambulation/Gait Ambulation/Gait Assistance: Not tested (comment)    Exercises Total Joint Exercises Knee Flexion: AAROM;Both;10 reps General Exercises - Lower Extremity Ankle Circles/Pumps: AROM;Both;10 reps Heel Slides: PROM;Both;5 reps   PT Diagnosis: Generalized weakness  PT Problem List: Decreased strength;Decreased range of  motion;Decreased activity tolerance;Decreased balance;Decreased mobility;Decreased coordination;Obesity;Pain PT Treatment Interventions: DME instruction;Functional mobility training;Therapeutic activities;Therapeutic exercise;Balance training;Patient/family education     PT Goals(Current goals can be found in the care plan section) Acute Rehab PT Goals Patient Stated Goal: none stated PT Goal Formulation: With patient Time For Goal Achievement: 04/22/13 Potential to Achieve Goals: Fair  Visit Information  Last PT Received On: 04/08/13 Assistance Needed: +2 History of Present Illness: 80 yo with bladder cancer, deconditioning / wheelchair bound, COPD and chronic resp failure.  Recently discharged from [place]. Admitted to [facility] 12/30 with acute on chronic respiratory failure in setting of AECOPD and HCAP.       Prior [place] expects to be discharged to:: Skilled nursing facility Additional Comments: [facility] Prior Function Level of Independence: Needs assistance Comments: w/c bound at baseline per patient 1 year Communication Communication: HOH;Expressive difficulties (speech difficult to understand) Dominant Hand: Right    Cognition  Cognition Arousal/Alertness: Awake/alert Overall Cognitive Status: No family/caregiver present to determine baseline cognitive functioning    Extremity/Trunk Assessment Lower Extremity Assessment Lower Extremity Assessment: Generalized weakness   Balance Balance Balance Assessed: Yes Static Sitting Balance Static Sitting - Balance Support: Feet supported Static Sitting - Level of Assistance: 3: Mod assist Static Sitting - Comment/# of Minutes: 12 minutes EOB Dynamic Sitting Balance Dynamic Sitting - Balance Support: Feet supported;During functional activity Dynamic Sitting - Balance Activities: Lateral lean/weight shifting;Forward lean/weight shifting;Trunk control activities  End of  Session PT - End of Session Equipment Utilized During Treatment: Oxygen Activity Tolerance: Treatment limited secondary to medical complications (Comment) (HR elevated to 120s with sitting EOB with PROM and ther-ex)  Patient left: in bed;with call bell/phone within reach Nurse Communication: Mobility status  GP     Duncan Dull 04/08/2013, 3:46 PM Alben Deeds, [place] DPT  [phone]

## 2013-04-08 NOTE — Procedures (Signed)
Extubation Procedure Note  Patient Details:   Name: Frederick [name] DOB: July 31, 1929 MRN: [id number]   Airway Documentation:     Evaluation  O2 sats: stable throughout Complications: No apparent complications Patient did tolerate procedure well. Bilateral Breath Sounds: Rhonchi Suctioning: Airway Yes  Carson Myrtle 04/08/2013, 12:20 PM

## 2013-04-08 NOTE — Progress Notes (Signed)
ANTICOAGULATION CONSULT NOTE - Follow Up Consult  Pharmacy Consult for Coumadin Indication: atrial fibrillation and history of PE  Allergies  Allergen Reactions  . Hydromorphone Hcl Other (See Comments)    Dilaudid caused Confusion   . Morphine Other (See Comments)    confusion  . Pregabalin Other (See Comments)    lyrica - caused hallucinations   Patient Measurements: Height: 5\' 3"  (160 cm) Weight: 207 lb 0.2 oz (93.9 kg) IBW/kg (Calculated) : 56.9 Vital Signs: Temp: 99.1 F (37.3 C) (01/02 0432) Temp src: Oral (01/02 0432) BP: 99/50 mmHg (01/02 0700) Pulse Rate: 85 (01/02 0700) Labs:  Recent Labs  04/05/13 1812 04/06/13 0152 04/06/13 0210 04/06/13 0320 04/07/13 0525 04/08/13 0355  HGB  --   --  9.7*  --  9.3* 8.6*  HCT  --   --  30.8*  --  29.5* 27.6*  PLT  --   --  507*  --  565* 552*  LABPROT  --   --   --  18.5* 28.2* 24.4*  INR  --   --   --  1.59* 2.76* 2.28*  CREATININE  --   --  0.81  --  0.85 0.77  TROPONINI <0.30 <0.30  --   --   --   --    Estimated Creatinine Clearance: 73.4 ml/min (by C-G formula based on Cr of 0.77).  Assessment: 28 YOM on chronic Coumadin for atrial fibrillation and history of PE admitted in acute on chronic respiratory failure with presumed HCAP and sub-therapeutic INR. Home dose per patient reported as 2.5mg  daily.  INR today remains therapeutic at 2.28. H/H stable, no bleeding noted.   Goal of Therapy:  INR 2-3   Plan:  1. Coumadin 5mg  PO x 1 tonight 2. F/u AM INR  Salome Arnt, PharmD, BCPS Pager # [phone] 04/08/2013 7:41 AM

## 2013-04-09 DIAGNOSIS — J9610 Chronic respiratory failure, unspecified whether with hypoxia or hypercapnia: Secondary | ICD-10-CM

## 2013-04-09 LAB — CBC
HEMATOCRIT: 29.2 % — AB (ref 39.0–52.0)
Hemoglobin: 9 g/dL — ABNORMAL LOW (ref 13.0–17.0)
MCH: 28.8 pg (ref 26.0–34.0)
MCHC: 30.8 g/dL (ref 30.0–36.0)
MCV: 93.3 fL (ref 78.0–100.0)
PLATELETS: 562 10*3/uL — AB (ref 150–400)
RBC: 3.13 MIL/uL — ABNORMAL LOW (ref 4.22–5.81)
RDW: 17.4 % — ABNORMAL HIGH (ref 11.5–15.5)
WBC: 19.2 10*3/uL — ABNORMAL HIGH (ref 4.0–10.5)

## 2013-04-09 LAB — POCT I-STAT 3, ART BLOOD GAS (G3+)
ACID-BASE EXCESS: 20 mmol/L — AB (ref 0.0–2.0)
Bicarbonate: 49 mEq/L — ABNORMAL HIGH (ref 20.0–24.0)
O2 Saturation: 97 %
Patient temperature: 98.5
TCO2: >50 mmol/L (ref 0–100)
pCO2 arterial: 80.6 mmHg (ref 35.0–45.0)
pH, Arterial: 7.391 (ref 7.350–7.450)
pO2, Arterial: 104 mmHg — ABNORMAL HIGH (ref 80.0–100.0)

## 2013-04-09 LAB — BASIC METABOLIC PANEL
BUN: 25 mg/dL — AB (ref 6–23)
CO2: 45 mEq/L (ref 19–32)
Calcium: 8.8 mg/dL (ref 8.4–10.5)
Chloride: 93 mEq/L — ABNORMAL LOW (ref 96–112)
Creatinine, Ser: 0.78 mg/dL (ref 0.50–1.35)
GFR calc Af Amer: >90 mL/min (ref >90)
GFR, EST NON AFRICAN AMERICAN: 82 mL/min — AB (ref >90)
Glucose, Bld: 115 mg/dL — ABNORMAL HIGH (ref 70–99)
Potassium: 3.1 mEq/L — ABNORMAL LOW (ref 3.7–5.3)
Sodium: 146 mEq/L (ref 137–147)

## 2013-04-09 LAB — GLUCOSE, CAPILLARY
GLUCOSE-CAPILLARY: 133 mg/dL — AB (ref 70–99)
GLUCOSE-CAPILLARY: 97 mg/dL (ref 70–99)
Glucose-Capillary: 134 mg/dL — ABNORMAL HIGH (ref 70–99)
Glucose-Capillary: 144 mg/dL — ABNORMAL HIGH (ref 70–99)
Glucose-Capillary: 147 mg/dL — ABNORMAL HIGH (ref 70–99)
Glucose-Capillary: 157 mg/dL — ABNORMAL HIGH (ref 70–99)
Glucose-Capillary: 20 mg/dL — CL (ref 70–99)

## 2013-04-09 LAB — PROTIME-INR
INR: 1.58 — AB (ref 0.00–1.49)
Prothrombin Time: 18.4 seconds — ABNORMAL HIGH (ref 11.6–15.2)

## 2013-04-09 MED ORDER — WARFARIN SODIUM 5 MG PO TABS
5.0000 mg | ORAL_TABLET | Freq: Once | ORAL | Status: DC
  Filled 2013-04-09: qty 1

## 2013-04-09 MED ORDER — WARFARIN SODIUM 5 MG PO TABS: 5.0000 mg | ORAL_TABLET | Freq: Once | ORAL | Status: DC

## 2013-04-09 MED ORDER — IPRATROPIUM-ALBUTEROL 0.5-2.5 (3) MG/3ML IN SOLN: 3.0000 mL | Freq: Four times a day (QID) | RESPIRATORY_TRACT | Status: DC

## 2013-04-09 MED ORDER — MUPIROCIN 2 % EX OINT: TOPICAL_OINTMENT | Freq: Two times a day (BID) | CUTANEOUS | Status: DC

## 2013-04-09 MED ORDER — POTASSIUM CHLORIDE 10 MEQ/100ML IV SOLN
10.0000 meq | INTRAVENOUS | Status: AC
  Administered 2013-04-09 (×4): 10 meq via INTRAVENOUS
  Filled 2013-04-09 (×2): qty 100

## 2013-04-09 MED ORDER — BIOTENE DRY MOUTH MT LIQD: 1.0000 "application " | Freq: Four times a day (QID) | OROMUCOSAL | Status: DC

## 2013-04-09 MED ORDER — INSULIN ASPART 100 UNIT/ML ~~LOC~~ SOLN: 0.0000 [IU] | SUBCUTANEOUS | Status: DC

## 2013-04-09 MED ORDER — METHYLPREDNISOLONE SODIUM SUCC 40 MG IJ SOLR
25.0000 mg | Freq: Every day | INTRAMUSCULAR | Status: DC
  Administered 2013-04-09: 25 mg via INTRAVENOUS
  Filled 2013-04-09: qty 0.63

## 2013-04-09 MED ORDER — DEXTROSE 5 % IV SOLN: 1.0000 g | INTRAVENOUS | Status: AC

## 2013-04-09 MED ORDER — METHYLPREDNISOLONE SODIUM SUCC 40 MG IJ SOLR: 25.0000 mg | Freq: Every day | INTRAMUSCULAR | Status: DC

## 2013-04-09 MED ORDER — DEXTROSE 50 % IV SOLN
INTRAVENOUS | Status: AC
  Administered 2013-04-09: 50 mL
  Filled 2013-04-09: qty 50

## 2013-04-09 NOTE — Progress Notes (Signed)
ANTICOAGULATION CONSULT NOTE - Follow Up Consult  Pharmacy Consult for Coumadin Indication: atrial fibrillation and history of PE  Allergies  Allergen Reactions  . Hydromorphone Hcl Other (See Comments)    Dilaudid caused Confusion   . Morphine Other (See Comments)    confusion  . Pregabalin Other (See Comments)    lyrica - caused hallucinations   Patient Measurements: Height: 5\' 3"  (160 cm) Weight: 203 lb 11.3 oz (92.4 kg) IBW/kg (Calculated) : 56.9 Vital Signs: Temp: 98.5 F (36.9 C) (01/03 0744) Temp src: Oral (01/03 0744) BP: 120/66 mmHg (01/03 0900) Pulse Rate: 87 (01/03 0900) Labs:  Recent Labs  04/07/13 0525 04/08/13 0355 04/09/13 0335 04/09/13 0840  HGB 9.3* 8.6*  --  9.0*  HCT 29.5* 27.6*  --  29.2*  PLT 565* 552*  --  562*  LABPROT 28.2* 24.4* 18.4*  --   INR 2.76* 2.28* 1.58*  --   CREATININE 0.85 0.77  --  0.78   Estimated Creatinine Clearance: 72.8 ml/min (by C-G formula based on Cr of 0.78).  Assessment: 56 YOM on chronic Coumadin for atrial fibrillation and history of PE admitted in acute on chronic respiratory failure with presumed HCAP and sub-therapeutic INR. Home dose per patient reported as 2.5mg  daily.  INR today is now subtherapeutic at 1.58. Dose was held last night d/t NPO status. Speech to re-evaluate today. CBC is stable and no bleeding noted. MD does think parenteral anticoagulation is warranted at this time.   Goal of Therapy:  INR 2-3   Plan:  1. Re-attempt Coumadin 5mg  PO x 1 tonight 2. F/u AM INR  Salome Arnt, PharmD, BCPS Pager # [phone] 04/09/2013 10:16 AM

## 2013-04-09 NOTE — Discharge Summary (Signed)
Physician Discharge Summary  Patient ID: Frederick [name] MRN: [id number] DOB/AGE: [birth year] 80 y.o.  Admit date: 04/05/2013 Discharge date: 04/09/2013    Discharge Diagnoses:  Acute on Chronic Respiratory Failure HCAP Pulmonary Edema Acute Exacerbation of COPD Hypercapnia Atrial Fibrillation  Diastolic CHF Hyponatremia  Hypokalemia Protein Calorie Malnutrition Esophageal Stricture with Chronic Dysphagia Anemia Hx of PE E-Coli Bacteremia Diabetes Advanced Dementia Severe Deconditioning  Goals of Care                                                                      DISCHARGE PLAN BY DIAGNOSIS    Acute on Chronic Respiratory Failure HCAP Pulmonary Edema Acute Exacerbation of COPD Hypercapnea   Discharge Plan: -Mandatory QHS BiPAP -will need outpatient BiPAP titration study -solu-medrol for AE COPD until 1/8, convert to prednisone once cleared for swallowing -limit sedating medications with hypercapnea  Atrial Fibrillation  Diastolic CHF  Discharge Plan: -monitor rate -daily lasix -coumadin $RemoveBeforeD'5mg'[id number]$  PO at 1800 1/3, repeat INR in am 1/4 with dose adjustment  -hold cozaar, isordil   Hyponatremia  Hypokalemia  Discharge Plan: -trend electrolytes, replace as indicated  Protein Calorie Malnutrition Esophageal Stricture with Chronic Dysphagia  Discharge Plan: -NPO -MBS per Speech Therapy at [facility] on 1/4  Anemia Hx of PE  Discharge Plan: -trend H/H -no tx unless Hgb <7, active bleed, hemodynamic instability -coumadin as above  E-Coli Bacteremia Stage I Sacral   Discharge Plan: -rocephin until 04/18/12 -sacral protective dressing   Diabetes  Discharge Plan: -SSI, moderate scale -hold long acting insulin in setting of dysphagia, NPO status  Advanced Dementia Severe Deconditioning  Goals of Care   Discharge Plan: -Partial Code:  Re-intubation if necessary, no pressors, CPR or Shock.   -PT as able for conditioning -Ongoing goals of care  recommended                 DISCHARGE SUMMARY   Frederick [name] is a 80 y.o. y/o male with a PMH of GERD, esophageal stricture with chronic dysphagia, bladder cancer, deconditioning / wheelchair bound, COPD and chronic resp failure. Recently discharged from [place] approximately one week prior to admit after prolonged stay (since October 2014), for deconditioning after being hospitalized for UT associated sepsis and respiratory failure. Had been doing well/progressing prior to d/c. Then about 1 week prior to d/c he had a sleep study to see if he needed CPAP. Had been supported on BIPAP nightly up to that time. The split PSG did not show sig apnea and therefore BIPAP was discontinued. No further ABGs recorded. Per family he got progressively lethargic even up to time of d/c on 12/12. Since home he got progressively lethargic to point he had difficulty eating. He was found by wife essentially unresponsive the am of 12/30. On arrival to ER he was hypercarbic and hypoxic requiring intubation and vent support. CXR showed Left sided airspace disease/atx.   Patient was admitted to ICU on mechanical ventilation.  He was treated for HCAP with antibiotics as below. Sputum culture positive for Moraxella catarrhalis, B-lactamase positive.  Blood cultures positive for E-Coli that was sensitive to rocephin. Plan for patient to complete 14 days total antibiotics.   He remained on mechanical ventilation until 1/2 at which time he was successfully extubated.  Patient's ABG reflects resting hypercapnea with pCO2 of 80 on 1/3 while resting comfortably off the ventilator, mild somnolence during day.  He remained hemodynamically stable during admission.  AFib remained rate controlled.  He was aggressively diuresed.   Multiple home medications held upon admit.  He was supported nutritionally with TF while on mechanical ventilation.  Patient has known history of esophageal stricture with chronic dysphagia.  Post extubation, he  was evaluated by Speech Therapy and was recommended for MBS.  Patient is medically cleared for discharge with plan as above.     SIGNIFICANT DIAGNOSTIC STUDIES 12/30 - Admit with acute respiratory failure in setting of acute exacerbation of COPD & HCAP  LINES / TUBES:  OETT 12/30 >>>1/2  OGT 12/30 >>>1/2  Foley 12/30 >>>   CULTURES:  12/30 Sputum >>>Moraxella catarrhalis, B-lactamase +  12/30 Blood >>>E. Coli>>sens rocephin 12/20 Urine >>>mult bacterial morphotypes   ANTIBIOTICS:  Vancomycin 12/30>>>12/31  Cefepime 12/30 >>>1/2  Ceftriaxone 1/2>>>04/18/12   Discharge Exam: General: comfortable on vent  HEENT: EOMi, NCAT  PULM: CTA B lung fields  CV: Irreg irreg, no murmurs appreciated  AB: BS+, soft, nontender  Ext: 2+ pitting LE edema bilaterally  Neuro: awake, one word answers, not oriented to place or situation, follows commands   Filed Vitals:   04/09/13 1400 04/09/13 1410 04/09/13 1500 04/09/13 1544  BP: 125/66  122/74   Pulse: 97  97   Temp:    98 F (36.7 C)  TempSrc:    Oral  Resp: 23  23   Height:      Weight:      SpO2: 100% 100% 99%      Discharge Labs  BMET  Recent Labs Lab 04/05/13 1140 04/06/13 0210 04/07/13 0525 04/08/13 0355 04/09/13 0840  NA 133* 135* 140 141 146  K 4.1 3.6* 3.2* 3.6* 3.1*  CL 82* 88* 91* 93* 93*  CO2 >45* 39* 39* 39* 45*  GLUCOSE 180* 61* 122* 110* 115*  BUN 21 21 24* 27* 25*  CREATININE 0.96 0.81 0.85 0.77 0.78  CALCIUM 8.9 8.4 8.8 8.6 8.8    CBC  Recent Labs Lab 04/07/13 0525 04/08/13 0355 04/09/13 0840  HGB 9.3* 8.6* 9.0*  HCT 29.5* 27.6* 29.2*  WBC 22.6* 21.9* 19.2*  PLT 565* 552* 562*    Anti-Coagulation  Recent Labs Lab 04/05/13 1140 04/06/13 0320 04/07/13 0525 04/08/13 0355 04/09/13 0335  INR 1.42 1.59* 2.76* 2.28* 1.58*    Discharge Orders   Future Orders Complete By Expires   Call MD for:  difficulty breathing, headache or visual disturbances  As directed    Call MD for:   persistant dizziness or light-headedness  As directed    Call MD for:  persistant nausea and vomiting  As directed    Call MD for:  severe uncontrolled pain  As directed    Call MD for:  temperature >100.4  As directed    Discharge instructions  As directed    Comments:     1. Continue Speech Therapy efforts at [facility].  MBS recommended 2. Coumadin $RemoveBef'5mg'SInhgYnxJe$  PO at 1800 on 1/3, repeat INR in am 1/4 with dose adjustment per pharmacy 3. Continue Rocephin throught 1/12   Increase activity slowly  As directed       Follow-up Information   Follow up with [facility], MD. (Post discharge from [facility]. )    Specialty:  Internal Medicine   Contact information:   [place]  [id number] [phone]         Medication List    STOP taking these medications       CELEBREX 100 MG capsule  Generic drug:  celecoxib     chlordiazePOXIDE 5 MG capsule  Commonly known as:  LIBRIUM     dronabinol 2.5 MG capsule  Commonly known as:  MARINOL     DULoxetine 30 MG capsule  Commonly known as:  CYMBALTA     famotidine 20 MG tablet  Commonly known as:  PEPCID     guaiFENesin 100 MG/5ML liquid  Commonly known as:  ROBITUSSIN     insulin detemir 100 UNIT/ML injection  Commonly known as:  LEVEMIR     isosorbide dinitrate 10 MG tablet  Commonly known as:  ISORDIL     losartan 50 MG tablet  Commonly known as:  COZAAR     nystatin 100000 UNIT/GM Powd     omeprazole 20 MG capsule  Commonly known as:  PRILOSEC     PROVIGIL 200 MG tablet  Generic drug:  modafinil      TAKE these medications       acetaminophen 325 MG tablet  Commonly known as:  TYLENOL  Take 650 mg by mouth every 6 (six) hours as needed for mild pain or fever.     antiseptic oral rinse Liqd  15 mLs by Mouth Rinse route QID.     dextrose 5 % SOLN 50 mL with cefTRIAXone 1 G SOLR 1 g  Inject 1 g into the vein daily.     furosemide 40 MG tablet  Commonly known as:   LASIX  Take 40 mg by mouth daily.     insulin aspart 100 UNIT/ML injection  Commonly known as:  novoLOG  - Inject 0-20 Units into the skin every 4 (four) hours. CBG < 70: implement hypoglycemia protocol  - CBG 70 - 120: 0 units  - CBG 121 - 150: 2 units  - CBG 151 - 200: 3 units  - CBG 201 - 250: 5 units  - CBG 251 - 300: 8 units  - CBG 301 - 350: 11 units  - CBG 351 - 400: 15 units  - CBG > 400: call MD and obtain STAT lab verification     ipratropium-albuterol 0.5-2.5 (3) MG/3ML Soln  Commonly known as:  DUONEB  Take 3 mLs by nebulization every 6 (six) hours.     methylPREDNISolone sodium succinate 40 mg/mL injection  Commonly known as:  SOLU-MEDROL  Inject 0.63 mLs (25 mg total) into the vein daily.     multivitamin with minerals Tabs tablet  Take 1 tablet by mouth daily.     mupirocin ointment 2 %  Commonly known as:  BACTROBAN  Place into the nose 2 (two) times daily. For 7 days     pantoprazole 40 MG tablet  Commonly known as:  PROTONIX  Take 40 mg by mouth daily.     senna 8.6 MG Tabs tablet  Commonly known as:  SENOKOT  Take 1 tablet by mouth 2 (two) times daily.     warfarin 5 MG tablet  Commonly known as:  COUMADIN  Take 1 tablet (5 mg total) by mouth one time only at 6 PM.        Disposition: [facility] for further rehab efforts.   Discharged Condition: Frederick [name] has met maximum benefit of inpatient care and is medically stable and cleared for discharge.  Patient is pending follow up as above.  Time spent on disposition:  Greater than 35 minutes.   Signed: Noe Gens, NP-C [facility] Pgr: [phone] Office: [phone]

## 2013-04-09 NOTE — Progress Notes (Signed)
CRITICAL VALUE ALERT  Critical value received:  CBG= 20 Date of notification: 04-09-2013 Time of notification: 0430 Nurse who received alert:  Rip Harbour  "1 Amp Dextrose 50 given"

## 2013-04-09 NOTE — Progress Notes (Addendum)
PULMONARY / CRITICAL CARE MEDICINE  Name: Frederick [name] MRN: [id number] DOB: [birth year]    ADMISSION DATE:  04/05/2013 CONSULTATION DATE:  12/30  REFERRING SERVICE:  EDP PRIMARY SERVICE: PCCM   CHIEF COMPLAINT:  Acute respiratory failure   BRIEF PATIENT DESCRIPTION: 80 yo with bladder cancer, deconditioning / wheelchair bound, COPD and chronic resp failure.  Recently discharged from [place]. Admitted to [facility] 12/30 with acute on chronic respiratory failure in setting of AECOPD and HCAP.   SIGNIFICANT EVENTS / STUDIES:   LINES / TUBES: OETT 12/30 >>>1/2 OGT 12/30 >>>1/2 Foley 12/30 >>>  CULTURES: 12/30  Sputum >>>Moraxella catarrhalis, B-lactamase + 12/30  Blood >>>E. coli 12/20  Urine >>>mult bacterial morphotypes  ANTIBIOTICS: Vancomycin 12/30>>>12/31 Cefepime 12/30 >>>1/2 Ceftriaxone 1/2    SUBJECTIVE:  Extubated 1/2 AM   VITAL SIGNS: Temp:  [98 F (36.7 C)-98.7 F (37.1 C)] 98.5 F (36.9 C) (01/03 0744) Pulse Rate:  [76-104] 88 (01/03 0700) Resp:  [10-28] 14 (01/03 0700) BP: (78-130)/(48-95) 108/66 mmHg (01/03 0700) SpO2:  [91 %-100 %] 100 % (01/03 0700) FiO2 (%):  [40 %] 40 % (01/02 1209) Weight:  [92.4 kg (203 lb 11.3 oz)] 92.4 kg (203 lb 11.3 oz) (01/03 0404) HEMODYNAMICS:   VENTILATOR SETTINGS: Vent Mode:  [-] CPAP;PSV FiO2 (%):  [40 %] 40 % PEEP:  [5 cmH20] 5 cmH20 Pressure Support:  [5 cmH20-10 cmH20] 10 cmH20 INTAKE / OUTPUT: Intake/Output     01/02 0701 - 01/03 0700 01/03 0701 - 01/04 0700   I.V. (mL/kg) 494.3 (5.3)    NG/GT 90    IV Piggyback 150    Total Intake(mL/kg) 734.3 (7.9)    Urine (mL/kg/hr) 2365 (1.1)    Total Output 2365     Net -1630.8           PHYSICAL EXAMINATION:  General: comfortable on vent HEENT: EOMi, NCAT PULM: CTA B lung fields CV: Irreg irreg, no murmurs appreciated AB: BS+, soft, nontender Ext: 2+ pitting LE edema bilaterally Neuro: awake, one word answers, not oriented to place or situation, follows  commands  LABS:  CBC  Recent Labs Lab 04/06/13 0210 04/07/13 0525 04/08/13 0355  WBC 26.9* 22.6* 21.9*  HGB 9.7* 9.3* 8.6*  HCT 30.8* 29.5* 27.6*  PLT 507* 565* 552*   Coag's  Recent Labs Lab 04/07/13 0525 04/08/13 0355 04/09/13 0335  INR 2.76* 2.28* 1.58*   BMET  Recent Labs Lab 04/06/13 0210 04/07/13 0525 04/08/13 0355  NA 135* 140 141  K 3.6* 3.2* 3.6*  CL 88* 91* 93*  CO2 39* 39* 39*  BUN 21 24* 27*  CREATININE 0.81 0.85 0.77  GLUCOSE 61* 122* 110*   Electrolytes  Recent Labs Lab 04/06/13 0210 04/07/13 0525 04/08/13 0355  CALCIUM 8.4 8.8 8.6   Sepsis Markers  Recent Labs Lab 04/05/13 1145 04/05/13 1811 04/05/13 1813 04/06/13 0152 04/06/13 0153 04/07/13 0525  LATICACIDVEN 1.00 2.9*  --   --  2.8*  --   PROCALCITON  --   --  0.26 0.30  --  0.33   ABG  Recent Labs Lab 04/05/13 1244 04/07/13 0259  PHART 7.373 7.473*  PCO2ART 76.5* 58.0*  PO2ART 261.0* 100.0   Liver Enzymes  Recent Labs Lab 04/05/13 1140  AST 17  ALT 11  ALKPHOS 154*  BILITOT <0.2*  ALBUMIN 1.7*   Cardiac Enzymes  Recent Labs Lab 04/05/13 1812 04/06/13 0152  TROPONINI <0.30 <0.30   Glucose  Recent Labs Lab 04/08/13 1525 04/08/13 1941 04/08/13 2345 04/09/13  2841 04/09/13 0453 04/09/13 0709  GLUCAP 120* 120* 147* 20* 133* 97    CXR:  12/30 >>> Hardware in good position, airspace disease L 12/31>>> bilateral pulm edema, small left pleural effusion, stable lines/tubes 1/1>>> persistent Left basilar opacification effusion vs atelectasis, cannot exclude infection, likely improving bilateral perihilar edema 1/2 >>> persistent LLL infiltrate c/w PNA, possible asymmetric pulm edema, stable lines/tubes  ASSESSMENT / PLAN:  PULMONARY A:  Acute on chronic respiratory failure due to HCAP and pulm edema Baseline COPD, ?flare? Resting daytime hypercapnea (pCO2 80 on 1/3 when resting comfortably off vent) with somonolence during the day. Wife noted he  did much better with qHS BIPAP in terms of alertness during the day P:   ABG now to assess for resting hypercarbia (ie, does he need home BIPAP) BIPAP qHS Will need outpatient BIPAP titration study Solumedrol for AE COPD (would taper off by 1/8), convert to prednisone when able to swallow   CARDIOVASCULAR A:  Hemodynamically stable. Atrial fibrillation rate controlled  CHF likely diastolic, currently still slightly volume up P:  Goal MAP>60 Tele Lasix daily   Recent Labs Lab 04/05/13 1140 04/06/13 0210 04/07/13 0525 04/08/13 0355  CREATININE 0.96 0.81 0.85 0.77     RENAL A: Hyponatremia > resolved Hypokalemia -->  P:   BMET today   Recent Labs Lab 04/05/13 1140 04/06/13 0210 04/07/13 0525 04/08/13 0355  NA 133* 135* 140 141     Recent Labs Lab 04/05/13 1140 04/06/13 0210 04/07/13 0525 04/08/13 0355  K 4.1 3.6* 3.2* 3.6*    GASTROINTESTINAL A:   Protein calorie malnutrition Esophageal stricture with chronic dysphagia. GI Px P:  SLP consult today   HEMATOLOGIC A:   Anemia of chronic disease.  H/o recent pulmonary embolus. VTE Px is not required - full dose anticoagulation. P:  Coumadin per Pharmacy.   Recent Labs Lab 04/05/13 1140 04/06/13 0320 04/07/13 0525 04/08/13 0355 04/09/13 0335  INR 1.42 1.59* 2.76* 2.28* 1.58*    INFECTIOUS A:   HCAP. E.coli  bacteremia  P:   Ceftriaxone for 14 day course (d/c 1/12)  ENDOCRINE A:   DM w/ hyperglycemia. P:   SSI  NEUROLOGIC A:   Baseline advanced dementia Severe deconditioning, needs total care. P:   Goal RASS 0 to -1 Cont Fentanyl  gtt Daily WUA   Code status: Lengthy conversation with wife on 1/2.  She has unrealistic goals of care considering what appears to be advance dementia.  At this point she wants to re-intubate if necessary, but no pressors, CPR, shocks.    Disposition: [facility] bed available, ready from our standpoint for transfer.   Simonne Maffucci Internal Medicine Resident  PGY3   04/09/2013, 7:47 AM   Attending:  I have seen and examined the patient with nurse practitioner/resident and agree with the note above.   CC time 45 minutes.  Jillyn Hidden PCCM Pager: [phone] Cell: [phone] If no response, call [phone]

## 2013-04-09 NOTE — Evaluation (Signed)
Clinical/Bedside Swallow Evaluation Patient Details  Name: Frederick [name] MRN: [id number] Date of Birth: 1929-05-21  Today's Date: 04/09/2013 Time: 1740-8144 SLP Time Calculation (min): 18 min  Past Medical History:  Past Medical History  Diagnosis Date  . Diabetes mellitus   . Hypertension   . Hypercholesterolemia   . Esophageal stricture   . Pulmonary embolism 2008    chronic coumadin   . Coronary artery disease     DR. BERRY IS PT'S CARDIOLOGIST  . Atrial fibrillation     CHRONIC COUMADIN-pemanent atrial fib  . Anxiety     SINCE 1975   . Depression     SINCE 1975    . Shortness of breath     NOT SURE WHAT CAUSES SOB - BUT HE IS NOT ABLE TO BE ACTIVE - AND IS SOB WITH ANY ACTIVITY - USES OXYGEN 2 L NASAL CANNULA - ALL THE TIME -EXCEPT WHEN IN SHOWER OR CHANGING CLOTHES on home 02  . Peripheral vascular disease     TOLD SOME SMALL AMOUNT OF BLOCKAGE IN LEG WHEN HEART CATH DONE 2008  . Bladder tumor     HAS HAD HEMATURIA OFF AND ON - HAS FOLEY CATH THAT WAS PLACED JUNE 20TH, 2014  . GERD (gastroesophageal reflux disease)   . Edema     FEET AND LEGS MOST DAYS - SOMETIMES WEARS COMPRESSION HOSE  . Cancer     SKIN CANCERS  . Arthritis     S/P BILATERAL TOTAL KNEE REPLACEMENTS - BUT BOTH KNEES PAINFUL AND JOINTS WORN OUT; BAD RIGHT HIP - BUT NOT A CANDIDATE FOR HIP PREPLACMENT;  HAS SEVERE LOWER  BACK AND LEG PAIN - HAS SPINAL STENOSIS AND BULGING DISC; CURVATURE OF UPPER SPINE - UNABLE TO LAY HIS HEAD FLAT.  Marland Kitchen Pneumonia 2005  . Difficult intubation     DUE TO LIMITED NECK FLEXION AND SHORT NECK--ANESTHESIA RECORD FROM 2007 VATS SURGERY AT [facility] OBTAINED AND ON PT'S CHART.  Marland Kitchen Problems with hearing     WEARS BILATERAL HEARING AIDS   Past Surgical History:  Past Surgical History  Procedure Laterality Date  . Shoulder surgery      bilateral  . Pancreas surgery  2001    TUMOR REMOVED FROM PANCREAS AND SPLEEN ALSO REMOVED  . Total knee arthroplasty      bilateral  . Lung  surgery  2007    non-cancer  VIDEO ASSISTED THORCOTOMY TO REMOVE FLUID / DECORTICATION. DR. Arlyce Dice  . Basil cell    . Cardiac catheterization  2008    totally occluded nondominant LCX wth mod. ostial RCA disease and nl. EF  . Esophageal stretch    . Skin cancer removed from left side of head - required skin graft from left leg    . Cartilage repair 198- left knee    . Joint replacement    . Transurethral resection of bladder tumor with gyrus (turbt-gyrus) N/A 11/03/2012    Procedure: TRANSURETHRAL RESECTION OF BLADDER TUMOR WITH GYRUS (TURBT-GYRUS);  Surgeon: Molli Hazard, MD;  Location: [facility];  Service: Urology;  Laterality: N/A;  . Cystoscopy/retrograde/ureteroscopy  11/03/2012    Procedure: CYSTOSCOPY/RETROGRADE/ LEFT URETEROSCOPY AND STENT PLACEMENT;  Surgeon: Molli Hazard, MD;  Location: [facility];  Service: Urology;;  . Paulino Rily N/A 11/03/2012    Procedure: CYSTOGRAM;  Surgeon: Molli Hazard, MD;  Location: [facility];  Service: Urology;  Laterality: N/A;   HPI:  80 yo with h/o GERD, esophageal stricture with chronic dysphagia, bladder cancer, deconditioning /  wheelchair bound, COPD and chronic resp failure. Recently discharged from [place]. Admitted to [facility] 12/30 with acute on chronic respiratory failure in setting of AECOPD and HCAP. Intubated 12/30-1/2. Most recent mbs complete 11/26/12 and indicated a severe esophageal dysphagia with pharyngeal components. Placed on a dysphagia 1 diet with nectar thick liquids with known risk of aspiration.    Assessment / Plan / Recommendation Clinical Impression  Patient with known h/o severe dysphagia. Per neice present, repeat swallow evaluation complete at [facility] and diet advanced to regular with thin liquids. Patient now s/p intubation for recurrent PNA. Patient alert, able to orally transit bolus and initiate a swallow. Overt indication of aspiration noted with thin liquids only however suspect that dysphagia more severe than  patient presenting. Recommend NPO with repeat MBS 1/4. Educated patient and neice regarding current aspiration risks and plan.     Aspiration Risk  Severe    Diet Recommendation NPO   Medication Administration: Via alternative means    Other  Recommendations Recommended Consults: MBS Oral Care Recommendations: Oral care Q4 per protocol   Follow Up Recommendations   (TBD)    Frequency and Duration        Pertinent Vitals/Pain none     Swallow Study    General HPI: 80 yo with h/o GERD, esophageal stricture with chronic dysphagia, bladder cancer, deconditioning / wheelchair bound, COPD and chronic resp failure. Recently discharged from [place]. Admitted to [facility] 12/30 with acute on chronic respiratory failure in setting of AECOPD and HCAP. Intubated 12/30-1/2. Most recent mbs complete 11/26/12 and indicated a severe esophageal dysphagia with pharyngeal components. Placed on a dysphagia 1 diet with nectar thick liquids with known risk of aspiration.  Type of Study: Bedside swallow evaluation Previous Swallow Assessment: see HPI Diet Prior to this Study: NPO Temperature Spikes Noted: No Respiratory Status: Nasal cannula History of Recent Intubation: Yes Length of Intubations (days): 3 days Date extubated: 04/08/13 Behavior/Cognition: Alert;Cooperative;Pleasant mood Oral Cavity - Dentition: Adequate natural dentition Self-Feeding Abilities: Able to feed self Patient Positioning: Upright in bed Baseline Vocal Quality: Clear Volitional Cough: Strong Volitional Swallow: Able to elicit    Oral/Motor/Sensory Function Overall Oral Motor/Sensory Function: Appears within functional limits for tasks assessed   Ice Chips Ice chips: Within functional limits Presentation: Spoon   Thin Liquid Thin Liquid: Impaired Presentation: Cup Pharyngeal  Phase Impairments: Suspected delayed Swallow;Cough - Immediate;Throat Clearing - Delayed    Nectar Thick Nectar Thick Liquid: Not tested   Honey Thick  Honey Thick Liquid: Not tested   Puree Puree: Within functional limits Presentation: [place] MA, CCC-SLP [phone]  Solid: Not tested       Gabriel Rainwater Meryl 04/09/2013,2:06 PM

## 2013-04-09 NOTE — Progress Notes (Signed)
   CARE MANAGEMENT NOTE 04/09/2013  Patient:  Frederick [name],Frederick [name]   Account Number:  [id number]  Date Initiated:  04/06/2013  Documentation initiated by:  [facility]  Subjective/Objective Assessment:   Admitted with unresponsive - Intubated.     Action/Plan:   Anticipated DC Date:  04/13/2013   Anticipated DC Plan:  LONG TERM ACUTE CARE (LTAC)      DC Planning Services  CM consult      Choice offered to / List presented to:             Status of service:  Completed, signed off Medicare Important Message given?   (If response is "NO", the following Medicare IM given date fields will be blank) Date Medicare IM given:   Date Additional Medicare IM given:    Discharge Disposition:  LONG TERM ACUTE CARE (LTAC)  Per UR Regulation:  Reviewed for med. necessity/level of care/duration of stay  If discussed at [place] of Stay Meetings, dates discussed:    Comments:  04/09/13 15:15 CM called [facility] to check on bed availability for discharging NP [phone].  CM spoke with Margot Ables who states a bed assignment was already assigned for acceptance Monday.  Bed 318 with Receiving MD Gwynneth Munson and can accept today.  Information called to discharging NP along with instruction to send Discharge  Summary with Patient.  CM met with RN and gave her contact number of [facility]; and request for discharge summary to accompany pt; and instruction to call report to [phone] ext [phone].  RN to call family when arrangements complete.  COBRA to be completed by RN for transfer.  No other CM needs were communicated.  Mariane Masters, BSN, CM [phone].  ContactGohan, [place] [phone]   [phone]                  [place] [phone]  04-06-13 1:15pm Luz Lex, RNBSN [phone] Home for about a week from [place].  Family understands will need rehab faciltiy again.  Remains on vent.  Asked [facility] to check benefit days for ltach.

## 2013-04-10 NOTE — Discharge Summary (Signed)
Attending:  I have seen and examined the patient with nurse practitioner/resident and agree with the note above.  Jillyn Hidden PCCM Pager: [phone] Cell: [phone] If no response, call [phone]

## 2013-04-11 LAB — CULTURE, BLOOD (ROUTINE X 2): Culture: NO GROWTH

## 2013-06-30 ENCOUNTER — Ambulatory Visit (INDEPENDENT_AMBULATORY_CARE_PROVIDER_SITE_OTHER): Payer: Medicare Other | Admitting: Internal Medicine

## 2013-06-30 ENCOUNTER — Encounter: Payer: Self-pay | Admitting: Internal Medicine

## 2013-06-30 VITALS — BP 142/80 | HR 78 | Temp 98.2°F

## 2013-06-30 DIAGNOSIS — J9610 Chronic respiratory failure, unspecified whether with hypoxia or hypercapnia: Secondary | ICD-10-CM

## 2013-06-30 NOTE — Progress Notes (Signed)
Subjective:    Patient ID: Frederick [name], male    DOB: 03-17-1930  MRN: [id number]    Brief patient profile:  79 yowm never smoker remotely seen in pulmonary for R side effusion s/p pleurodesis by Arlyce Dice 07/2004        History of Present Illness  10/12/2012 1st pulmonary ov / 02 dep since 2008 p PE Chief Complaint  Patient presents with  . Follow-up    Referred per Dr Jasmine December. Pt needs pulmonary clearance for bladder tumor removal.   sob off 02 in shower. On 02 not limited by breathing but extremely inactive Sleeps in recliner due to spine chronically  Chronic sym leg swelling x years no calf pain, no recent dopplers Needs to be off coumadin for bladder surgery due to ongoing hematuria from bladder tumor rec Cleared for bladder surgery    06/30/2013 f/u ov/Nakeda Lebron re: chronic resp failure p bladder surgery ? Largely related to aspiration  Chief Complaint  Patient presents with  . Follow-up    Pt reports that his breathing is overall doing well. He c/o cough and congestion. His cough is occ prod with clear to yellow sputum.  He is needing clearance for surgery to remove bladder tumor.   still in snf [place] x Apr 28 2013  Puree food, thickened liquids doing better with swallowing but still some rattling cough and chest congestion   No obvious daytime variabilty or assoc sob at rest in w/c or  cp or chest tightness, subjective wheeze overt sinus or hb symptoms. No unusual exp hx or h/o childhood pna/ asthma or knowledge of premature birth.    Sleeping ok in recliner without nocturnal  or early am exacerbation  of respiratory  c/o's or need for noct saba. 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)  PLEURAL EFFUSION, LEFT (ICD-511.9) 2/07  - Left  Fibrothorax requiring decortication 08/04/04  OSTEOARTHRITIS (ICD-715.90)  DIABETES MELLITUS, TYPE II, ON INSULIN (ICD-250.00)  GERD (ICD-530.81)....................................................................................Marland KitchenDeatra Ina  - EGD 06/22/04 with stricture  FOREIGN BODY IN ESOPHAGUS (ICD-935.1)  COLONIC POLYPS (ICD-211.3)  DIVERTICULOSIS OF COLON (ICD-562.10)  GASTRITIS (ICD-535.50)  MORBID OBESITY  - PFTs and 04/13/08 FEV1 54%, ratio 76% BRB 16% DLCO 66% > c/w restrictive changes from obesity   Family History:   ht dz mother and father  melanoma in father    Social History:   quit smoking 1952         Objective:   Physical Exam   W/c bound hoarse wm nad on 2lpm   06/30/2013       Not able to stand  Wt Readings from Last 3 Encounters:  10/12/12 223 lb (101.152 kg)  03/03/11 222 lb (100.699 kg)  04/13/08 222 lb (100.699 kg)    HEENT: nl dentition, turbinates, and orophanx. Nl external ear canals without cough reflex   NECK :  without JVD/Nodes/TM/ nl carotid upstrokes bilaterally   LUNGS: no acc muscle use, minimal insp and exp rhonchi, poor cough mechanics   CV:  RRR  no s3 or murmur or increase in P2,  1 Plus pitting edema both lower ext  ABD:  soft and nontender with nl excursion in the supine position. No bruits or organomegaly, bowel sounds nl  MS:  warm without deformities, calf tenderness, cyanosis or clubbing  SKIN: warm and dry without  lesions    NEURO:  Variably alert, minimally verbal    pcxr 04/07/13 1. Persistent left lower lobe infiltrate most consistent with  pneumonia. Asymmetric pulmonary edema cannot be excluded. Left  pleural effusion.  2. Stable line and tube positions.  3. Stable cardiomegaly. Normal pulmonary vascularity . Marland Kitchen      Assessment & Plan:

## 2013-06-30 NOTE — Patient Instructions (Signed)
The book you need to read is called "Being Mortal" and discusses almost the exact problem you are being faced with here.   The risk of more complications is very high and needs to be weighed against what symptoms we would correct with surgery vs alternatives to surgery like palliative care

## 2013-07-01 NOTE — Assessment & Plan Note (Addendum)
-   02 dep 2lpm since 2008 24/7   Since he was last cleared here for bladder surgery, although he did survive the surgery and apparently uneventful first week post op, it's all been downhill since with recurrent asp//HCAP/ variable AMS and debilitation to where he is bed to w/c and has a very poor quality of life and persistent bilateral pulmonary infiltrates with poor cough mechanics at baseline.  One could expect more of the same I'm afraid if he undergoes any further surgery.  I had an extended discussion with the patient today lasting 15 to 20 minutes of a 25 minute visit on the following issues:   Clearly the high likelihood of prolonging suffering from pulmonary interventions for further post op complications vastly outweighs any reasonable chance of benefit from offering anything else because medical science has done all it can here to restore health.  Therefore  I don't have any additional recs  except to consider hospice sooner rather than later - paradoxically many patients do better once the focus is on symptoms rather than interventions that are intended to improve their survival but end of backfiring in this population.  I note that he is already NCB but not DNI but I would not favor intubating him at all unless fm ready to accept Trach/PEG/ [facility] outcome- Pulmonary f/u is prn

## 2013-07-06 ENCOUNTER — Encounter: Payer: Self-pay | Admitting: Cardiology

## 2013-07-06 ENCOUNTER — Ambulatory Visit (INDEPENDENT_AMBULATORY_CARE_PROVIDER_SITE_OTHER): Payer: Medicare Other | Admitting: Cardiology

## 2013-07-06 VITALS — BP 150/80 | HR 109 | Ht 68.0 in | Wt 177.0 lb

## 2013-07-06 DIAGNOSIS — Z7901 Long term (current) use of anticoagulants: Secondary | ICD-10-CM

## 2013-07-06 DIAGNOSIS — I482 Chronic atrial fibrillation, unspecified: Secondary | ICD-10-CM

## 2013-07-06 DIAGNOSIS — I4891 Unspecified atrial fibrillation: Secondary | ICD-10-CM

## 2013-07-06 DIAGNOSIS — I2510 Atherosclerotic heart disease of native coronary artery without angina pectoris: Secondary | ICD-10-CM

## 2013-07-06 MED ORDER — METOPROLOL TARTRATE 25 MG PO TABS: 12.5000 mg | ORAL_TABLET | Freq: Two times a day (BID) | ORAL | Status: DC

## 2013-07-06 MED ORDER — METOPROLOL TARTRATE 25 MG PO TABS: 12.5000 mg | ORAL_TABLET | Freq: Two times a day (BID) | ORAL | Status: AC

## 2013-07-06 NOTE — Assessment & Plan Note (Signed)
Rate a little high

## 2013-07-06 NOTE — Progress Notes (Signed)
07/06/2013 Frederick [name]   Jan 26, 1930  [id number]  Primary Physicia RAMACHANDRAN,AJITH, MD Primary Cardiologist: Dr Gwenlyn Found  HPI: 80 y/o seen by [place] in the past, s/p remote cath in 2008 showing a total CFX with normal LVF. He has chronic AF. He has had a bladder tumor removed in June 2014 and this was followed by early readmission with respiratory failure and a long hospitalization. He ultimately had a trach and went to [facility]. He is significantly debilitate, chronically in a wheel chair. His urologist has informed him that he has recurrence of his bladders cancer and sent him to his pulmonologist for pre op clearance which was denied. He is here now for cardiology opinion.    Current Outpatient Prescriptions  Medication Sig Dispense Refill  . acetaminophen (TYLENOL) 325 MG tablet Take 650 mg by mouth every 6 (six) hours as needed for mild pain or fever.      Marland Kitchen dexamethasone (DECADRON) 4 MG tablet Take 4 mg by mouth daily.      Marland Kitchen glucagon (GLUCAGEN) 1 MG SOLR injection Inject 1 mg into the vein once as needed for low blood sugar.      Marland Kitchen guaiFENesin (ROBITUSSIN) 100 MG/5ML SOLN Take 15 mLs by mouth every 4 (four) hours as needed for cough or to loosen phlegm.      . insulin glargine (LANTUS) 100 UNIT/ML injection Inject 15 Units into the skin daily.      . insulin lispro (HUMALOG) 100 UNIT/ML injection Take as directed      . ipratropium-albuterol (DUONEB) 0.5-2.5 (3) MG/3ML SOLN Take 3 mLs by nebulization 3 (three) times daily.      Marland Kitchen LORazepam (ATIVAN) 0.5 MG tablet Take 0.5 mg by mouth every 6 (six) hours as needed for anxiety.      . potassium chloride SA (K-DUR,KLOR-CON) 20 MEQ tablet Take 20 mEq by mouth 2 (two) times daily.      . QUEtiapine (SEROQUEL) 25 MG tablet Take 25 mg by mouth at bedtime.      . senna (SENOKOT) 8.6 MG TABS tablet Take 1 tablet by mouth 2 (two) times daily.      Marland Kitchen torsemide (DEMADEX) 20 MG tablet Take 40 mg by mouth daily.      . traMADol (ULTRAM) 50  MG tablet Take 50 mg by mouth every 6 (six) hours as needed.      . warfarin (COUMADIN) 5 MG tablet as directed.      . metoprolol tartrate (LOPRESSOR) 25 MG tablet Take 0.5 tablets (12.5 mg total) by mouth 2 (two) times daily.  180 tablet  3   No current facility-administered medications for this visit.    Allergies  Allergen Reactions  . Hydromorphone Hcl Other (See Comments)    Dilaudid caused Confusion   . Morphine Other (See Comments)    confusion  . Pregabalin Other (See Comments)    lyrica - caused hallucinations    History   Social History  . Marital Status: Married    Spouse Name: N/A    Number of Children: N/A  . Years of Education: N/A   Occupational History  . Retired    Social History Main Topics  . Smoking status: Never Smoker   . Smokeless tobacco: Never Used  . Alcohol Use: No  . Drug Use: No  . Sexual Activity: Not on file   Other Topics Concern  . Not on file   Social History Narrative  . No narrative on file  Review of Systems: General: negative for chills, fever, night sweats or weight changes.  Cardiovascular: negative for chest pain, dyspnea on exertion, edema, orthopnea, palpitations, paroxysmal nocturnal dyspnea or shortness of breath Dermatological: negative for rash Respiratory: negative for cough or wheezing Urologic: negative for hematuria Abdominal: negative for nausea, vomiting, diarrhea, bright red blood per rectum, melena, or hematemesis Neurologic: negative for visual changes, syncope, or dizziness All other systems reviewed and are otherwise negative except as noted above.    Blood pressure 150/80, pulse 109, height 5\' 8"  (1.727 m), weight 177 lb (80.287 kg).  General appearance: alert, cooperative, no distress and morbidly obese Lungs: decreased breath sounds Heart: irregularly irregular rhythm    ASSESSMENT AND PLAN:   Coronary artery disease, last cath 2008 occluded nondom, LCX, wth moderate ostial RCA disease,  normal LV function No angina  Atrial fibrillation, permanent Rate a little high  Warfarin anticoagulation, chronic .    PLAN  From a strictly cardiac standpoint he could probably have surgery. He has known stable CAD, and CAF, although he would need assessment of his LVF. But looking at the pt as a whole he appears to be a very poor candidate for surgery. I will review this with Dr Gwenlyn Found. No further testing at this time. I did add low dose beta blocker.   [facility] KPA-C 07/06/2013 5:56 PM

## 2013-07-06 NOTE — Patient Instructions (Signed)
Start metoprolol 12.5 mg twice a day

## 2013-07-06 NOTE — Assessment & Plan Note (Signed)
No angina 

## 2013-07-13 ENCOUNTER — Encounter: Payer: Self-pay | Admitting: *Deleted

## 2013-07-13 ENCOUNTER — Telehealth: Payer: Self-pay | Admitting: *Deleted

## 2013-07-13 NOTE — Telephone Encounter (Signed)
Dr Gwenlyn Found reviewed the chart and gave clearance to proceed from a cardiac standpoint.  He will need lovenox bridging.  Letter was drafted and faxed to [facility]

## 2013-10-05 DEATH — deceased

## 2013-10-13 IMAGING — CR DG CHEST 2V
2 series · 2 of 2 positions shown · non-contrast
Comparison: 11/15/2012

CLINICAL DATA: Cough and chest congestion.

CHEST - 2 VIEW

[w chest lat]
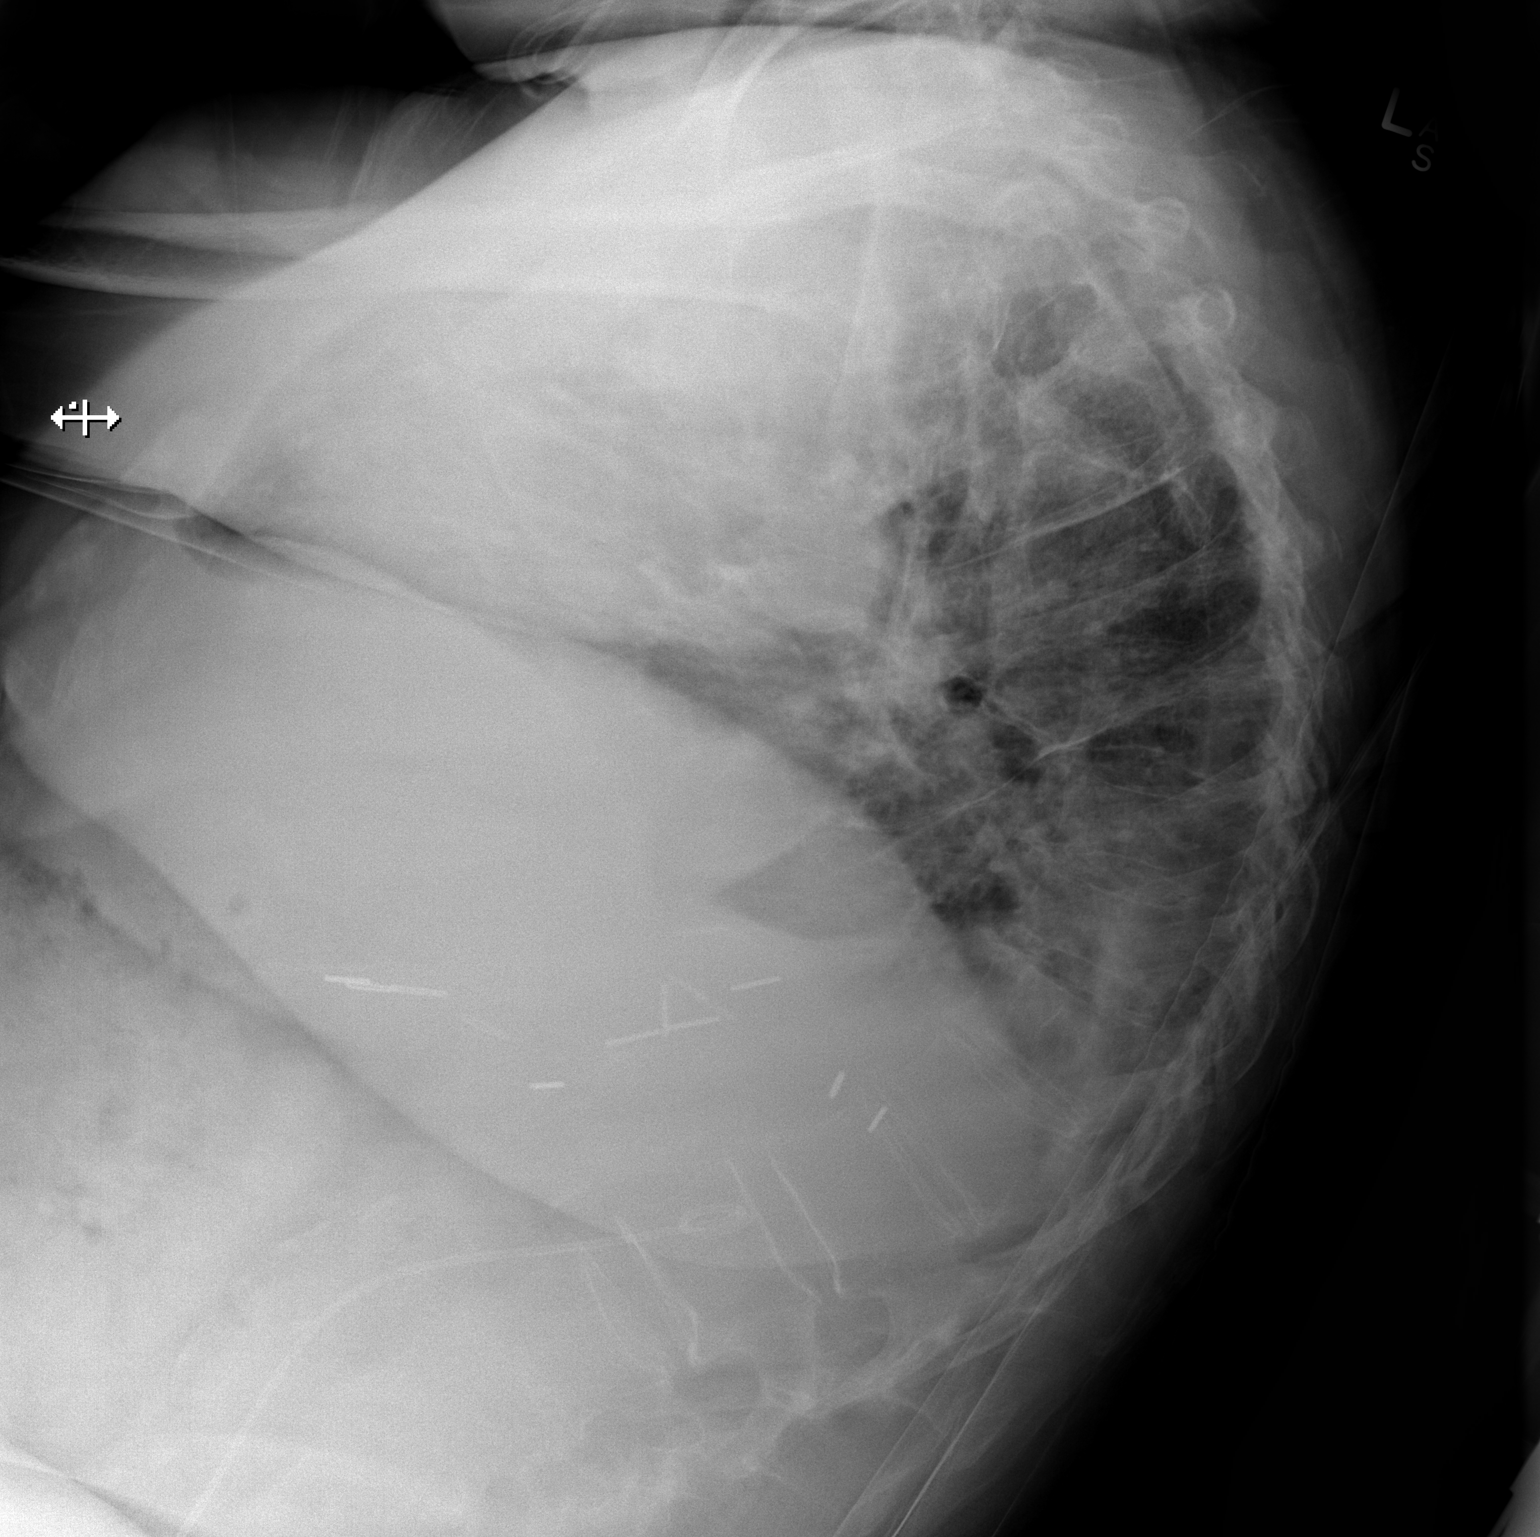

[x chest ap]
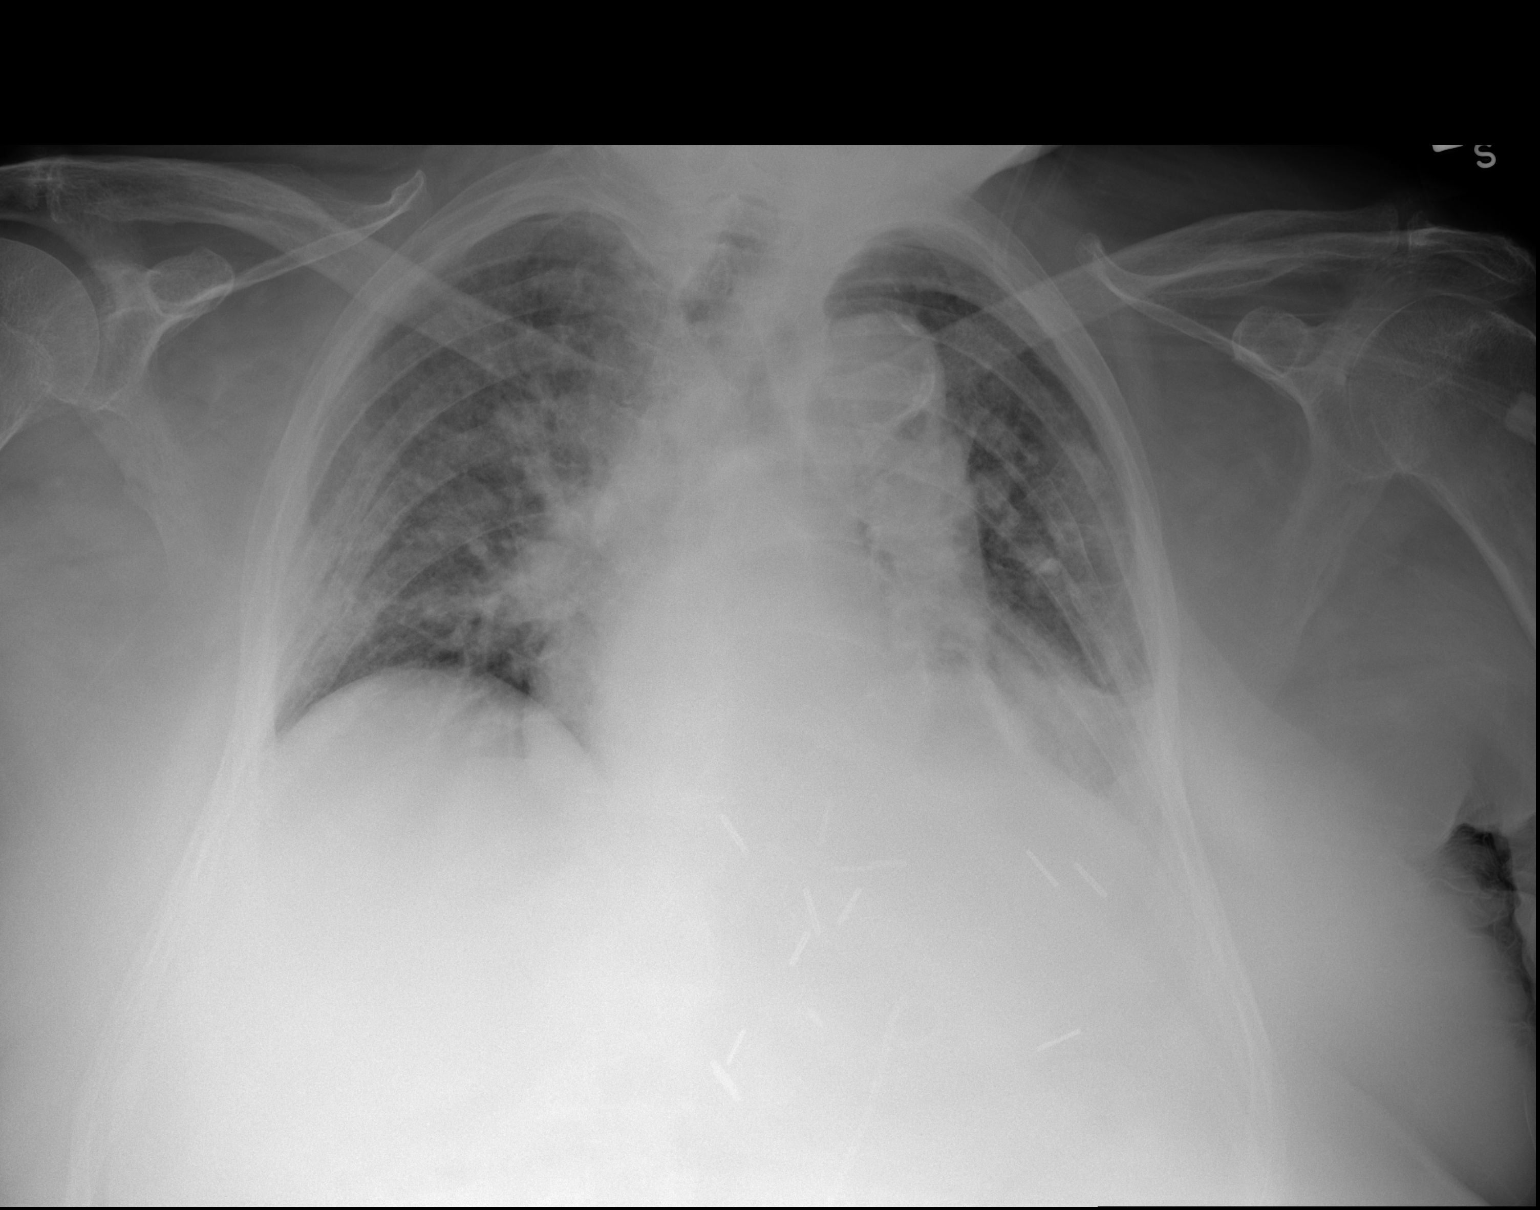

[2 of 2 positions shown; findings below may reference images not displayed]

FINDINGS: Shallow inspiration.  Mild cardiac enlargement with
pulmonary vascular congestion and central interstitial changes
suggesting edema.  This is progressing since the previous study.
There is superimposed infiltration or atelectasis in the left lung
base.  Small left pleural effusion is not excluded.  No
pneumothorax.  Calcified and tortuous aorta.  Degenerative changes
in the spine and shoulders.  Surgical clips in the left upper
quadrant.  Left ureteral stent.
IMPRESSION: Cardiac enlargement with developing pulmonary vascular congestion
and perihilar edema.  Infiltration or atelectasis in the left lung
base.  Possible left pleural effusion.

## 2013-10-17 ENCOUNTER — Telehealth: Payer: Self-pay | Admitting: Cardiovascular Disease

## 2013-10-17 NOTE — Telephone Encounter (Signed)
Mr.Polakowski Passed away on 10-18-13  Thanks

## 2013-10-19 IMAGING — CR DG CHEST 1V PORT
1 series · 1 of 1 positions shown · non-contrast
Comparison: Prior chest x-ray 11/27/2012

CLINICAL DATA: Evaluate endotracheal tube

PORTABLE CHEST - 1 VIEW

[AP]
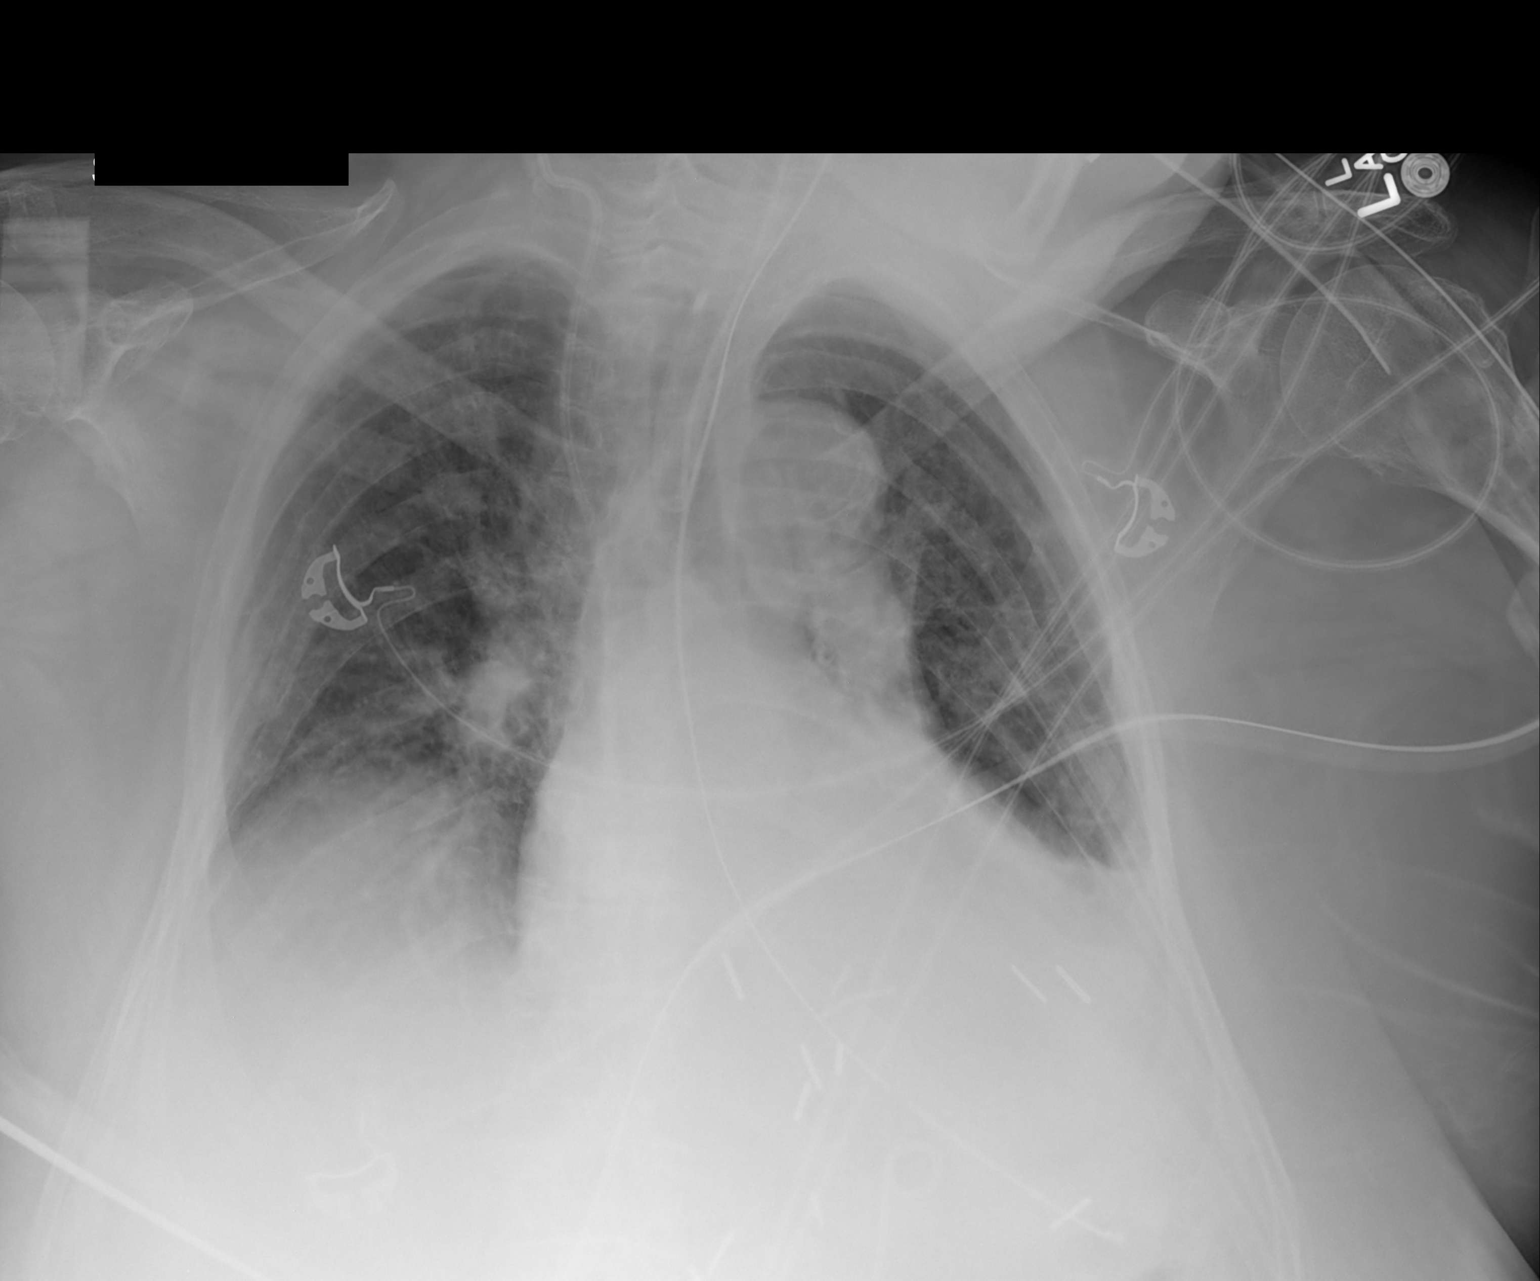

[1 of 1 positions shown; findings below may reference images not displayed]

FINDINGS: The tip of the endotracheal tube is 1.7 cm above the
carina.  Stable position of right IJ central venous catheter with
the tip at the superior cavoatrial junction.  The tip of the
nasogastric tube projects over the stomach. Slightly improved left
lower lobe collapse.  Persistent bilateral layering effusions and
associated bibasilar opacities.  Multiple surgical clips project
over the left upper quadrant and mid epigastric region.
Incompletely imaged left double-J ureteral stent.  Stable
cardiomegaly and aortic atherosclerosis.
IMPRESSION: 1.  Slightly improved left lower lobe atelectasis/collapse.
2.  Persistent left greater than right layering pleural effusions
and associated bibasilar atelectasis versus infiltrate
3.  Stable support apparatus.  The tip of the endotracheal tube is
1.7 cm above the carina.

## 2013-10-24 IMAGING — CR DG CHEST 1V PORT
1 series · 1 of 1 positions shown · non-contrast
Comparison: Chest x-ray 12/02/2012.

CLINICAL DATA: Difficulty breathing.

PORTABLE CHEST - 1 VIEW

[AP]
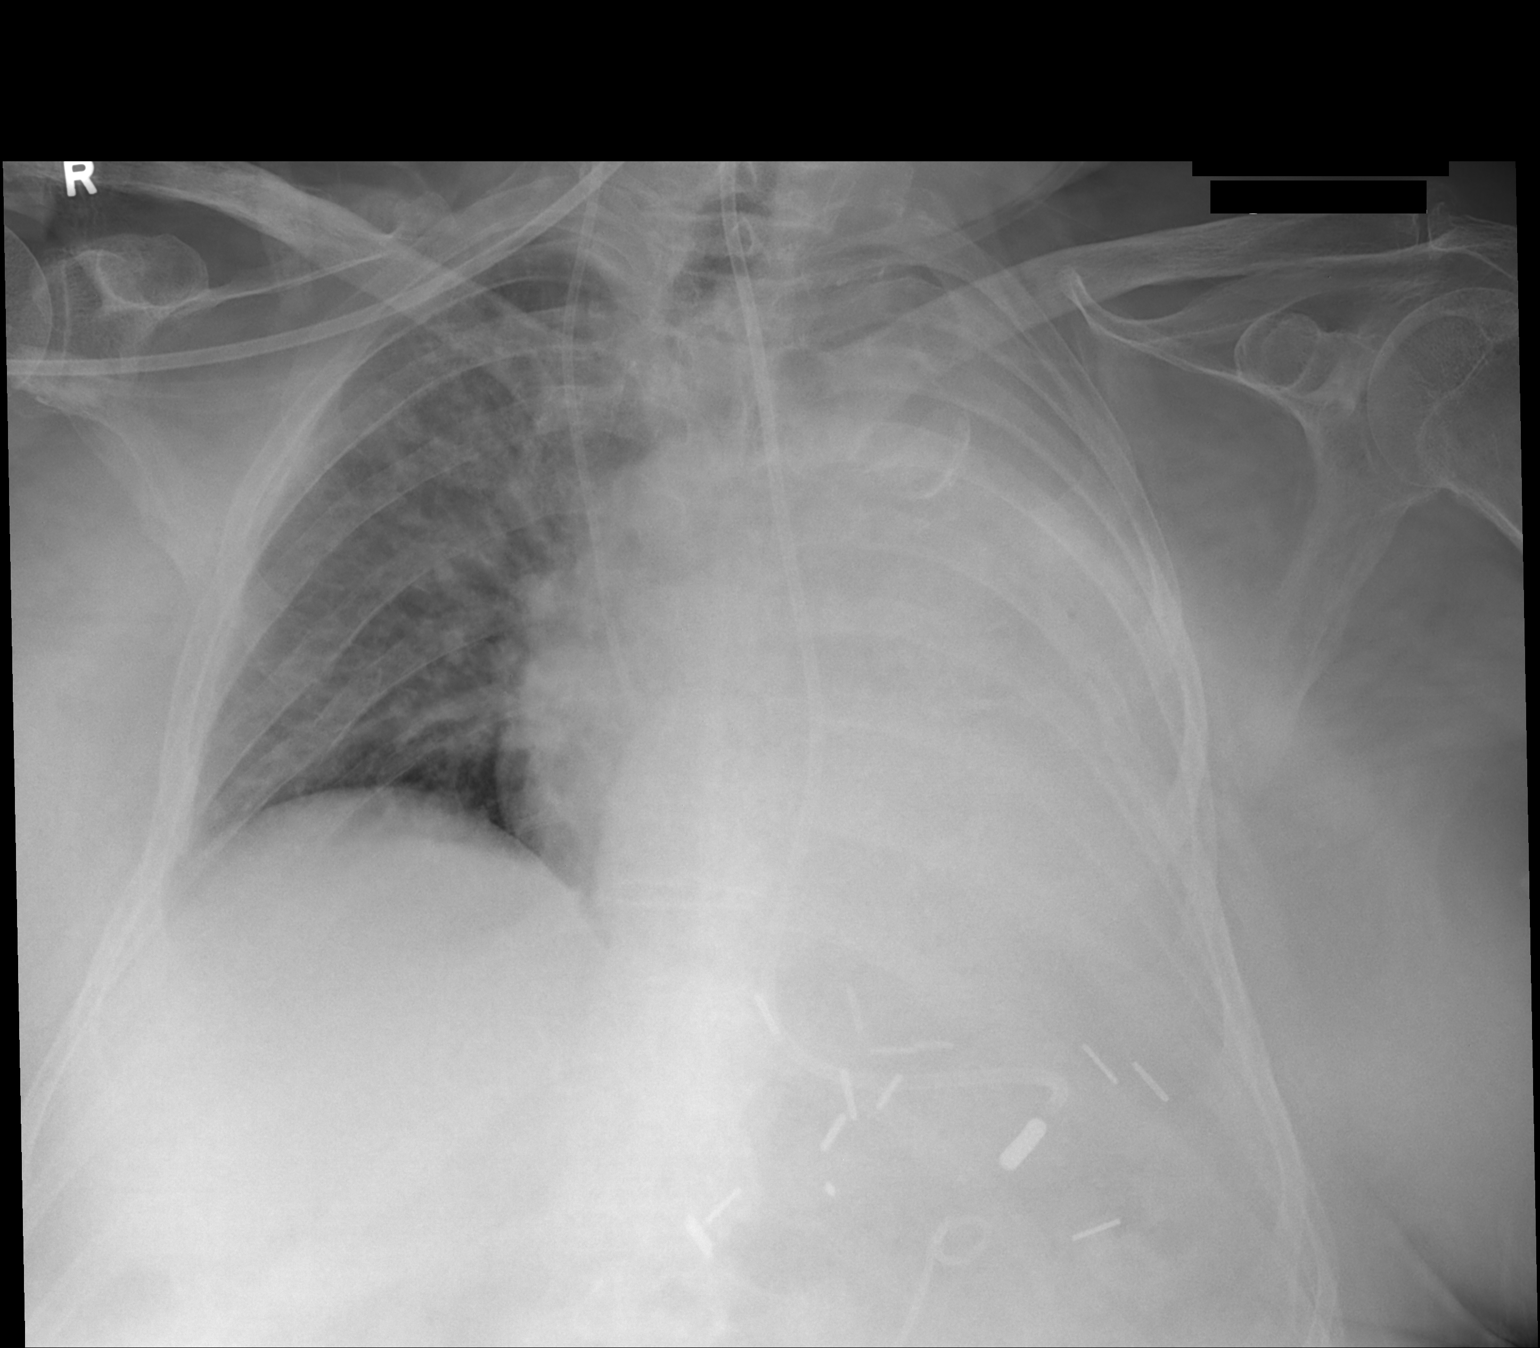

[1 of 1 positions shown; findings below may reference images not displayed]

FINDINGS: Compared to the prior study the patient has been
extubated. There is a right-sided internal jugular central venous
catheter with tip terminating in the superior cavoatrial junction.
A feeding tube in the proximal stomach.  Compared to the prior
examination, there is complete opacification of the left
hemithorax.  There appears to be some leftward shift of
cardiomediastinal structures, favoring the majority of this finding
being related to atelectasis, although underlying airspace
consolidation and/or pleural effusion is not excluded.  Lung
volumes are low.  Crowding of the pulmonary vasculature, without
frank pulmonary edema.  Cardiac silhouette is enlarged.
Mediastinal contour is largely obscured.  Atherosclerosis in the
thoracic aorta.  Multiple surgical clips project over the left
upper quadrant of the abdomen.  What appears represent a left
double-J ureteral stent is noted, but incompletely visualized.
IMPRESSION: 1.  Support apparatus, as above.
2.  Complete opacification of the left hemithorax, new compared to
the prior study, favored to predominately reflect atelectasis.

to nurse Herrejon (for Dr. Taw), who verbally acknowledged
these results.
